# Patient Record
Sex: Male | Born: 1950 | Race: White | Hispanic: No | Smoking: Former smoker
Health system: Southern US, Community
[De-identification: ages and names within clinical notes are randomized; demographics above are authoritative.]

## PROBLEM LIST (undated history)

## (undated) DIAGNOSIS — J449 Chronic obstructive pulmonary disease, unspecified: Secondary | ICD-10-CM

## (undated) DIAGNOSIS — C801 Malignant (primary) neoplasm, unspecified: Secondary | ICD-10-CM

## (undated) DIAGNOSIS — R079 Chest pain, unspecified: Secondary | ICD-10-CM

## (undated) DIAGNOSIS — R7303 Prediabetes: Secondary | ICD-10-CM

## (undated) DIAGNOSIS — B1920 Unspecified viral hepatitis C without hepatic coma: Secondary | ICD-10-CM

## (undated) DIAGNOSIS — R0600 Dyspnea, unspecified: Secondary | ICD-10-CM

## (undated) DIAGNOSIS — R51 Headache: Secondary | ICD-10-CM

## (undated) DIAGNOSIS — I2699 Other pulmonary embolism without acute cor pulmonale: Secondary | ICD-10-CM

## (undated) DIAGNOSIS — F419 Anxiety disorder, unspecified: Secondary | ICD-10-CM

## (undated) DIAGNOSIS — R011 Cardiac murmur, unspecified: Secondary | ICD-10-CM

## (undated) DIAGNOSIS — I10 Essential (primary) hypertension: Secondary | ICD-10-CM

## (undated) DIAGNOSIS — G4730 Sleep apnea, unspecified: Secondary | ICD-10-CM

## (undated) DIAGNOSIS — T7840XA Allergy, unspecified, initial encounter: Secondary | ICD-10-CM

## (undated) DIAGNOSIS — F32A Depression, unspecified: Secondary | ICD-10-CM

## (undated) DIAGNOSIS — M1990 Unspecified osteoarthritis, unspecified site: Secondary | ICD-10-CM

## (undated) DIAGNOSIS — J189 Pneumonia, unspecified organism: Secondary | ICD-10-CM

## (undated) DIAGNOSIS — K219 Gastro-esophageal reflux disease without esophagitis: Secondary | ICD-10-CM

## (undated) HISTORY — DX: Other pulmonary embolism without acute cor pulmonale: I26.99

## (undated) HISTORY — DX: Chest pain, unspecified: R07.9

## (undated) HISTORY — DX: Sleep apnea, unspecified: G47.30

## (undated) HISTORY — DX: Unspecified viral hepatitis C without hepatic coma: B19.20

## (undated) HISTORY — DX: Headache: R51

## (undated) HISTORY — PX: JOINT REPLACEMENT: SHX530

## (undated) HISTORY — DX: Allergy, unspecified, initial encounter: T78.40XA

## (undated) HISTORY — DX: Essential (primary) hypertension: I10

## (undated) HISTORY — DX: Malignant (primary) neoplasm, unspecified: C80.1

## (undated) HISTORY — DX: Pneumonia, unspecified organism: J18.9

## (undated) HISTORY — PX: OTHER SURGICAL HISTORY: SHX169

---

## 2000-10-07 HISTORY — PX: LAMINECTOMY: SHX219

## 2001-01-07 HISTORY — PX: LUMBAR FUSION: SHX111

## 2001-12-28 HISTORY — PX: OTHER SURGICAL HISTORY: SHX169

## 2002-02-17 HISTORY — PX: OTHER SURGICAL HISTORY: SHX169

## 2002-09-02 DIAGNOSIS — J189 Pneumonia, unspecified organism: Secondary | ICD-10-CM

## 2002-09-02 HISTORY — DX: Pneumonia, unspecified organism: J18.9

## 2003-04-18 HISTORY — PX: OTHER SURGICAL HISTORY: SHX169

## 2003-07-24 DIAGNOSIS — B1920 Unspecified viral hepatitis C without hepatic coma: Secondary | ICD-10-CM

## 2003-07-24 HISTORY — DX: Unspecified viral hepatitis C without hepatic coma: B19.20

## 2003-12-12 HISTORY — PX: OTHER SURGICAL HISTORY: SHX169

## 2004-06-18 HISTORY — PX: OTHER SURGICAL HISTORY: SHX169

## 2006-08-27 HISTORY — PX: OTHER SURGICAL HISTORY: SHX169

## 2006-12-07 DIAGNOSIS — I2699 Other pulmonary embolism without acute cor pulmonale: Secondary | ICD-10-CM

## 2006-12-07 HISTORY — DX: Other pulmonary embolism without acute cor pulmonale: I26.99

## 2007-01-22 DIAGNOSIS — R079 Chest pain, unspecified: Secondary | ICD-10-CM

## 2007-01-22 HISTORY — DX: Chest pain, unspecified: R07.9

## 2007-07-02 DIAGNOSIS — R519 Headache, unspecified: Secondary | ICD-10-CM

## 2007-07-02 HISTORY — DX: Headache, unspecified: R51.9

## 2007-07-23 HISTORY — PX: UPPER GI ENDOSCOPY: SHX6162

## 2007-09-07 HISTORY — PX: OTHER SURGICAL HISTORY: SHX169

## 2007-11-20 HISTORY — PX: COLONOSCOPY: SHX174

## 2008-01-29 HISTORY — PX: OTHER SURGICAL HISTORY: SHX169

## 2008-09-19 HISTORY — PX: OTHER SURGICAL HISTORY: SHX169

## 2010-02-20 HISTORY — PX: EPIDURAL BLOCK INJECTION: SHX1516

## 2010-06-11 HISTORY — PX: OTHER SURGICAL HISTORY: SHX169

## 2013-12-02 HISTORY — PX: OTHER SURGICAL HISTORY: SHX169

## 2013-12-23 HISTORY — PX: LOBECTOMY: SHX5089

## 2015-11-12 ENCOUNTER — Other Ambulatory Visit
Admission: RE | Admit: 2015-11-12 | Discharge: 2015-11-12 | Disposition: A | Discharge status: Home / Self Care | Payer: Worker's Compensation | Source: Ambulatory Visit | Attending: Pain Medicine | Admitting: Pain Medicine

## 2015-11-12 ENCOUNTER — Ambulatory Visit
Admission: RE | Admit: 2015-11-12 | Discharge: 2015-11-12 | Disposition: A | Discharge status: Home / Self Care | Payer: Worker's Compensation | Source: Ambulatory Visit | Attending: Pain Medicine | Admitting: Pain Medicine

## 2015-11-12 ENCOUNTER — Other Ambulatory Visit: Payer: Self-pay

## 2015-11-12 ENCOUNTER — Encounter: Payer: Self-pay | Admitting: Pain Medicine

## 2015-11-12 ENCOUNTER — Ambulatory Visit: Payer: Worker's Compensation | Attending: Pain Medicine | Admitting: Pain Medicine

## 2015-11-12 VITALS — BP 144/57 | HR 73 | Temp 98.2°F | Resp 16 | Ht 65.0 in | Wt 218.0 lb

## 2015-11-12 DIAGNOSIS — M539 Dorsopathy, unspecified: Secondary | ICD-10-CM | POA: Diagnosis not present

## 2015-11-12 DIAGNOSIS — Z87898 Personal history of other specified conditions: Secondary | ICD-10-CM | POA: Insufficient documentation

## 2015-11-12 DIAGNOSIS — M545 Low back pain, unspecified: Secondary | ICD-10-CM

## 2015-11-12 DIAGNOSIS — Z9889 Other specified postprocedural states: Secondary | ICD-10-CM | POA: Diagnosis not present

## 2015-11-12 DIAGNOSIS — J449 Chronic obstructive pulmonary disease, unspecified: Secondary | ICD-10-CM | POA: Diagnosis not present

## 2015-11-12 DIAGNOSIS — Z79891 Long term (current) use of opiate analgesic: Secondary | ICD-10-CM | POA: Diagnosis not present

## 2015-11-12 DIAGNOSIS — Z811 Family history of alcohol abuse and dependence: Secondary | ICD-10-CM | POA: Insufficient documentation

## 2015-11-12 DIAGNOSIS — Z79899 Other long term (current) drug therapy: Secondary | ICD-10-CM | POA: Insufficient documentation

## 2015-11-12 DIAGNOSIS — M4806 Spinal stenosis, lumbar region: Secondary | ICD-10-CM | POA: Insufficient documentation

## 2015-11-12 DIAGNOSIS — M5441 Lumbago with sciatica, right side: Secondary | ICD-10-CM

## 2015-11-12 DIAGNOSIS — R0902 Hypoxemia: Secondary | ICD-10-CM | POA: Diagnosis not present

## 2015-11-12 DIAGNOSIS — M5416 Radiculopathy, lumbar region: Secondary | ICD-10-CM | POA: Insufficient documentation

## 2015-11-12 DIAGNOSIS — Z9981 Dependence on supplemental oxygen: Secondary | ICD-10-CM | POA: Insufficient documentation

## 2015-11-12 DIAGNOSIS — I10 Essential (primary) hypertension: Secondary | ICD-10-CM | POA: Diagnosis not present

## 2015-11-12 DIAGNOSIS — M961 Postlaminectomy syndrome, not elsewhere classified: Secondary | ICD-10-CM | POA: Insufficient documentation

## 2015-11-12 DIAGNOSIS — B1920 Unspecified viral hepatitis C without hepatic coma: Secondary | ICD-10-CM | POA: Diagnosis not present

## 2015-11-12 DIAGNOSIS — Z86711 Personal history of pulmonary embolism: Secondary | ICD-10-CM | POA: Diagnosis not present

## 2015-11-12 DIAGNOSIS — Z978 Presence of other specified devices: Secondary | ICD-10-CM | POA: Insufficient documentation

## 2015-11-12 DIAGNOSIS — I708 Atherosclerosis of other arteries: Secondary | ICD-10-CM | POA: Diagnosis not present

## 2015-11-12 DIAGNOSIS — M4326 Fusion of spine, lumbar region: Secondary | ICD-10-CM | POA: Insufficient documentation

## 2015-11-12 DIAGNOSIS — G894 Chronic pain syndrome: Secondary | ICD-10-CM | POA: Diagnosis not present

## 2015-11-12 DIAGNOSIS — M2578 Osteophyte, vertebrae: Secondary | ICD-10-CM | POA: Diagnosis not present

## 2015-11-12 DIAGNOSIS — I700 Atherosclerosis of aorta: Secondary | ICD-10-CM | POA: Insufficient documentation

## 2015-11-12 DIAGNOSIS — Z8659 Personal history of other mental and behavioral disorders: Secondary | ICD-10-CM

## 2015-11-12 DIAGNOSIS — M79604 Pain in right leg: Secondary | ICD-10-CM | POA: Insufficient documentation

## 2015-11-12 DIAGNOSIS — Z462 Encounter for fitting and adjustment of other devices related to nervous system and special senses: Secondary | ICD-10-CM

## 2015-11-12 DIAGNOSIS — N529 Male erectile dysfunction, unspecified: Secondary | ICD-10-CM | POA: Diagnosis not present

## 2015-11-12 DIAGNOSIS — F329 Major depressive disorder, single episode, unspecified: Secondary | ICD-10-CM | POA: Insufficient documentation

## 2015-11-12 DIAGNOSIS — F410 Panic disorder [episodic paroxysmal anxiety] without agoraphobia: Secondary | ICD-10-CM | POA: Diagnosis not present

## 2015-11-12 DIAGNOSIS — Z888 Allergy status to other drugs, medicaments and biological substances status: Secondary | ICD-10-CM | POA: Diagnosis not present

## 2015-11-12 DIAGNOSIS — F32A Depression, unspecified: Secondary | ICD-10-CM

## 2015-11-12 DIAGNOSIS — G8929 Other chronic pain: Secondary | ICD-10-CM | POA: Insufficient documentation

## 2015-11-12 DIAGNOSIS — Z87891 Personal history of nicotine dependence: Secondary | ICD-10-CM | POA: Diagnosis not present

## 2015-11-12 DIAGNOSIS — F1190 Opioid use, unspecified, uncomplicated: Secondary | ICD-10-CM | POA: Insufficient documentation

## 2015-11-12 DIAGNOSIS — Z9689 Presence of other specified functional implants: Secondary | ICD-10-CM | POA: Diagnosis not present

## 2015-11-12 DIAGNOSIS — G4700 Insomnia, unspecified: Secondary | ICD-10-CM | POA: Insufficient documentation

## 2015-11-12 DIAGNOSIS — Z451 Encounter for adjustment and management of infusion pump: Secondary | ICD-10-CM | POA: Insufficient documentation

## 2015-11-12 LAB — COMPREHENSIVE METABOLIC PANEL
ALBUMIN: 4 g/dL (ref 3.5–5.0)
ALT: 18 U/L (ref 17–63)
AST: 20 U/L (ref 15–41)
Alkaline Phosphatase: 51 U/L (ref 38–126)
Anion gap: 8 (ref 5–15)
BILIRUBIN TOTAL: 0.4 mg/dL (ref 0.3–1.2)
BUN: 15 mg/dL (ref 6–20)
CHLORIDE: 101 mmol/L (ref 101–111)
CO2: 29 mmol/L (ref 22–32)
CREATININE: 0.83 mg/dL (ref 0.61–1.24)
Calcium: 8.9 mg/dL (ref 8.9–10.3)
GFR calc Af Amer: >60 mL/min (ref >60)
GLUCOSE: 94 mg/dL (ref 65–99)
POTASSIUM: 3.9 mmol/L (ref 3.5–5.1)
Sodium: 138 mmol/L (ref 135–145)
Total Protein: 7.3 g/dL (ref 6.5–8.1)

## 2015-11-12 LAB — C-REACTIVE PROTEIN: CRP: 0.9 mg/dL (ref <1.0)

## 2015-11-12 LAB — MAGNESIUM: Magnesium: 2 mg/dL (ref 1.7–2.4)

## 2015-11-12 LAB — SEDIMENTATION RATE: SED RATE: 14 mm/h (ref 0–20)

## 2015-11-12 LAB — VITAMIN B12: Vitamin B-12: 1143 pg/mL — ABNORMAL HIGH (ref 180–914)

## 2015-11-12 MED ORDER — PAIN MANAGEMENT IT PUMP REFILL: 1.0000 | Freq: Once | INTRATHECAL | Status: DC

## 2015-11-12 MED ORDER — PAIN MANAGEMENT IT PUMP REFILL: 1.0000 | Freq: Once | INTRATHECAL | 0 refills | Status: DC

## 2015-11-12 NOTE — Patient Instructions (Signed)
Please get your labs and x-rays done as soon as possible. 

## 2015-11-12 NOTE — Progress Notes (Signed)
Patient's Name: Brian Spencer  MRN: [id number]  Referring Provider: No ref. provider found  DOB: 1950-04-30  PCP: No primary care provider on file.  DOS: 11/12/2015  Note by: Kathlen Brunswick. Dossie Arbour, MD  Service setting: Ambulatory outpatient  Specialty: Interventional Pain Management  Location: [facility] (AMB) Pain Management Facility    Patient type: New patient   Primary Reason(s) for Visit: Initial Patient Evaluation CC: Back Pain (lower) and Leg Pain (right  side of leg to the front of the leg)  HPI  Mr. Brian Spencer is a 65 y.o. year old, male patient, who comes today for an initial evaluation. 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 His primarily concern today is the Back Pain (lower) and Leg Pain (right  side of leg to the front of the leg)  Pain Assessment: Self-Reported Pain Score: 5 /10 Clinically the patient looks like a 2/10 Reported level is inconsistent with clinical observations. Information on the proper use of the pain score provided to the patient today. Pain Type: Chronic pain Pain Location: Back Pain Orientation: Lower Pain Descriptors / Indicators: Aching, Constant, Radiating, Sharp (patient has intrathecal pump) Pain Frequency: Constant  Onset and Duration: Sudden,  Started with accident, Date of onset: 05/19/2000, Date of injury: 15 years ago and Present longer than 3 months Cause of pain: Work related accident or event. 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 Severity: No change since onset, NAS-11 at its worse: 10/10, NAS-11 at its best: 5/10, NAS-11 now: 5/10 and NAS-11 on the average: 5/10 Timing: Morning, Afternoon, Night, During activity or exercise, After activity or exercise and After a period of immobility Aggravating Factors: Bending, Lifiting, Motion, Prolonged sitting, Prolonged standing and Surgery made it worse Alleviating Factors: Hot packs, Lying down, Medications, Resting, TENS and Warm showers or baths Associated Problems: Depression, Erectile dysfunction, Fatigue, Inability to concentrate, Sadness, Pain that wakes patient up and Pain that does not allow patient to sleep Quality of Pain: Aching, Constant, Deep, Sharp, Shooting and Stabbing Previous Examinations or Tests: CT scan Previous Treatments: Facet blocks and Morphine pump  The patient comes into the clinics today for the first time for a chronic pain management evaluation. He indicates that worst pain to be that of the lower back with the right side being worst on the left. The second worst pain is that of his right lower extremity where the pain goes down to the ankle and from the ankle down it is non-over the top of the foot into the area of the big toe following an L5 dermatomal distribution. The patient indicates that prior to the surgery he had a "foot drop" which did improve after the surgery. He indicates having had 2 back surgeries the first one he none July 2002 where he had a laminectomy and then on November 2002 he underwent an anterior and posterior fusion. His pain has been successfully managed with a intrathecal  pump. The first pump was implanted in 2008 and has been replaced twice with the last one having been on October 2015. Currently the pump is running preservative-free  hydromorphone 25  Mg/mL at a simple continuous rate of 8.01 mg/day along with clonidine, which the pump has at a concentration of 400 g per mL and runs at 128.17 g per day. This concentration of hydromorphone is 2.5 times higher than the maximum recommended of 10  Mg/mL. In addition, his total daily dose is twice the recommended. His pump is set up with a patient activated dose of 0.500 mg per dose of preservative-free hydromorphone with a total maximum daily dose of 9.477 mg per day. This also delivers preservative-free clonidine 8.00 g per dose with a total of 151.64 mcg/day. The duration of the bolus is 2 minutes with a lockout of 2 hours and a dose restriction interval of 1 per every 2 hours with a maximum of 3 activations per day. The patient indicates that he seldom uses this modality and he would not mind if we took it away. I have explained to him that the downside of use in this modality is that it makes the refill date on predictable.  Today I took the time to provide the patient with information regarding my pain practice. The patient was informed that my practice is divided into two sections: an interventional pain management section, as well as a completely separate and distinct medication management section. The interventional portion of my practice takes place on Tuesdays and Thursdays, while the medication management is conducted on Mondays and Wednesdays. Because of the amount of documentation required on both them, they are kept separated. This means that there is the possibility that the patient may be scheduled for a procedure on Tuesday, while also having a medication management appointment on Wednesday. I have also informed the patient that because of current staffing and facility limitations, I no longer take patients for medication management only. To illustrate the reasons for this, I gave the patient the example of a surgeon and how inappropriate it would be to refer a patient to  his/her practice so that they write for the post-procedure antibiotics on a surgery done by someone else.   The patient was informed that joining my practice means that they are open to any and all interventional therapies. I clarified for the patient that this does not mean that they will be forced to have any procedures done. What it means is that patients looking for a practitioner to simply write for their pain medications and not take advantage of other interventional techniques will be better served by a different practitioner, other than myself. I made it clear that I prefer to spend my time providing those services that I specialize in.  The patient was also made aware of my Comprehensive Pain Management Safety Guidelines where by joining my practice, they limit all of their nerve blocks and joint injections to those done by our practice, for as long as we are retained to manage their care.   Historic Controlled Substance Pharmacotherapy Review  Analgesic: 8.010 mg of preservative-free hydromorphone per day, intrathecally. (This is equivalent to 200.25 mg/day of oral hydromorphone) Medications: Not applicable MME/day: [id number] mg/day Pharmacodynamics: Analgesic Effect: More than 50% Activity Facilitation: Medication(s) allow patient to sit, stand, walk, and do the basic ADLs Perceived Effectiveness: Described as relatively effective, allowing for increase in activities of daily living (ADL) Side-effects or Adverse reactions: None reported Historical Background Evaluation: [place] PDMP:  Five (5) year initial data search conducted. No abnormal patterns identified [place] [employer] Offender Public Information: Non-contributory UDS Results: No UDS results available at this time UDS Interpretation: N/A Medication Assessment Form: Not applicable. Initial evaluation. The patient has not received any medications from our practice Treatment compliance: Not applicable. Initial evaluation Risk  Assessment: Aberrant Behavior: None observed or detected today Opioid Fatal Overdose Risk Factors: None identified today Non-fatal overdose hazard ratio (HR): Calculation deferred Fatal overdose hazard ratio (HR): Calculation deferred Substance Use Disorder (SUD) Risk Level: Pending results of Medical Psychology Evaluation for SUD Opioid Risk Tool (ORT) Score: Total Score: 4 Moderate Risk for SUD (Score between 4-7) Depression Scale Score: PHQ-2: PHQ-2 Total Score: 0 No depression (0) PHQ-9: PHQ-9 Total Score: 0 No depression (0-4)  Pharmacologic Plan: Pending ordered tests and/or consults  Historical Illicit Drug Screen Labs(s): No results found for: MDMA, COCAINSCRNUR, PCPSCRNUR, THCU, ETH  Meds  The patient has a current medication list which includes the following prescription(s): albuterol, budesonide-formoterol, chlorthalidone, clonazepam, coenzyme q10, multivitamin, PAIN MANAGEMENT IT PUMP REFILL, tamsulosin, tiotropium, trazodone, venlafaxine xr, verapamil, and ergocalciferol.  No current outpatient prescriptions on file prior to visit.   No current facility-administered medications on file prior to visit.     Imaging Review  Note: No results found under the [facility] electronic medical record  ROS  Cardiovascular History: Negative for hypertension, coronary artery diseas, myocardial infraction, anticoagulant therapy or heart failure Pulmonary or Respiratory History: Lung problems and Shortness of breath Neurological History: Negative for epilepsy, stroke, urinary or fecal inontinence, spina bifida or tethered cord syndrome Review of Past Neurological Studies: No results found for this or any previous visit. Psychological-Psychiatric History: Depression, Panic Attacks and Insomnia Gastrointestinal History: Negative for peptic ulcer disease, hiatal hernia, GERD, IBS, hepatitis, cirrhosis or pancreatitis Genitourinary History: Negative for nephrolithiasis, hematuria,  renal failure or chronic kidney disease Hematological History: Negative for anticoagulant therapy, anemia, bruising or bleeding easily, hemophilia, sickle cell disease or trait, thrombocytopenia or coagulupathies Endocrine History: Negative for diabetes or thyroid disease Rheumatologic History: Rheumatoid arthritis Musculoskeletal History: Negative for myasthenia gravis, muscular dystrophy, multiple sclerosis or malignant hyperthermia Work History: Out of work due to pain since 05/19/2000.  Allergies  Mr. Guba is allergic to baclofen.  Laboratory Chemistry  Inflammation Markers No results found for: ESRSEDRATE, CRP Renal Function No results found for: BUN, CREATININE, GFRAA, GFRNONAA Hepatic Function No results found for: AST, ALT, ALBUMIN Electrolytes No results found for: NA, K, CL, CALCIUM, MG Pain Modulating Vitamins No results found for: [place], [id number], [id number], [id number], 25OHVITD1, 25OHVITD2, 25OHVITD3, VITAMINB12 Coagulation Parameters No results found for: INR, LABPROT, APTT, PLT Cardiovascular No results found for: BNP, HGB, HCT  Note: No results found under the [employer] electronic medical record  [facility] [address]  Medical:  Mr. Bernhard  has a past medical history of Allergy; Apnea, sleep; Cancer ([place]); Chest pain (01/22/2007); Hepatitis C virus (07/24/2003); Hypertension; Pneumonia (09/02/2002); Pulmonary embolism ([place]) (12/07/2006); and Severe frontal headaches (07/02/2007). Family: family history includes COPD in his mother. Surgical:  has a past surgical history that includes Laminectomy (10/07/2000); Lumbar fusion (01/07/2001); Insertion of Medtronic  Spinal Cord Stimulator (12/28/2001); REmoval of Spinal cord stimulator; bone spurs removal (Bilateral, 2004); Rotary cuff surgery (Right, 04/18/2003); arthroscopic knee surgery (Left, 12/12/2003); Joint replacement; left total knee replacement (Left, 06/18/2004); medtronic pain infusion pump implanted  (08/27/2006); Upper  gi endoscopy (07/23/2007); bone spurs (Left, 09/07/2007); Colonoscopy (11/20/2007); chest pain  (01/29/2008); medtronic pump  stopped working (09/19/2008); Epidural  block injection (02/20/2010); Implant 2, medtronic   bladder stimulators (06/11/2010); medtronic pain pump stimulator  (12/02/2013); and Lobectomy (Right, 12/23/2013). Tobacco:  reports that he quit smoking about 2 years ago. His smoking use included Cigarettes. He has a 30.00 pack-year smoking history. He has never used smokeless tobacco. Alcohol:  reports that he drinks about 1.2 oz of alcohol per week . Drug:  reports that he does not use drugs. Active Ambulatory Problems    Diagnosis Date Noted  . Chronic pain 11/12/2015  . Long term current use of opiate analgesic 11/12/2015  . Long term prescription opiate use 11/12/2015  . Opiate use (801 MME/Day) 11/12/2015  . Failed back surgical syndrome 11/12/2015  . Chronic low back pain (Location of Primary Source of Pain) (Bilateral) (R>L) 11/12/2015  . Chronic lower extremity pain (Location of Secondary source of pain) (Right) 11/12/2015  . Chronic lumbar radicular pain (L5 dermatome) (Location of Secondary source of pain) (Right) 11/12/2015  . Encounter for interrogation of infusion pump 11/12/2015  . Presence of intrathecal pump 11/12/2015  . Chronic pain syndrome 11/12/2015  . COPD with hypoxia ([place]) (on supplemental oxygen) 11/12/2015  . History of shortness of breath 11/12/2015  . Dependence on supplemental oxygen 11/12/2015  . Depression 11/12/2015  . History of panic attacks 11/12/2015  . Insomnia 11/12/2015  . Fusion of lumbar spine 11/12/2015  . History of lumbar laminectomy 11/12/2015  . Family history of alcoholism 11/12/2015   Resolved Ambulatory Problems    Diagnosis Date Noted  . No Resolved Ambulatory Problems   Past Medical History:  Diagnosis Date  . Allergy   . Apnea, sleep   . Cancer ([place])   . Chest pain 01/22/2007  . Hepatitis C virus 07/24/2003  .  Hypertension   . Pneumonia 09/02/2002  . Pulmonary embolism ([place]) 12/07/2006  . Severe frontal headaches 07/02/2007    Constitutional Exam  General appearance: Well nourished, well developed, and well hydrated. In no acute distress Vitals:   11/12/15 1338  BP: (!) 144/57  Pulse: 73  Resp: 16  Temp: 98.2 F (36.8 C)  TempSrc: Oral  SpO2: 100%  Weight: 218 lb (98.9 kg)  Height: 5\' 5"  (1.651 m)  BMI Assessment: Estimated body mass index is 36.28 kg/m as calculated from the following:   Height as of this encounter: 5\' 5"  (1.651 m).   Weight as of this encounter: 218 lb (98.9 kg).   BMI interpretation: (35-39.9 kg/m2) = Severe obesity (Class II): This range is associated with a 136% higher incidence of chronic pain. BMI Readings from Last 4 Encounters:  11/12/15 36.28 kg/m   Wt Readings from Last 4 Encounters:  11/12/15 218 lb (98.9 kg)  Psych/Mental status: Alert and oriented x 3 (person, place, & time) Eyes: PERLA Respiratory: No evidence of acute respiratory distress  Cervical Spine Exam  Inspection: No masses, redness, or swelling Alignment: Symmetrical Functional ROM: Unrestricted ROM Stability: No instability detected Muscle strength & Tone: Functionally intact Sensory: Unimpaired Palpation: Non-contributory  Upper Extremity (UE) Exam    Side: Right upper extremity  Side: Left upper extremity  Inspection: No masses, redness, swelling, or asymmetry  Inspection: No masses, redness, swelling, or asymmetry  Functional ROM: Unrestricted ROM         Functional ROM: Unrestricted ROM          Muscle strength & Tone: Functionally intact  Muscle strength & Tone: Functionally intact  Sensory: Unimpaired  Sensory: Unimpaired  Palpation: Non-contributory  Palpation: Non-contributory   Thoracic Spine  Exam  Inspection: No masses, redness, or swelling Alignment: Symmetrical Functional ROM: Unrestricted ROM Stability: No instability detected Sensory: Unimpaired Muscle  strength & Tone: Functionally intact Palpation: Non-contributory  Lumbar Spine Exam  Inspection: Well healed scar from previous spine surgery detected Alignment: Symmetrical Functional ROM: Fused Stability: No instability detected Muscle strength & Tone: Functionally intact Sensory: Movement-associated pain Palpation: Complains of area being tender to palpation Provocative Tests: Lumbar Hyperextension and rotation test: evaluation deferred today       Patrick's Maneuver: evaluation deferred today              Gait & Posture Assessment  Ambulation: Patient ambulates using a cane Gait: Antalgic Posture: Antalgic   Lower Extremity Exam    Side: Right lower extremity  Side: Left lower extremity  Inspection: No masses, redness, swelling, or asymmetry  Inspection: No masses, redness, swelling, or asymmetry  Functional ROM: Unrestricted ROM          Functional ROM: Unrestricted ROM          Muscle strength & Tone: Functionally intact  Muscle strength & Tone: Functionally intact  Sensory: Unimpaired  Sensory: Unimpaired  Palpation: Non-contributory  Palpation: Non-contributory    Assessment  Primary Diagnosis & Pertinent Problem List: The primary encounter diagnosis was Chronic pain. 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  Visit Diagnosis: 1. Chronic pain   2. Long term current use of opiate analgesic   3. Long term prescription opiate use   4. Failed back surgical syndrome   5. Chronic low back pain (Location of Primary Source of Pain) (Bilateral) (R>L)   6. Chronic lower extremity pain (Location of Secondary source of pain) (Right)   7. Chronic lumbar radicular pain (L5 dermatome) (Location of Secondary source of pain) (Right)   8. Encounter for interrogation of infusion pump   9. Presence of intrathecal pump   10. Chronic pain syndrome   11. Opiate use (801 MME/Day)   12. COPD with hypoxia ([place]) (on supplemental oxygen)   13. History of shortness of breath   14. Dependence on supplemental oxygen   15. Depression   16. History of panic attacks   17. Insomnia   18. Fusion of lumbar spine   19. History of lumbar laminectomy   20. Family history of alcoholism    Plan of Care  Initial Treatment Plan:  Please be advised that as per protocol, today's visit has been an evaluation only. We have not taken over the patient's controlled substance management.  Problem-Specific Plan: No problem-specific Assessment & Plan notes found for this encounter.  Ordered Lab-work, Procedure(s), & Referral(s) : Orders Placed This Encounter  Procedures  . DG Lumbar Spine Complete W/Bend  . DG Thoracic Spine 2 View  . Compliance Drug Analysis, Ur  . Comprehensive metabolic panel  . C-reactive protein  . Magnesium  . Sedimentation rate  . Vitamin  B12  . 25-Hydroxyvitamin D Lcms D2+D3  . Ambulatory referral to Psychology  Referral(s) or Consult(s): Medical psychology consult for substance use disorder evaluation Pharmacotherapy: Medications ordered:  Meds ordered this encounter  Medications  . PAIN MANAGEMENT IT PUMP REFILL    Sig: 1 each by Intrathecal route once. Medication: Opioid: PF-Hydromorphone 25 mg/mL Adjuvant: PF-Clonidine 400  mcg/mL Volume: 40 ml Appointment Date: 11/20/15 @ 12:00noon    Dispense:  1 each    Refill:  0    To be used as a refill for the intrathecal pumps by the pain management staff.   Prescriptions ordered during this visit: New Prescriptions   PAIN MANAGEMENT IT PUMP REFILL    1 each by Intrathecal route once. Medication: Opioid: PF-Hydromorphone 25 mg/mL Adjuvant: PF-Clonidine 400 mcg/mL Volume: 40 ml Appointment Date: 11/20/15 @ 12:00noon   Medications administered during this visit: Mr. Gagnier had no medications administered during this visit.   Pharmacotherapy plan under consideration:  Continue with the intrathecal pump and perhaps add local anesthetics to it.    Interventional Therapies: Interventional procedures under consideration:  Diagnostic bilateral lumbar facet blocks under fluoroscopic guidance and IV sedation.    Requested PM Follow-up: Return for Pump Refill (as per program).  Future Appointments Date Time Provider Weston Penaloza  11/20/2015 1:00 PM Milinda Pointer, MD [facility] None    Primary Care Physician: No primary care provider on file. Location: [facility] Note by: Bernerd Terhune A. Dossie Arbour, M.D, DABA, DABAPM, DABPM, DABIPP, FIPP  Pain Score Disclaimer: We use the NRS-11 scale. This is a self-reported, subjective measurement of pain severity with only modest accuracy. It is used primarily to identify changes within a particular patient. It must be understood that outpatient pain scales are significantly less accurate that those used for research, where they can be applied under ideal controlled circumstances with minimal exposure to variables. In reality, the score is likely to be a combination of pain intensity and pain affect, where pain affect describes the degree of emotional arousal or changes in action readiness caused by the sensory experience of pain. Factors such as social and work situation, setting, emotional state,  anxiety levels, expectation, and prior pain experience may influence pain perception and show large inter-individual differences that may also be affected by time variables.  Patient instructions provided during this appointment: Patient Instructions  Please get your labs and x-rays done as soon as possible

## 2015-11-12 NOTE — Progress Notes (Signed)
Safety precautions to be maintained throughout the outpatient stay will include: orient to surroundings, keep bed in low position, maintain call bell within reach at all times, provide assistance with transfer out of bed and ambulation. New patient with his third intrathecal pump.

## 2015-11-15 LAB — 25-HYDROXY VITAMIN D LCMS D2+D3
25-Hydroxy, Vitamin D-3: 38 ng/mL
25-Hydroxy, Vitamin D: 39 ng/mL

## 2015-11-15 LAB — 25-HYDROXYVITAMIN D LCMS D2+D3

## 2015-11-18 LAB — COMPLIANCE DRUG ANALYSIS, UR

## 2015-11-20 ENCOUNTER — Encounter: Payer: Self-pay | Admitting: Pain Medicine

## 2015-11-20 ENCOUNTER — Ambulatory Visit: Payer: Worker's Compensation | Attending: Pain Medicine | Admitting: Pain Medicine

## 2015-11-20 VITALS — BP 128/57 | HR 58 | Temp 98.0°F | Resp 20 | Ht 65.0 in | Wt 220.0 lb

## 2015-11-20 DIAGNOSIS — Z9689 Presence of other specified functional implants: Secondary | ICD-10-CM

## 2015-11-20 DIAGNOSIS — M961 Postlaminectomy syndrome, not elsewhere classified: Secondary | ICD-10-CM | POA: Diagnosis not present

## 2015-11-20 DIAGNOSIS — Z79891 Long term (current) use of opiate analgesic: Secondary | ICD-10-CM | POA: Insufficient documentation

## 2015-11-20 DIAGNOSIS — Z9981 Dependence on supplemental oxygen: Secondary | ICD-10-CM | POA: Insufficient documentation

## 2015-11-20 DIAGNOSIS — M5416 Radiculopathy, lumbar region: Secondary | ICD-10-CM | POA: Diagnosis not present

## 2015-11-20 DIAGNOSIS — G8929 Other chronic pain: Secondary | ICD-10-CM | POA: Diagnosis not present

## 2015-11-20 DIAGNOSIS — F1190 Opioid use, unspecified, uncomplicated: Secondary | ICD-10-CM | POA: Diagnosis not present

## 2015-11-20 DIAGNOSIS — Z978 Presence of other specified devices: Secondary | ICD-10-CM

## 2015-11-20 DIAGNOSIS — M79604 Pain in right leg: Secondary | ICD-10-CM | POA: Insufficient documentation

## 2015-11-20 DIAGNOSIS — M545 Low back pain: Secondary | ICD-10-CM | POA: Diagnosis present

## 2015-11-20 DIAGNOSIS — F410 Panic disorder [episodic paroxysmal anxiety] without agoraphobia: Secondary | ICD-10-CM | POA: Diagnosis not present

## 2015-11-20 DIAGNOSIS — F329 Major depressive disorder, single episode, unspecified: Secondary | ICD-10-CM | POA: Insufficient documentation

## 2015-11-20 DIAGNOSIS — Z462 Encounter for fitting and adjustment of other devices related to nervous system and special senses: Secondary | ICD-10-CM

## 2015-11-20 DIAGNOSIS — J449 Chronic obstructive pulmonary disease, unspecified: Secondary | ICD-10-CM | POA: Insufficient documentation

## 2015-11-20 DIAGNOSIS — G894 Chronic pain syndrome: Secondary | ICD-10-CM | POA: Insufficient documentation

## 2015-11-20 DIAGNOSIS — Z451 Encounter for adjustment and management of infusion pump: Secondary | ICD-10-CM

## 2015-11-20 NOTE — Progress Notes (Signed)
Patient's Name: Brian Spencer  MRN: [id number]  Referring Provider: No ref. provider found  DOB: [birth year]  PCP: No primary care provider on file.  DOS: 11/20/2015  Note by: Kathlen Brunswick. Dossie Arbour, MD  Service setting: Ambulatory outpatient  Location: [facility] (AMB) [facility]  Visit type: Procedure  Specialty: Interventional Pain Management  Patient type: Established   Primary Reason for Visit: Interventional Pain Management Treatment. CC: Back Pain (lower) and Leg Pain (right)  Procedure:  Intrathecal Drug Delivery System (IDDS):  Type: Reservoir Refill [phone]) No rate change Region: Abdominal Laterality: Right  Type of Pump: Medtronic Synchromed II (MRI-compatible) Delivery Route: Intrathecal Type of Pain Treated: Neuropathic/Nociceptive Primary Medication Class: Opioid/opiate  Medication, Concentration, Infusion Program, & Delivery Rate: Please see scanned programming printout.   Indications: 1. Chronic pain syndrome   2. Failed back surgical syndrome   3. Chronic lumbar radicular pain (L5 dermatome) (Location of Secondary source of pain) (Right)   4. Long term current use of opiate analgesic   5. Opiate use (801 MME/Day)   6. Presence of intrathecal pump   7. Encounter for interrogation of infusion pump     Pain Score: Pre-procedure: 5 /10 Post-procedure: 5 /10  Pre-Procedure Assessment:  Brian Spencer is a 65 y.o. year old, male patient, seen today for interventional treatment. 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 His primarily concern today is the Back Pain (lower) and Leg Pain (right)   Pain Type: Chronic pain Pain Location: Back Pain Orientation: Lower Pain Descriptors / Indicators: Aching, Constant, Radiating, Sharp Pain Frequency: Constant  Date of Last Visit: 11/12/15 Service Provided on Last Visit: Evaluation (new patient)  Coagulation Parameters No results found for: INR, LABPROT, APTT, PLT  Verification of the correct person, correct site (including marking of site), and correct procedure were performed and confirmed by the patient.  Consent: Secured. Under the influence of no sedatives a written informed consent was obtained, after having provided information on the risks and possible complications. To fulfill our ethical and legal obligations, as recommended by the American Medical Association's Code of Ethics, we have provided information to the patient about our clinical impression; the nature and purpose of the treatment or procedure; the risks, benefits, and possible complications of the intervention; alternatives; the risk(s) and benefit(s) of the alternative treatment(s) or procedure(s); and the risk(s) and benefit(s) of doing nothing. The patient was provided information about the risks and possible complications associated with the procedure. These include, but are not limited to, failure to achieve desired goals, infection, bleeding, organ or nerve damage, allergic reactions, paralysis, and death. In addition, the patient was informed that Medicine is not an exact science; therefore, there is also the possibility of unforeseen risks and possible complications that may result in a catastrophic outcome. The patient indicated  having understood very clearly. We have given the patient no guarantees and we have made  no promises. Enough time was given to the patient to ask questions, all of which were answered to the patient's satisfaction.  Consent Attestation: I, the ordering provider, attest that I have discussed with the patient the benefits, risks, side-effects, alternatives, likelihood of achieving goals, and potential problems during recovery for the procedure that I have provided informed consent.  Pre-Procedure Preparation: Safety Precautions: Allergies reviewed. Appropriate site, procedure, and patient were confirmed by following the Joint Commission's Universal Protocol (UP.01.01.01), in the form of a "Time Out". The patient was asked to confirm marked site and procedure, before commencing. The patient was asked about blood thinners, or active infections, both of which were denied. Patient was assessed for positional comfort and all pressure points were checked before starting procedure. Infection Control Precautions: Sterile technique used. Standard Universal Precautions were taken as recommended by the Department of [facility] for Disease Control and Prevention (CDC). Standard pre-surgical skin prep was conducted. Respiratory hygiene and cough etiquette was practiced. Hand hygiene observed. Safe injection practices and needle disposal techniques followed. SDV (single dose vial) medications used. Medications properly checked for expiration dates and contaminants. Personal protective equipment (PPE) used: Surgical mask. Sterile Radiation-resistant gloves. Monitoring:  As per clinic protocol. Vitals:   11/20/15 1306 11/20/15 1308  BP:  (!) 128/57  Pulse: (!) 58   Resp: 20   Temp: 98 F (36.7 C)   SpO2: 96%   Weight: 220 lb (99.8 kg)   Height: 5' 5"  (1.651 m)   Calculated BMI: Body mass index is 36.61 kg/m. Allergies: He is allergic to baclofen.. Allergy Precautions: None required  Description of  Procedure Process:   Time-out: "Time-out" completed before starting procedure, as per protocol. Position: Supine Target Area: Central-port of intrathecal pump. Approach: Anterior, 90 degree angle approach. Area Prepped: Entire Area around the pump implant. Prepping solution: ChloraPrep (2% chlorhexidine gluconate and 70% isopropyl alcohol) Safety Precautions: Aspiration looking for blood return was conducted prior to all injections. At no point did we inject any substances, as a needle was being advanced. No attempts were made at seeking any paresthesias. Safe injection practices and needle disposal techniques used. Medications properly checked for expiration dates. SDV (single dose vial) medications used. Description of the Procedure: Protocol guidelines were followed. Two nurses trained to do implant refills were present during the entire procedure. The refill medication was checked by both healthcare providers as well as the patient. The patient was included in the "Time-out" to verify the medication. The patient was placed in position. The pump was identified. The area was prepped in the usual manner. The sterile template was positioned over the pump, making sure the side-port location matched that of the pump. Both, the pump and the template were held for stability. The needle provided in the Medtronic Kit was then introduced thru the center of the template and into the central port. The pump content was aspirated and discarded volume documented. The new medication was slowly infused into the pump, thru the filter, making sure to avoid overpressure of the device. The needle was then removed and the area cleansed, making sure to leave some of the prepping solution back to take advantage of its long term bactericidal properties. The pump was interrogated and programmed to reflect the correct medication, volume, and dosage. The program was printed and taken to the physician for approval. Once checked and  signed by the physician, a copy was provided to the patient and another scanned into the EMR. EBL: None Materials Used: Medtronic  Refill Kit  Imaging Guidance:  Type of Imaging Technique: None required.  Antibiotic Prophylaxis:  Indication(s): No indications identified. Type:  Antibiotics Given (last 72 hours)    None       Post-operative Assessment:  Complications: No immediate post-treatment complications were observed. Disposition: The patient tolerated the entire procedure well. No post-procedural neurological changes observed. The patient was discharged home, once institutional criteria were met. The patient was provided with post-procedure discharge instructions, including a section on how to identify potential problems. Should any problems arise concerning this procedure, the patient was given instructions to immediately contact us, at any time, without hesitation. Comments:  No additional relevant information.  Orders Placed This Encounter  Procedures  . PUMP REFILL    Whenever possible schedule on a procedure today.    Standing Status:   Future    Standing Expiration Date:   04/18/2016    Scheduling Instructions:     Please schedule intrathecal pump refill based on pump programming. Avoid schedule intervals of more than 120 days (4 months).    Order Specific Question:   Where will this procedure be performed?    Answer:   [facility]  . PUMP REPROGRAM    Follow programming protocol by having two(2) healthcare providers present during programming.    Scheduling Instructions:     Please perform the following adjustment: Change the programming on the pump to a simple continuous infusion at exactly the same rate that he had before. Eliminate the option of patient control dosing.    Order Specific Question:   Where will this procedure be performed?    Answer:   [facility]  . PUMP REFILL    Maintain Protocol by having two(2) healthcare providers during  procedure and programming.    Scheduling Instructions:     Please refill intrathecal pump today.    Order Specific Question:   Where will this procedure be performed?    Answer:   [facility]    Medications administered during this visit: Brian Spencer had no medications administered during this visit.  Prescriptions ordered during this visit: No orders of the defined types were placed in this encounter.   Requested PM Follow-up: Return for Pump Refill (as per program).  Future Appointments Date Time Provider [place]  03/05/2016 11:00 AM Milinda Pointer, MD [facility] None    Primary Care Physician: No primary care provider on file. Location: [facility] Note by: Maycol Hoying A. Dossie Arbour, M.D, DABA, DABAPM, DABPM, DABIPP, FIPP  Disclaimer:  Medicine is not an exact science. The only guarantee in medicine is that nothing is guaranteed. It is important to note that the decision to proceed with this intervention was based on the information collected from the patient. The Data and conclusions were drawn from the patient's questionnaire, the interview, and the physical examination. Because the information was provided in large part by the patient, it cannot be guaranteed that it has not been purposely or unconsciously manipulated. Every effort has been made to obtain as much relevant data as possible for this evaluation. It is important to note that the conclusions that lead to this procedure are derived in large part from the available data. Always take into account that the treatment will also be dependent on availability of resources and existing treatment guidelines, considered by other Pain Management Practitioners as being common knowledge and practice, at the time of the intervention. For Medico-Legal purposes, it is also important to point out that  variation in procedural techniques and pharmacological choices are the acceptable norm. The  indications, contraindications, technique, and results of the above procedure should only be interpreted and judged by a Board-Certified Interventional Pain Specialist with extensive familiarity and expertise in the same exact procedure and technique. Attempts at providing opinions without similar or greater experience and expertise than that of the treating physician will be considered as inappropriate and unethical, and shall result in a formal complaint to the state medical board and applicable specialty societies. Pain Score: We use the NRS-11 scale. This is a self-reported, subjective measurement of pain severity with only modest accuracy. It is used primarily to identify changes within a particular patient. It must be understood that outpatient pain scales are significantly less accurate that those used for research, where they can be applied under ideal controlled circumstances with minimal exposure to variables. In reality, the score is likely to be a combination of pain intensity and pain affect, where pain affect describes the degree of emotional arousal or changes in action readiness caused by the sensory experience of pain. Factors such as social and work situation, setting, emotional state, anxiety levels, expectation, and prior pain experience may influence pain perception and show large inter-individual differences that may also be affected by time variables.

## 2015-11-20 NOTE — Progress Notes (Signed)
Safety precautions to be maintained throughout the outpatient stay will include: orient to surroundings, keep bed in low position, maintain call bell within reach at all times, provide assistance with transfer out of bed and ambulation.  

## 2015-11-20 NOTE — Patient Instructions (Signed)
Narcotic Overdose °A narcotic overdose is the misuse or overuse of a narcotic drug. A narcotic overdose can make you pass out and stop breathing. If you are not treated right away, this can cause permanent brain damage or stop your heart. Medicine may be given to reverse the effects of an overdose. If so, this medicine may bring on withdrawal symptoms. The symptoms may be abdominal cramps, throwing up (vomiting), sweating, chills, and nervousness. °Injecting narcotics can cause more problems than just an overdose. AIDS, hepatitis, and other very serious infections are transmitted by sharing needles and syringes. If you decide to quit using, there are medicines which can help you through the withdrawal period. Trying to quit all at once on your own can be uncomfortable, but not life-threatening. Call your caregiver, Narcotics Anonymous, or any drug and alcohol treatment program for further help.  °  °This information is not intended to replace advice given to you by your health care provider. Make sure you discuss any questions you have with your health care provider. °  °Document Released: 03/13/2004 Document Revised: 02/24/2014 Document Reviewed: 07/26/2014 °Elsevier Interactive Patient Education ©2016 [employer] ° °

## 2015-11-21 ENCOUNTER — Telehealth: Payer: Self-pay | Admitting: *Deleted

## 2015-11-21 NOTE — Telephone Encounter (Signed)
Denies complications post procedure. 

## 2015-11-22 MED FILL — Medication: INTRATHECAL | Qty: 1 | Status: AC

## 2015-12-02 NOTE — Progress Notes (Signed)
-   Normal Vitamin B-12 levels are between 211 and 946 pg/mL, for our Lab. Medical conditions that can increase levels of vitamin B12 include liver disease, kidney failure and myeloproliferative disorders, which includes myelocytic leukemia and polycythemia vera.

## 2015-12-16 NOTE — Progress Notes (Signed)
NOTE: This forensic urine drug screen (UDS) test was conducted using a state-of-the-art ultra high performance liquid chromatography and mass spectrometry system (UPLC/MS-MS), the most sophisticated and accurate method available. UPLC/MS-MS is 1,000 times more precise and accurate than standard gas chromatography and mass spectrometry (GC/MS). This system can analyze 26 drug categories and 180 drug compounds.  Unreported substance: Hydromorphone  The findings of this UDT were reported as abnormal due to inconsistencies with expected results. An unreported substance was identified in the sample. Expectations were based on the medication history provided by the patient at the time of sample collection.  These results may suggest one of the following possibilities:  1). The use of multiple providers, suggesting the illegal practice of "Doctor Shopping", in violation of [place] Statutes, as well as our medication agreement.  2). The use of unsanctioned and possibly illegal substances, in violation of [place] Statutes, as well as our medication agreement. 3). Inaccurate list of reported substances.

## 2016-01-22 DIAGNOSIS — M2021 Hallux rigidus, right foot: Secondary | ICD-10-CM | POA: Insufficient documentation

## 2016-02-18 HISTORY — PX: JOINT REPLACEMENT: SHX530

## 2016-02-21 ENCOUNTER — Other Ambulatory Visit: Payer: Self-pay

## 2016-02-21 DIAGNOSIS — G894 Chronic pain syndrome: Secondary | ICD-10-CM

## 2016-02-21 DIAGNOSIS — Z978 Presence of other specified devices: Secondary | ICD-10-CM

## 2016-02-21 MED ORDER — PAIN MANAGEMENT IT PUMP REFILL: 1.0000 | Freq: Once | INTRATHECAL | 0 refills | Status: DC

## 2016-03-04 ENCOUNTER — Ambulatory Visit: Payer: Worker's Compensation | Attending: Pain Medicine | Admitting: Pain Medicine

## 2016-03-04 ENCOUNTER — Encounter: Payer: Self-pay | Admitting: Pain Medicine

## 2016-03-04 ENCOUNTER — Telehealth: Payer: Self-pay

## 2016-03-04 VITALS — BP 129/70 | HR 59 | Temp 98.1°F | Resp 5 | Ht 60.0 in | Wt 220.0 lb

## 2016-03-04 DIAGNOSIS — Z79891 Long term (current) use of opiate analgesic: Secondary | ICD-10-CM | POA: Insufficient documentation

## 2016-03-04 DIAGNOSIS — Z9689 Presence of other specified functional implants: Secondary | ICD-10-CM | POA: Diagnosis not present

## 2016-03-04 DIAGNOSIS — M5441 Lumbago with sciatica, right side: Secondary | ICD-10-CM

## 2016-03-04 DIAGNOSIS — M961 Postlaminectomy syndrome, not elsewhere classified: Secondary | ICD-10-CM | POA: Diagnosis not present

## 2016-03-04 DIAGNOSIS — M79604 Pain in right leg: Secondary | ICD-10-CM | POA: Insufficient documentation

## 2016-03-04 DIAGNOSIS — Z978 Presence of other specified devices: Secondary | ICD-10-CM

## 2016-03-04 DIAGNOSIS — G894 Chronic pain syndrome: Secondary | ICD-10-CM | POA: Insufficient documentation

## 2016-03-04 DIAGNOSIS — F1190 Opioid use, unspecified, uncomplicated: Secondary | ICD-10-CM

## 2016-03-04 DIAGNOSIS — M5416 Radiculopathy, lumbar region: Secondary | ICD-10-CM | POA: Diagnosis not present

## 2016-03-04 DIAGNOSIS — G8929 Other chronic pain: Secondary | ICD-10-CM

## 2016-03-04 NOTE — Progress Notes (Signed)
Safety precautions to be maintained throughout the outpatient stay will include: orient to surroundings, keep bed in low position, maintain call bell within reach at all times, provide assistance with transfer out of bed and ambulation.  

## 2016-03-04 NOTE — Progress Notes (Signed)
Patient's Name: Brian Spencer  MRN: [id number]  Referring Provider: Milinda Pointer, MD  DOB: 1950/07/07  PCP: No primary care provider on file.  DOS: 03/04/2016  Note by: Brian Spencer. Brian Arbour, MD  Service setting: Ambulatory outpatient  Location: [facility] (AMB) Pain Management Facility  Visit type: Procedure  Specialty: Interventional Pain Management  Patient type: Established   Primary Reason for Visit: Interventional Pain Management Treatment. CC: Back Pain (lower)  Procedure:  Intrathecal Drug Delivery System (IDDS):  Type: Reservoir Refill [phone]) No rate change Region: Abdominal Laterality: Right  Type of Pump: Medtronic Synchromed II (MRI-compatible) Delivery Route: Intrathecal Type of Pain Treated: Neuropathic/Nociceptive Primary Medication Class: Opioid/opiate  Medication, Concentration, Infusion Program, & Delivery Rate: Please see scanned programming printout.  Indications: 1. Chronic pain syndrome   2. Failed back surgical syndrome   3. Chronic low back pain (Location of Primary Source of Pain) (Bilateral) (R>L)   4. Chronic lower extremity pain (Location of Secondary source of pain) (Right)   5. Chronic lumbar radicular pain (L5 dermatome) (Location of Secondary source of pain) (Right)   6. Presence of intrathecal pump   7. Long term current use of opiate analgesic   8. Opiate use (801 MME/Day)    Pain Assessment: Self-Reported Pain Score: 5 /10 Clinically the patient looks like a 2/10 Reported level is inconsistent with clinical observations. Information on the proper use of the pain score provided to the patient today.  Intrathecal Pump Therapy Assessment  Manufacturer: Medtronic Synchromed II Type: Programmable Volume: 40 mL reservoir MRI compatibility: Yes   Drug content:  Primary Medication Class: Opioid Primary Medication: PF-Hydromorphone (Dilaudid) Secondary Medication: Preservative-free clonidine Other Medication: see pump readout   Programming:  Type:  Simple continuous. See pump readout for details.   Changes:  Medication Change: None at this point Rate Change: No change in rate  Reported side-effects or adverse reactions: None reported  Effectiveness: Described as relatively effective, allowing for increase in activities of daily living (ADL) Clinically meaningful improvement in function (CMIF): Sustained CMIF goals met  Plan: Pump refill today  Pre-op Assessment:  Previous date of service:   Service provided:   Mr. Brian Spencer is a 66 y.o. (year old), male patient, seen today for interventional treatment. 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 His primarily concern today is the Back Pain (lower)  Initial Vital Signs: Blood pressure 129/70, pulse (!) 59, temperature 98.1 F (36.7 C), temperature source Oral, resp. rate (!) 5, height 5' (1.524 m), weight 220 lb (99.8 kg), SpO2 96 %. Calculated BMI: Body mass index is 42.97 kg/m. Risk Assessment: Allergies: Reviewed. He is allergic to baclofen. Allergy Precautions: None required Coagulopathies: "Reviewed. None identified. Blood-thinner therapy: None at this time Active Infection(s): Reviewed. None identified. Mr. Brian Spencer is afebrile Site Confirmation: Mr. Brian Spencer was  asked to confirm the procedure and laterality before marking the site Procedure checklist: Completed Consent: Before the procedure and under the influence of no sedative(s), amnesic(s), or anxiolytics, the  patient was informed of the treatment options, risks and possible complications. To fulfill our ethical and legal obligations, as recommended by the American Medical Association's Code of Ethics, I have informed the patient of my clinical impression; the nature and purpose of the treatment or procedure; the risks, benefits, and possible complications of the intervention; the alternatives, including doing nothing; the risk(s) and benefit(s) of the alternative treatment(s) or procedure(s); and the risk(s) and benefit(s) of doing nothing.  Mr. Brian Spencer was provided with information about the general risks and possible complications associated with most interventional procedures. These include, but are not limited to: failure to achieve desired goals, infection, bleeding, organ or nerve damage, allergic reactions, paralysis, and/or death.  In addition, he was informed of those risks and possible complications associated to this particular procedure, which include, but are not limited to: damage to the implant; failure to decrease pain; local, systemic, or serious CNS infections, intraspinal abscess with possible cord compression and paralysis, or life-threatening such as meningitis; bleeding; organ damage; nerve injury or damage with subsequent sensory, motor, and/or autonomic system dysfunction, resulting in transient or permanent pain, numbness, and/or weakness of one or several areas of the body; allergic reactions, either minor or major life-threatening, such as anaphylactic or anaphylactoid reactions.  Furthermore, Brian Spencer was informed of those risks and complications associated with the medications. These include, but are not limited to: allergic reactions (i.e.: anaphylactic or anaphylactoid reactions);  endorphine suppression; bradycardia and/or hypotension; water retention and/or peripheral vascular relaxation leading to lower extremity edema and possible stasis ulcers; respiratory depression and/or shortness of breath; decreased metabolic rate leading to weight gain; swelling or edema; medication-induced neural toxicity; particulate matter embolism and blood vessel occlusion with resultant organ, and/or nervous system infarction; and/or intrathecal granuloma formation with possible spinal cord compression and permanent paralysis.  Before refilling the pump Mr. Zerbe was informed that some of the medications used in the devise may not be FDA approved for such use and therefore it constitutes an off-label use of the medications.  Finally, he was informed that Medicine is not an exact science; therefore, there is also the possibility of unforeseen or unpredictable risks and/or possible complications that may result in a catastrophic outcome. The patient indicated having understood very clearly. We have given the patient no guarantees and we have made no promises. Enough time was given to the patient to ask questions, all of which were answered to the patient's satisfaction. Mr. Carmickle has indicated that he wanted to continue with the procedure. Attestation: I, the ordering provider, attest that I have discussed with the patient the benefits, risks, side-effects, alternatives, likelihood of achieving goals, and potential problems during recovery for the procedure that I have provided informed consent. Date: 03/04/2016; Time: 3:06 PM  Pre-Procedure Preparation:  Monitoring: As per clinic protocol. Respiration, ETCO2, SpO2, BP, heart rate and rhythm monitor placed and checked for adequate function Safety Precautions: Patient was assessed for positional comfort and pressure points before starting the procedure. Time-out: I initiated and conducted the "Time-out" before starting the procedure, as per protocol.  The patient was asked to participate by confirming the accuracy of the "Time Out" information. Verification of the correct person, site, and procedure were performed and confirmed by me, the nursing staff, and the patient. "Time-out" conducted as per Joint Commission's Universal Protocol (UP.01.01.01).  Description of Procedure Process:   Position: Supine Target Area: Central-port of intrathecal pump. Approach: Anterior, 90 degree angle approach. Area Prepped: Entire Area around the pump implant. Prepping  solution: ChloraPrep (2% chlorhexidine gluconate and 70% isopropyl alcohol) Safety Precautions: Aspiration looking for blood return was conducted prior to all injections. At no point did we inject any substances, as a needle was being advanced. No attempts were made at seeking any paresthesias. Safe injection practices and needle disposal techniques used. Medications properly checked for expiration dates. SDV (single dose vial) medications used. Description of the Procedure: Protocol guidelines were followed. Two nurses trained to do implant refills were present during the entire procedure. The refill medication was checked by both healthcare providers as well as the patient. The patient was included in the "Time-out" to verify the medication. The patient was placed in position. The pump was identified. The area was prepped in the usual manner. The sterile template was positioned over the pump, making sure the side-port location matched that of the pump. Both, the pump and the template were held for stability. The needle provided in the Medtronic Kit was then introduced thru the center of the template and into the central port. The pump content was aspirated and discarded volume documented. The new medication was slowly infused into the pump, thru the filter, making sure to avoid overpressure of the device. The needle was then removed and the area cleansed, making sure to leave some of the prepping  solution back to take advantage of its long term bactericidal properties. The pump was interrogated and programmed to reflect the correct medication, volume, and dosage. The program was printed and taken to the physician for approval. Once checked and signed by the physician, a copy was provided to the patient and another scanned into the EMR. Materials & Medications: Medtronic Refill Kit Medication(s): Please see chart orders for details.  Imaging Guidance:  Type of Imaging Technique: None used Indication(s): N/A Exposure Time: No patient exposure Contrast: None used. Fluoroscopic Guidance: N/A Ultrasound Guidance: N/A Interpretation: N/A  Antibiotic Prophylaxis:  Indication(s): None identified Antibiotic given: None  Post-operative Assessment:  EBL: None Complications: No immediate post-treatment complications observed by team, or reported by patient. Note: The patient tolerated the entire procedure well. A repeat set of vitals were taken after the procedure and the patient was kept under observation following institutional policy, for this type of procedure. Post-procedural neurological assessment was performed, showing return to baseline, prior to discharge. The patient was provided with post-procedure discharge instructions, including a section on how to identify potential problems. Should any problems arise concerning this procedure, the patient was given instructions to immediately contact us, at any time, without hesitation. In any case, we plan to contact the patient by telephone for a follow-up status report regarding this interventional procedure. Comments:  No additional relevant information.  Plan of Care  Disposition: Discharge home  Discharge Date & Time: 03/04/2016; 1515 hrs.  Physician-requested Follow-up:  Return for Pump Refill (as per pump program).  Future Appointments Date Time Provider [place]  06/17/2016 11:00 AM Brian Pointer, MD [facility] None    Medications ordered for procedure: No orders of the defined types were placed in this encounter.  Medications administered: Mr. Mccambridge had no medications administered during this visit.  See the medical record for exact dosing, route, and time of administration.  Lab-work, Procedure(s), & Referral(s) Ordered: Orders Placed This Encounter  Procedures  . PUMP REFILL  . PUMP REFILL   Imaging Ordered: No results found for this or any previous visit. New Prescriptions   No medications on file   Primary Care Physician: No primary care provider on file. Location: [facility] Note by: Brian Spencer. Brian Spencer, M.D, DABA, DABAPM, DABPM, DABIPP, FIPP Date: 03/04/2016; Time: 4:44 PM  Disclaimer:  Medicine is not an Chief Strategy Officer. The only guarantee in medicine is that nothing is guaranteed. It is important to note that the decision to proceed with this intervention was based on the information collected from the patient. The Data and conclusions were drawn from the patient's questionnaire, the interview, and the physical examination. Because the information was provided in large part by the patient, it cannot be guaranteed that it has not been purposely or unconsciously manipulated. Every effort has been made to obtain as much relevant data as possible for this evaluation. It is important to note that the conclusions that lead to this procedure are derived in large part from the available data. Always take into account that the treatment will also be dependent on availability of resources and existing treatment guidelines, considered by other Pain Management Practitioners as being common knowledge and practice, at the time of the intervention. For Medico-Legal purposes, it is also important to point out that variation in procedural techniques and pharmacological choices are the acceptable norm. The indications, contraindications, technique, and results of the above procedure  should only be interpreted and judged by a Board-Certified Interventional Pain Specialist with extensive familiarity and expertise in the same exact procedure and technique. Attempts at providing opinions without similar or greater experience and expertise than that of the treating physician will be considered as inappropriate and unethical, and shall result in a formal complaint to the state medical board and applicable specialty societies.  Instructions provided at this appointment: Patient Instructions  Keep your printout with you at all times.  Post-op Pain Management on a Chronic Pain Patient  Why should the surgeon manage his patient's post-op pain? The Surgeon is uniquely qualified to determine the amount of post-operative pain to be expected on a procedure or surgery that he/she has performed. Even with similar surgeries, the surgeon's perspective on expected pain is unique, since he/she performed the procedure and knows the degree of difficulty and/or tissue damage involved in each particular case. The surgeon is also up to date on events such as blood loss, intraoperative complications, and PO (per orum) status that may influence not only the patient's dose and schedule, but route of administration as well.  How about telling chronic pain patients to just double up or increase their usual pain medication intake to compensate for the increased pain? This is a bad idea since it will lead to the patient running out of his/her usual medications early and this may create a problem at the level of the insurance, which supplies medications based on the amount and schedule stated on the prescription. Running out early may trigger an event where the refill is denied by the insurance company and/or pharmacy. In addition, this practice provides a very poor paper trail as to why this patient ran out of medication early. In addition, from the perspective of the pain physician, it creates a nightmare in the  accounting of the patient's medication.  So, what should I do as a Psychologist, sport and exercise when confronted with a patient that needs surgery and already takes a significant amount of pain medicine for their chronic pain, which may or may not be related to the surgery I have to perform?  This is what the surgeon should do: 1. Do not change the dose or schedule of the pain medications prescribed by the pain specialist. This medication regimen allows  for the patient's chronic pain to be under control, so as to bring that patient down to the level of an average individual. 2. Have the patient continue their usual pain management regimen, without any alterations. In addition, manage the post-operative pain as you would on any other "narcotic naive" patient. Do not attempt to compensate for tolerance. This is what the patient's usual regimen will do for you. Simply treat the patient as if they had no chronic pain and as if they were taking no other pain medications. 3. Talk to the patient about the medication, just like you would for anyone else. Do not assume that they are experts in opioids. Make sure you let the patient know that the medication is to be used only if absolutely necessary. (PRN) 4. Prescribe the medication for as long as you would on any other patient undergoing the same type of surgery. Prescribe for the same average amount of time that you would on any other patient. Avoid prescribing for longer periods. 5. Send [place] a copy of the operative report with information about your choice of the post-op pain medication provided. 6. Keep [place] informed of any complications that may prolong the average duration patient's post-op pain.  If you have any questions, please feel to contact us at [phone].

## 2016-03-04 NOTE — Telephone Encounter (Signed)
Pt is due to have his pump refilled tomorrow he says if it snows what will happen? Please call pt and inform him what he needs to do

## 2016-03-04 NOTE — Patient Instructions (Addendum)
Keep your printout with you at all times.  Post-op Pain Management on a Chronic Pain Patient  Why should the surgeon manage his patient's post-op pain? The Surgeon is uniquely qualified to determine the amount of post-operative pain to be expected on a procedure or surgery that he/she has performed. Even with similar surgeries, the surgeon's perspective on expected pain is unique, since he/she performed the procedure and knows the degree of difficulty and/or tissue damage involved in each particular case. The surgeon is also up to date on events such as blood loss, intraoperative complications, and PO (per orum) status that may influence not only the patient's dose and schedule, but route of administration as well.  How about telling chronic pain patients to just double up or increase their usual pain medication intake to compensate for the increased pain? This is a bad idea since it will lead to the patient running out of his/her usual medications early and this may create a problem at the level of the insurance, which supplies medications based on the amount and schedule stated on the prescription. Running out early may trigger an event where the refill is denied by the insurance company and/or pharmacy. In addition, this practice provides a very poor paper trail as to why this patient ran out of medication early. In addition, from the perspective of the pain physician, it creates a nightmare in the accounting of the patient's medication.  So, what should I do as a Psychologist, sport and exercise when confronted with a patient that needs surgery and already takes a significant amount of pain medicine for their chronic pain, which may or may not be related to the surgery I have to perform?  This is what the surgeon should do: 1. Do not change the dose or schedule of the pain medications prescribed by the pain specialist. This medication regimen allows for the patient's chronic pain to be under control, so as to bring that  patient down to the level of an average individual. 2. Have the patient continue their usual pain management regimen, without any alterations. In addition, manage the post-operative pain as you would on any other "narcotic naive" patient. Do not attempt to compensate for tolerance. This is what the patient's usual regimen will do for you. Simply treat the patient as if they had no chronic pain and as if they were taking no other pain medications. 3. Talk to the patient about the medication, just like you would for anyone else. Do not assume that they are experts in opioids. Make sure you let the patient know that the medication is to be used only if absolutely necessary. (PRN) 4. Prescribe the medication for as long as you would on any other patient undergoing the same type of surgery. Prescribe for the same average amount of time that you would on any other patient. Avoid prescribing for longer periods. 5. Send [place] a copy of the operative report with information about your choice of the post-op pain medication provided. 6. Keep [place] informed of any complications that may prolong the average duration patient's post-op pain.  If you have any questions, please feel to contact us at [phone]. Post- Procedure Evaluation Introduction Every nerve block has two possibly beneficial components: a diagnostic and a treatment component. The diagnostic component is short lived, while the treatment (also known as "therapeutic") component is longer lasting. With very few exceptions, local anesthetics are responsible for the diagnostic component, while steroids are responsible for the therapeutic component.  Importance New trends  in healthcare regulations demand that a plan of care be formulated and proof of benefit documented. Performing a procedure without proper follow-up is not good medical practice. Studies would suggest that accurate memory recollection diminishes with time. Therefore, it is important that all  patients keep their post-procedure follow-up appointments and their "Post-Procedure Pain Diary". Both are essential to the proper evaluation of the treatment and possible adjustments in therapy.  Diagnostic Component This is the most important component of any nerve block. The success of your therapy depends on the proper interpretation of each treatment. In turn, this interpretation is completely dependent on the patient's accurate account of the results experienced during the four key periods after the block. These four key periods include: The "ultra-short term relief of the pain"; the "short term relief"; the "long term relief"; and the "ongoing relief" periods.  These periods are defined as follows: 1. "Ultra-short term" period refers to the period of time starting 10 to 15 minutes after the completion of the procedure, and ending 1 hour after the procedure.  2. "Short term" refers to the period of time starting 10 to 15 minutes after the procedure and ending 4 to 6 hours after the completion of the procedure. This period of time does overlap "Ultra-short term" period. 3. "Long term" refers to a period of time which may start with the procedure, or may start 4 to 10 days after the procedure, and may last past 14 days after the treatment.  4. "Ongoing relief" refers to that relief that continues to be present at the time of the post-procedure evaluation. In the past, this was the only type of relief reported by patients at the time of their post-procedure evaluation.  Therapeutic (treatment) Component This is that portion of the nerve block which has the potential to provide long lasting benefit. With very few exceptions, steroids are responsible for this part of the benefit.  Nerve Block Medication(s) The two main medications found in most nerve blocks include: a local anesthetic and a steroid.  The local anesthetic (or numbing medicine) is the component that renders the injected area numb for a  period of time. Local anesthetics begin to work (numb) within 15 minutes of being injected. The duration of this numbness is quite predictable and dependent on the type of local anesthetic used. The expected result are so predictable and accurate that lack of pain relief at the injected site can be taken as proof that the pain is not coming from it. A nerve block without local anesthetics is not considered to be a "nerve block" since the local anesthetics are the only component in the mixture that can "block" nerve conduction. A nerve block without local anesthetics is just an injection, not a nerve block. Local anesthetics carry the bulk of the diagnostic portion of the procedure. Local anesthetics are responsible for the "ultra-short term relief", as well as the "short term relief" of the nerve block. The second medication found in a typical nerve block are the steroids. Contrary to local anesthetics, the steroids have no numbing effect. The way steroids provide relief of the pain is by decreasing the swelling associated with the pain. On the average, this process takes 4 to 10 days. The addition of steroids is not necessary for a nerve block, however, not adding the steroids would limit the nerve block to simply being diagnostic, but not therapeutic. Steroids should be avoided in patients with known contraindications to their use.  Failure of Therapy Unrealistic expectations are  the most common causes of "perceived failure".  In a perfect world, a single nerve block should be able to completely and permanently eliminate the pain. Sadly, the world is not perfect. All nerve blocks, including those that do not provide relief of the pain, provide [place] with information and therefore benefit. The only nerve blocks that are considered to be a waste of time are those where the patient does not return for an evaluation. These includes those where the patient attains 100% relief of the pain for a long time. In these  case, the information from this successful treatment is lost. If the pain returns several years later, there will be no documentation that the therapy helped.   Interpretation of Results 1. Ultra-short term relief - (initial 1 hour after procedure) In those cases where no sedation is used, the results are interpreted based on the effects of the local anesthetics. In those cases where sedation is requested, consideration must be given to the sedation, as well as the local anesthetic effects. There are three (3) possible outcomes: a. 100% relief - Two (2) possible reasons i. If no sedation was given: Then the benefit can only come from the local anesthetics. This would indicate that 100% of the pain comes from the injected area. ii. If intravenous (IV) sedation was given: Then the relief may be due to the sedation or to a combination of the sedation and the injected local anesthetics. Further evaluation of the "Short term relief period" would be needed to narrow down which one is responsible. b. 0% relief - Two (2) possible reasons i. If no sedation was given: Then this result would indicate that the reported pain is not coming from injected area. ii. If IV sedation was given: This result would suggest that not only is the pain not coming from the injected area, but in addition the class of sedatives administered would not be beneficial for the type of pain being treated. c. Some relief, but not complete (anything between 0 and 100% relief) - Two (2) possible reasons i. If no sedation was given: This would suggest that whatever percentage of the pain improved, then that portion comes from the injected area, while any pain that did not improve is possibly coming from somewhere else. ii. If IV sedation was provided: Here, the partial improvement of the pain would suggest poor effectiveness of the sedation in addition to either no relief from the local anesthetics, or partial relief, once more suggesting that  the injected site is not responsible for 100% of the reported pain. 2. Short Term Relief - (initial 4 to 6 hours after procedure) Because by now the effects of the IV sedation should be gone, the results are completely and solely dependent on the effects of the local anesthetic over the injected site. Again, here there are three (3) possible outcomes: a. 100% relief -.All of the pain is coming from the injected site. b. 0% relief - None of the pain is coming from the injected site. c. Some relief, but not complete (anything between 0 and 100% relief) - This would suggest that the injected site is partially responsible for the pain. It would also suggest that there is some pain that remains unaccounted for. 3. Long Term Relief - (starting 4 to 10 days after procedure) Because the local anesthetic effect is long gone by this time, any benefit experienced during this period is due to the other medications present in the nerve block injection. Most of the time  this is due to the steroids. Again, here there are three (3) possible outcomes: a. 100% relief -.All of the pain is coming from the injected site. b. 0% relief - None of the pain is coming from the injected site. c. Some relief, but not complete (anything between 0 and 100% relief) - This would suggest that the injected site is partially responsible for the pain. It would also suggest that there is some pain that remains unaccounted for.  Most pain management nerve blocks are performed using local anesthetics and steroids. Steroids are responsible for any long-term benefit that you may experience. Their purpose is to decrease any chronic swelling that may exist in the area. Steroids begin to work immediately after being injected. However, most patients will not experience any benefits until 5 to 10 days after the injection, when the swelling has come down to the point where they can tell a difference. Steroids will only help if there is swelling to be  treated. As such, they can assist with the diagnosis. If effective, they suggest an inflammatory component to the pain, and if ineffective, they rule out inflammation as the main cause or component of the problem. If the problem is one of mechanical compression, you will get no benefit from those steroids.   In the case of local anesthetics, they have a crucial role in the diagnosis of your condition. Most will begin to work within15 to 20 minutes after injection. The duration will depend on the type used (short- vs. Long-acting). It is of outmost importance that patients keep tract of their pain, after the procedure. To assist with this matter, a "Post-procedure Pain Diary" is provided. Make sure to complete it and to bring it back to your follow-up appointment.  As long as the patient keeps accurate, detailed records of their symptoms after every procedure, and returns to have those interpreted, every procedure will provide [place] with invaluable information. Even a block that does not provide the patient with any relief, will always provide [place] with information about the mechanism and the origin of the pain. The only time a nerve block can be considered a waste of time is when patients do not keep track of the results, or do not keep their post-procedure appointment.  Reporting the results back to your physician The Pain Score Pain is a subjective complaint. It cannot be seen, touched, or measured. We depend entirely on the patient's report of the pain in order to assess your condition and treatment. To evaluate the pain, we use a pain scale, where "0" means "No Pain", and a "10" is "the worst possible pain that you can even imagine" (i.e. something like been eaten alive by a shark or being torn apart by a lion).  You will frequently be asked to rate your pain. Please be as accurate, remember that medical decisions will be based on your responses. Please do not rate your pain above a 10. Doing so is actually  interpreted as "symptom magnification" (exaggeration), as well as lack of understanding with regards to the scale. To put this into perspective, when you tell us that your pain is at a 10 (ten), what you are saying is that there is nothing we can do to make this pain any worse. (Carefully think about that.)

## 2016-03-04 NOTE — Telephone Encounter (Signed)
Informed patient that if the appointment must be cancelled because of the snow, it will be rescheduled as soon as possible.

## 2016-03-05 ENCOUNTER — Ambulatory Visit: Payer: Worker's Compensation | Admitting: Pain Medicine

## 2016-03-05 MED FILL — Medication: INTRATHECAL | Qty: 1 | Status: AC

## 2016-03-11 ENCOUNTER — Encounter: Payer: Self-pay | Admitting: Pain Medicine

## 2016-06-16 ENCOUNTER — Other Ambulatory Visit: Payer: Self-pay

## 2016-06-16 DIAGNOSIS — Z978 Presence of other specified devices: Secondary | ICD-10-CM

## 2016-06-16 DIAGNOSIS — G894 Chronic pain syndrome: Secondary | ICD-10-CM

## 2016-06-16 MED ORDER — PAIN MANAGEMENT IT PUMP REFILL: 1.0000 | Freq: Once | INTRATHECAL | 0 refills | Status: DC

## 2016-06-17 ENCOUNTER — Ambulatory Visit: Payer: Worker's Compensation | Admitting: Pain Medicine

## 2016-06-23 NOTE — Progress Notes (Signed)
Patient's Name: Brian Spencer  MRN: [id number]  Referring Provider: No ref. provider found  DOB: 01/20/1951  PCP: System, Pcp Not In  DOS: 06/24/2016  Note by: Linzey Ramser A. Dossie Arbour, MD  Service setting: Ambulatory outpatient  Location: [facility] (AMB) Pain Management Facility  Visit type: Procedure  Specialty: Interventional Pain Management  Patient type: Established   Primary Reason for Visit: Interventional Pain Management Treatment. CC: Back Pain (low)  Procedure:  Intrathecal Drug Delivery System (IDDS):  Type: Reservoir Refill [phone]) 10% rate decrease Region: Abdominal Laterality: Right  Type of Pump: Medtronic Synchromed II (MRI-compatible) Delivery Route: Intrathecal Type of Pain Treated: Neuropathic/Nociceptive Primary Medication Class: Opioid/opiate  Medication, Concentration, Infusion Program, & Delivery Rate: Please see scanned programming printout.  Indications: 1. Chronic pain syndrome   2. Chronic lumbar radicular pain (L5 dermatome) (Location of Secondary source of pain) (Right)   3. History of lumbar laminectomy   4. Failed back surgical syndrome   5. Long term prescription opiate use   6. Opiate use (801 MME/Day)   7. Presence of intrathecal pump   8. Encounter for interrogation of infusion pump    Pain Assessment: Self-Reported Pain Score: 5 /10 Clinically the patient looks like a 2/10 Reported level is inconsistent with clinical observations. Information on the proper use of the pain score provided to the patient today.  Intrathecal Pump Therapy Assessment  Manufacturer: Medtronic Synchromed II Type: Programmable Volume: 40 mL reservoir MRI compatibility: Yes   Drug content:  Primary Medication Class: Opioid Primary Medication: PF-Hydromorphone (Dilaudid) Secondary Medication: PF-Clonidine Other Medication: None   Programming:  Type: Simple continuous. See pump readout for details.   Changes:  Medication Change: None at this point Rate Change: No change in  rate  Reported side-effects or adverse reactions: None reported  Effectiveness: Described as relatively effective, allowing for increase in activities of daily living (ADL) Clinically meaningful improvement in function (CMIF): Sustained CMIF goals met  Plan: Pump refill today  Pre-op Assessment:  Previous date of service: 03/04/16 Service provided: Procedure (pump refill) Brian Spencer is a 66 y.o. (year old), male patient, seen today for interventional treatment. 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 His primarily concern today is the Back Pain (low)  The patient indicates being interested in tapering his pump down until he can completely stop it. He refers that he is doing this out of fear since his pump, prior to him being a part of this practice, had failed twice and when it did, he experienced bad withdrawals including dry heaves the made it very difficult for him to breathe. He describes already having problems with COPD and in fact he comes in today with an upper respiratory infection that is currently being treated with antibiotics. Today he is febrile and therefore we have decided to proceed with the refill. Today we have also programmed the pump with a 10% decrease. We  will continue to slowly decrease over time until he is taking minimal doses.  If we can, will even try to switch him from opioids to local anesthetics only and see if that provides him with good relief without any of the respiratory problems. I shared this plan with him and he was very antispastic about it.  Initial Vital Signs: There were no vitals taken for this visit. BMI: 39.48 kg/m  Risk Assessment: Allergies: Reviewed. He is allergic to baclofen.  Allergy Precautions: None required Coagulopathies: Reviewed. None identified.  Blood-thinner therapy: None at this time Active Infection(s): Reviewed. None identified. Brian Spencer is afebrile  Site Confirmation: Brian Spencer was asked to confirm the procedure and laterality before marking the site Procedure checklist: Completed Consent: Before the procedure and under the influence of no sedative(s), amnesic(s), or anxiolytics, the patient was informed of the treatment options, risks and possible complications. To fulfill our ethical and legal obligations, as recommended by the American Medical Association's Code of Ethics, I have informed the patient of my clinical impression; the nature and purpose of the treatment or procedure; the risks, benefits, and possible complications of the intervention; the alternatives, including doing nothing; the risk(s) and benefit(s) of the alternative treatment(s) or procedure(s); and the risk(s) and benefit(s) of doing nothing.  Brian Spencer was provided with information about the general risks and possible complications associated with most interventional procedures. These include, but are not limited to: failure to achieve desired goals, infection, bleeding, organ or nerve damage, allergic reactions, paralysis, and/or death.  In addition, he was informed of those risks and possible complications associated to this particular procedure, which include, but are not limited to: damage to the implant; failure to decrease  pain; local, systemic, or serious CNS infections, intraspinal abscess with possible cord compression and paralysis, or life-threatening such as meningitis; bleeding; organ damage; nerve injury or damage with subsequent sensory, motor, and/or autonomic system dysfunction, resulting in transient or permanent pain, numbness, and/or weakness of one or several areas of the body; allergic reactions, either minor or major life-threatening, such as anaphylactic or anaphylactoid reactions.  Furthermore, Mr. Kron was informed of those risks and complications associated with the medications. These include, but are not limited to: allergic reactions (i.e.: anaphylactic or anaphylactoid reactions); endorphine suppression; bradycardia and/or hypotension; water retention and/or peripheral vascular relaxation leading to lower extremity edema and possible stasis ulcers; respiratory depression and/or shortness of breath; decreased metabolic rate leading to weight gain; swelling or edema; medication-induced neural toxicity; particulate matter embolism and blood vessel occlusion with resultant organ, and/or nervous system infarction; and/or intrathecal granuloma formation with possible spinal cord compression and permanent paralysis.  Before refilling the pump Mr. Beddow was informed that some of the medications used in the devise may not be FDA approved for such use and therefore it constitutes an off-label use of the medications.  Finally, he was informed that Medicine is not an exact science; therefore, there is also the possibility of unforeseen or unpredictable risks and/or possible complications that may result in a catastrophic outcome. The patient indicated having understood very clearly. We have given the patient no guarantees and we have made no promises. Enough time was given to the patient to ask questions, all of which were answered to the patient's satisfaction. Mr. Suit has indicated that he wanted to continue  with the procedure. Attestation: I, the ordering provider, attest that I have discussed with the patient the benefits, risks, side-effects, alternatives, likelihood of achieving goals, and potential problems during recovery for the procedure that I have provided informed consent. Date: 06/24/2016; Time: 5:03  PM  Pre-Procedure Preparation:  Monitoring: As per clinic protocol. Respiration, ETCO2, SpO2, BP, heart rate and rhythm monitor placed and checked for adequate function Safety Precautions: Patient was assessed for positional comfort and pressure points before starting the procedure. Time-out: I initiated and conducted the "Time-out" before starting the procedure, as per protocol. The patient was asked to participate by confirming the accuracy of the "Time Out" information. Verification of the correct person, site, and procedure were performed and confirmed by me, the nursing staff, and the patient. "Time-out" conducted as per Joint Commission's Universal Protocol (UP.01.01.01). "Time-out" Date & Time: 06/24/2016; 0930 hrs.  Description of Procedure Process:   Position: Supine Target Area: Central-port of intrathecal pump. Approach: Anterior, 90 degree angle approach. Area Prepped: Entire Area around the pump implant. Prepping solution: ChloraPrep (2% chlorhexidine gluconate and 70% isopropyl alcohol) Safety Precautions: Aspiration looking for blood return was conducted prior to all injections. At no point did we inject any substances, as a needle was being advanced. No attempts were made at seeking any paresthesias. Safe injection practices and needle disposal techniques used. Medications properly checked for expiration dates. SDV (single dose vial) medications used. Description of the Procedure: Protocol guidelines were followed. Two nurses trained to do implant refills were present during the entire procedure. The refill medication was checked by both healthcare providers as well as the patient.  The patient was included in the "Time-out" to verify the medication. The patient was placed in position. The pump was identified. The area was prepped in the usual manner. The sterile template was positioned over the pump, making sure the side-port location matched that of the pump. Both, the pump and the template were held for stability. The needle provided in the Medtronic Kit was then introduced thru the center of the template and into the central port. The pump content was aspirated and discarded volume documented. The new medication was slowly infused into the pump, thru the filter, making sure to avoid overpressure of the device. The needle was then removed and the area cleansed, making sure to leave some of the prepping solution back to take advantage of its long term bactericidal properties. The pump was interrogated and programmed to reflect the correct medication, volume, and dosage. The program was printed and taken to the physician for approval. Once checked and signed by the physician, a copy was provided to the patient and another scanned into the EMR. Vitals:   06/24/16 0949  BP: (!) 126/59  Pulse: 73  Resp: 18  Temp: 98.5 F (36.9 C)  SpO2: 95%  Weight: 230 lb (104.3 kg)  Height: 5' 4"  (1.626 m)    Start Time: 0940 hrs. End Time: 0950 hrs. Materials & Medications: Medtronic Refill Kit Medication(s): Please see chart orders for details.  Imaging Guidance:  Type of Imaging Technique: None used Indication(s): N/A Exposure Time: No patient exposure Contrast: None used. Fluoroscopic Guidance: N/A Ultrasound Guidance: N/A Interpretation: N/A  Antibiotic Prophylaxis:  Indication(s): None identified Antibiotic given: None  Post-operative Assessment:  EBL: None Complications: No immediate post-treatment complications observed by team, or reported by patient. Note: The patient tolerated the entire procedure well. A repeat set of vitals were taken after the procedure and the  patient was kept under observation following institutional policy, for this type of procedure. Post-procedural neurological assessment was performed, showing return to baseline, prior to discharge. The patient was provided with post-procedure discharge instructions, including a section on how to identify potential problems. Should any problems arise concerning this  procedure, the patient was given instructions to immediately contact us, at any time, without hesitation. In any case, we plan to contact the patient by telephone for a follow-up status report regarding this interventional procedure. Comments:  No additional relevant information.  Plan of Care  Disposition: Discharge home  Discharge Date & Time: 06/24/2016; 1102 hrs.  Physician-requested Follow-up:  Return for Pump Refill (as per pump program).  Future Appointments Date Time Provider [place]  10/21/2016 10:00 AM Milinda Pointer, MD [facility] None   Medications ordered for procedure: No orders of the defined types were placed in this encounter.  Medications administered: Mr. Skirvin had no medications administered during this visit.  See the medical record for exact dosing, route, and time of administration.  Lab-work, Procedure(s), & Referral(s) Ordered: Orders Placed This Encounter  Procedures  . PUMP REPROGRAM  . PUMP REFILL  . PUMP REFILL   Imaging Ordered: No results found for this or any previous visit. New Prescriptions   No medications on file   Primary Care Physician: System, Pcp Not In Location: [facility] Note by: Kathlen Brunswick. Dossie Arbour, M.D, DABA, DABAPM, DABPM, DABIPP, FIPP Date: 06/24/2016; Time: 11:23 AM  Disclaimer:  Medicine is not an exact science. The only guarantee in medicine is that nothing is guaranteed. It is important to note that the decision to proceed with this intervention was based on the information collected from the patient. The Data and conclusions were  drawn from the patient's questionnaire, the interview, and the physical examination. Because the information was provided in large part by the patient, it cannot be guaranteed that it has not been purposely or unconsciously manipulated. Every effort has been made to obtain as much relevant data as possible for this evaluation. It is important to note that the conclusions that lead to this procedure are derived in large part from the available data. Always take into account that the treatment will also be dependent on availability of resources and existing treatment guidelines, considered by other Pain Management Practitioners as being common knowledge and practice, at the time of the intervention. For Medico-Legal purposes, it is also important to point out that variation in procedural techniques and pharmacological choices are the acceptable norm. The indications, contraindications, technique, and results of the above procedure should only be interpreted and judged by a Board-Certified Interventional Pain Specialist with extensive familiarity and expertise in the same exact procedure and technique.  Instructions provided at this appointment: Patient Instructions   Pain Score  Introduction: The pain score used by this practice is the Verbal Numerical Rating Scale (VNRS-11). This is an 11-point scale. It is for adults and children 10 years or older. There are significant differences in how the pain score is reported, used, and applied. Forget everything you learned in the past and learn this scoring system.  General Information: The scale should reflect your current level of pain. Unless you are specifically asked for the level of your worst pain, or your average pain. If you are asked for one of these two, then it should be understood that it is over the past 24 hours.  Basic Activities of Daily Living (ADL): Personal hygiene, dressing, eating, transferring, and using restroom.  Instructions: Most  patients tend to report their level of pain as a combination of two factors, their physical pain and their psychosocial pain. This last one is also known as "suffering" and it is reflection of how physical pain affects you socially and psychologically. From now on,  report them separately. From this point on, when asked to report your pain level, report only your physical pain. Use the following table for reference.  Pain Clinic Pain Levels (0-5/10)  Pain Level Score Description  No Pain 0   Mild pain 1 Nagging, annoying, but does not interfere with basic activities of daily living (ADL). Patients are able to eat, bathe, get dressed, toileting (being able to get on and off the toilet and perform personal hygiene functions), transfer (move in and out of bed or a chair without assistance), and maintain continence (able to control bladder and bowel functions). Blood pressure and heart rate are unaffected. A normal heart rate for a healthy adult ranges from 60 to 100 bpm (beats per minute).   Mild to moderate pain 2 Noticeable and distracting. Impossible to hide from other people. More frequent flare-ups. Still possible to adapt and function close to normal. It can be very annoying and may have occasional stronger flare-ups. With discipline, patients may get used to it and adapt.   Moderate pain 3 Interferes significantly with activities of daily living (ADL). It becomes difficult to feed, bathe, get dressed, get on and off the toilet or to perform personal hygiene functions. Difficult to get in and out of bed or a chair without assistance. Very distracting. With effort, it can be ignored when deeply involved in activities.   Moderately severe pain 4 Impossible to ignore for more than a few minutes. With effort, patients may still be able to manage work or participate in some social activities. Very difficult to concentrate. Signs of autonomic nervous system discharge are evident: dilated pupils (mydriasis);  mild sweating (diaphoresis); sleep interference. Heart rate becomes elevated (>115 bpm). Diastolic blood pressure (lower number) rises above 100 mmHg. Patients find relief in laying down and not moving.   Severe pain 5 Intense and extremely unpleasant. Associated with frowning face and frequent crying. Pain overwhelms the senses.  Ability to do any activity or maintain social relationships becomes significantly limited. Conversation becomes difficult. Pacing back and forth is common, as getting into a comfortable position is nearly impossible. Pain wakes you up from deep sleep. Physical signs will be obvious: pupillary dilation; increased sweating; goosebumps; brisk reflexes; cold, clammy hands and feet; nausea, vomiting or dry heaves; loss of appetite; significant sleep disturbance with inability to fall asleep or to remain asleep. When persistent, significant weight loss is observed due to the complete loss of appetite and sleep deprivation.  Blood pressure and heart rate becomes significantly elevated. Caution: If elevated blood pressure triggers a pounding headache associated with blurred vision, then the patient should immediately seek attention at an urgent or emergency care unit, as these may be signs of an impending stroke.    Emergency Department Pain Levels (6-10/10)  Emergency Room Pain 6 Severely limiting. Requires emergency care and should not be seen or managed at an outpatient pain management facility. Communication becomes difficult and requires great effort. Assistance to reach the emergency department may be required. Facial flushing and profuse sweating along with potentially dangerous increases in heart rate and blood pressure will be evident.   Distressing pain 7 Self-care is very difficult. Assistance is required to transport, or use restroom. Assistance to reach the emergency department will be required. Tasks requiring coordination, such as bathing and getting dressed become very  difficult.   Disabling pain 8 Self-care is no longer possible. At this level, pain is disabling. The individual is unable to do even the most "basic" activities  such as walking, eating, bathing, dressing, transferring to a bed, or toileting. Fine motor skills are lost. It is difficult to think clearly.   Incapacitating pain 9 Pain becomes incapacitating. Thought processing is no longer possible. Difficult to remember your own name. Control of movement and coordination are lost.   The worst pain imaginable 10 At this level, most patients pass out from pain. When this level is reached, collapse of the autonomic nervous system occurs, leading to a sudden drop in blood pressure and heart rate. This in turn results in a temporary and dramatic drop in blood flow to the brain, leading to a loss of consciousness. Fainting is one of the body's self defense mechanisms. Passing out puts the brain in a calmed state and causes it to shut down for a while, in order to begin the healing process.    Summary: 1. Refer to this scale when providing [place] with your pain level. 2. Be accurate and careful when reporting your pain level. This will help with your care. 3. Over-reporting your pain level will lead to loss of credibility. 4. Even a level of 1/10 means that there is pain and will be treated at our facility. 5. High, inaccurate reporting will be documented as "Symptom Exaggeration", leading to loss of credibility and suspicions of possible secondary gains such as obtaining more narcotics, or wanting to appear disabled, for fraudulent reasons. 6. Only pain levels of 5 or below will be seen at our facility. 7. Pain levels of 6 and above will be sent to the Emergency Department and the appointment cancelled. _____________________________________________________________________________________________  ____________________________________________________________________________________________  Medication  Rules  Applies to: All patients receiving prescriptions (written or electronic).  Pharmacy of record: Pharmacy where electronic prescriptions will be sent. If written prescriptions are taken to a different pharmacy, please inform the nursing staff. The pharmacy listed in the electronic medical record should be the one where you would like electronic prescriptions to be sent.  Prescription refills: Only during scheduled appointments. Applies to both, written and electronic prescriptions.  NOTE: The following applies primarily to controlled substances (Opioid Pain Medications)  Patient's responsibilities: 1. Pain Pills: Bring all pain pills to every appointment (except for procedure appointments). 2. Pill Bottles: Bring pills in original pharmacy bottle. Always bring newest bottle. Bring bottle, even if empty. 3. Medication refills: You are responsible for knowing and keeping track of what medications you need refilled. The day before your appointment, write a list of all prescriptions that need to be refilled. Bring that list to your appointment and give it to the admitting nurse. Prescriptions will be written only during appointments. If you forget a medication, it will not be "Called in", "Faxed", or "electronically sent". You will need to get another appointment to get these prescribed. 4. Prescription Accuracy: You are responsible for carefully inspecting your prescriptions before leaving our office. Have the discharge nurse carefully go over each prescription with you, before taking them home. Make sure that your name is accurately spelled, that your address is correct. Check the name and dose of your medication to make sure it is accurate. Check the number of pills, and the written instructions to make sure they are clear and accurate. Make sure that you are given enough medication to last until your next medication refill appointment. 5. Taking Medication: Take medication as prescribed. Never  take more pills than instructed. Never take medication more frequently than prescribed. Taking less pills or less frequently is permitted and encouraged, when it comes to  controlled substances (written prescriptions).  6. Inform other Doctors: Always inform, all of your healthcare providers, of all the medications you take. 7. Pain Medication from other Providers: You are not allowed to accept any additional pain medication from any other Doctor or Healthcare provider. There are two exceptions to this rule. (see below) In the event that you require additional pain medication, you are responsible for notifying us, as stated below. 8. Medication Agreement: You are responsible for carefully reading and following our Medication Agreement. This must be signed before receiving any prescriptions from our practice. Safely store a copy of your signed Agreement. Violations to the Agreement will result in no further prescriptions. (Additional copies of our Medication Agreement are available upon request.) 9. Laws, Rules, & Regulations: All patients are expected to follow all Federal and [employer], Rules, [employer]. Ignorance of the Laws does not constitute a valid excuse.  Exceptions: There are only two exceptions to the rule of not receiving pain medications from other Healthcare Providers. 1. Exception #1 (Emergencies): In the event of an emergency (i.e.: accident requiring emergency care), you are allowed to receive additional pain medication. However, you are responsible for: As soon as you are able, call our office [phone], at any time of the day or night, and leave a message stating your name, the date and nature of the emergency, and the name and dose of the medication prescribed. In the event that your call is answered by a member of our staff, make sure to document and save the date, time, and the name of the person that took your information.  2. Exception #2 (Planned Surgery): In the  event that you are scheduled by another doctor or dentist to have any type of surgery or procedure, you are allowed (for a period no longer than 30 days), to receive additional pain medication, for the acute post-op pain. However, in this case, you are responsible for picking up a copy of our "Post-op Pain Management for Surgeons" handout, and giving it to your surgeon or dentist. This document is available at our office, and does not require an appointment to obtain it. Simply go to our office during business hours (Monday-Thursday from 8:00 AM to 4:00 PM) (Friday 8:00 AM to 12:00 Noon) or if you have a scheduled appointment with [place], prior to your surgery, and ask for it by name. In addition, you will need to provide [place] with your name, name of your surgeon, type of surgery, and date of procedure or surgery.  _____________________________________________________________________________________________Post-op Pain Management on a Chronic Pain Patient  Why should the surgeon manage his patient's post-op pain? The Surgeon is uniquely qualified to determine the amount of post-operative pain to be expected on a procedure or surgery that he/she has performed. Even with similar surgeries, the surgeon's perspective on expected pain is unique, since he/she performed the procedure and knows the degree of difficulty and/or tissue damage involved in each particular case. The surgeon is also up to date on events such as blood loss, intraoperative complications, and PO (per orum) status that may influence not only the patient's dose and schedule, but route of administration as well.  How about telling chronic pain patients to just double up or increase their usual pain medication intake to compensate for the increased pain? This is a bad idea since it will lead to the patient running out of his/her usual medications early and this may create a problem at the level of the insurance, which supplies  medications based on the  amount and schedule stated on the prescription. Running out early may trigger an event where the refill is denied by the insurance company and/or pharmacy. In addition, this practice provides a very poor paper trail as to why this patient ran out of medication early. In addition, from the perspective of the pain physician, it creates a nightmare in the accounting of the patient's medication.  So, what should I do as a Psychologist, sport and exercise when confronted with a patient that needs surgery and already takes a significant amount of pain medicine for their chronic pain, which may or may not be related to the surgery I have to perform?  This is what the surgeon should do: 1. Do not change the dose or schedule of the pain medications prescribed by the pain specialist. This medication regimen allows for the patient's chronic pain to be under control, so as to bring that patient down to the level of an average individual. 2. Have the patient continue their usual pain management regimen, without any alterations. In addition, manage the post-operative pain as you would on any other "narcotic naive" patient. Do not attempt to compensate for tolerance. This is what the patient's usual regimen will do for you. Simply treat the patient as if they had no chronic pain and as if they were taking no other pain medications. 3. Talk to the patient about the medication, just like you would for anyone else. Do not assume that they are experts in opioids. Make sure you let the patient know that the medication is to be used only if absolutely necessary. (PRN) 4. Prescribe the medication for as long as you would on any other patient undergoing the same type of surgery. Prescribe for the same average amount of time that you would on any other patient. Avoid prescribing for longer periods. 5. Send [place] a copy of the operative report with information about your choice of the post-op pain medication provided. 6. Keep [place] informed of any complications  that may prolong the average duration patient's post-op pain.  If you have any questions, please feel to contact us at [phone]. _____________________________________________________________________________________________   Opioid Overdose Opioids are substances that relieve pain by binding to pain receptors in your brain and spinal cord. Opioids include illegal drugs, such as heroin, as well as prescription pain medicines.An opioid overdose happens when you take too much of an opioid substance. This can happen with any type of opioid, including:  Heroin.  Morphine.  Codeine.  Methadone.  Oxycodone.  Hydrocodone.  Fentanyl.  Hydromorphone.  Buprenorphine. The effects of an overdose can be mild, dangerous, or even deadly. Opioid overdose is a medical emergency. What are the causes? This condition may be caused by:  Taking too much of an opioid by accident.  Taking too much of an opioid on purpose.  An error made by a health care provider who prescribes a medicine.  An error made by the pharmacist who fills the prescription order.  Using more than one substance that contains opioids at the same time.  Mixing an opioid with a substance that affects your heart, breathing, or blood pressure. These include alcohol, tranquilizers, sleeping pills, illegal drugs, and some over-the-counter medicines. What increases the risk? This condition is more likely in:  Children. They may be attracted to colorful pills. Because of a child's small size, even a small amount of a drug can be dangerous.  Elderly people. They may be taking many different drugs. Elderly people may  have difficulty reading labels or remembering when they last took their medicine.  People who take an opioid on a long-term basis.  People who use:  Illegal drugs.  Other substances, including alcohol, while using an opioid.  People who have:  A history of drug or alcohol abuse.  Certain mental  health conditions.  People who take opioids that are not prescribed for them. What are the signs or symptoms? Symptoms of this condition depend on the type of opioid and the amount that was taken. Common symptoms include:  Sleepiness or difficulty waking from sleep.  Confusion.  Slurred speech.  Slowed breathing and a slow pulse.  Nausea and vomiting.  Abnormally small pupils. Signs and symptoms that require emergency treatment include:  Cold, clammy, and pale skin.  Blue lips and fingernails.  Vomiting.  Gurgling sounds in the throat.  A pulse that is very slow or difficult to detect.  Breathing that is very slow, noisy, or difficult to detect.  Limp body.  Inability to respond to speech or be awakened from sleep (stupor). How is this diagnosed? This condition is diagnosed based on your symptoms. It is important to tell your health care provider:  All of the opioidsthat you took.  When you took the opioids.  Whether you were drinking alcohol or using other substances. Your health care provider will do a physical exam. This exam may include:  Checking and monitoring your heart rate and rhythm, your breathing rate and depth, your temperature, and your blood pressure (vital signs).  Checking for abnormally small pupils.  Measuring oxygen levels in your blood. You may also have blood tests or urine tests. How is this treated? Supporting your vital signs and your breathing is the first step in treating an opioid overdose. Treatment may also include:  Giving fluids and minerals (electrolytes) through an IV tube.  Inserting a breathing tube (endotracheal tube) in your airway to help you breathe.  Giving oxygen.  Passing a tube through your nose and into your stomach (NG tube, or nasogastric tube) to wash out your stomach.  Giving medicines that:  Increase your blood pressure.  Absorb any opioid that is in your digestive system.  Reverse the effects of the  opioid (naloxone).  Ongoing counseling and mental health support if you intentionally overdosed or used an illegal drug. Follow these instructions at home:  Take over-the-counter and prescription medicines only as told by your health care provider. Always ask your health care provider about possible side effects and interactions of any new medicine that you start taking.  Keep a list of all of the medicines that you take, including over-the-counter medicines. Bring this list with you to all of your medical visits.  Drink enough fluid to keep your urine clear or pale yellow.  Keep all follow-up visits as told by your health care provider. This is important. How is this prevented?  Get help if you are struggling with:  Alcohol or drug use.  Depression or another mental health problem.  Keep the phone number of your local poison control center near your phone or on your cell phone.  Store all medicines in safety containers that are out of the reach of children.  Read the drug inserts that come with your medicines.  Do not drink alcohol when taking opioids.  Do not use illegal drugs.  Do not take opioid medicines that are not prescribed for you. Contact a health care provider if:  Your symptoms return.  You develop new  symptoms or side effects when you are taking medicines. Get help right away if:  You think that you or someone else may have taken too much of an opioid. The hotline of the [facility] is [phone].  You or someone else is having symptoms of an opioid overdose.  You have serious thoughts about hurting yourself or others.  You have:  Chest pain.  Difficulty breathing.  A loss of consciousness. Opioid overdose is an emergency. Do not wait to see if the symptoms will go away. Get medical help right away. Call your local emergency services (911 in the [place] Do not drive yourself to the hospital. This information is not intended to  replace advice given to you by your health care provider. Make sure you discuss any questions you have with your health care provider. Document Released: 03/13/2004 Document Revised: 07/12/2015 Document Reviewed: 07/20/2014 Elsevier Interactive Patient Education  2017 [employer].

## 2016-06-24 ENCOUNTER — Ambulatory Visit: Payer: Worker's Compensation | Attending: Pain Medicine | Admitting: Pain Medicine

## 2016-06-24 ENCOUNTER — Encounter: Payer: Self-pay | Admitting: Pain Medicine

## 2016-06-24 VITALS — BP 126/59 | HR 73 | Temp 98.5°F | Resp 18 | Ht 64.0 in | Wt 230.0 lb

## 2016-06-24 DIAGNOSIS — F1190 Opioid use, unspecified, uncomplicated: Secondary | ICD-10-CM

## 2016-06-24 DIAGNOSIS — G8929 Other chronic pain: Secondary | ICD-10-CM

## 2016-06-24 DIAGNOSIS — J449 Chronic obstructive pulmonary disease, unspecified: Secondary | ICD-10-CM | POA: Diagnosis not present

## 2016-06-24 DIAGNOSIS — Z978 Presence of other specified devices: Secondary | ICD-10-CM

## 2016-06-24 DIAGNOSIS — J069 Acute upper respiratory infection, unspecified: Secondary | ICD-10-CM | POA: Diagnosis not present

## 2016-06-24 DIAGNOSIS — Z981 Arthrodesis status: Secondary | ICD-10-CM | POA: Insufficient documentation

## 2016-06-24 DIAGNOSIS — G894 Chronic pain syndrome: Secondary | ICD-10-CM | POA: Diagnosis not present

## 2016-06-24 DIAGNOSIS — Z79891 Long term (current) use of opiate analgesic: Secondary | ICD-10-CM

## 2016-06-24 DIAGNOSIS — Z9889 Other specified postprocedural states: Secondary | ICD-10-CM

## 2016-06-24 DIAGNOSIS — M5416 Radiculopathy, lumbar region: Secondary | ICD-10-CM | POA: Insufficient documentation

## 2016-06-24 DIAGNOSIS — M961 Postlaminectomy syndrome, not elsewhere classified: Secondary | ICD-10-CM

## 2016-06-24 DIAGNOSIS — Z96652 Presence of left artificial knee joint: Secondary | ICD-10-CM | POA: Diagnosis not present

## 2016-06-24 DIAGNOSIS — Z9689 Presence of other specified functional implants: Secondary | ICD-10-CM

## 2016-06-24 DIAGNOSIS — Z462 Encounter for fitting and adjustment of other devices related to nervous system and special senses: Secondary | ICD-10-CM

## 2016-06-24 DIAGNOSIS — Z451 Encounter for adjustment and management of infusion pump: Secondary | ICD-10-CM

## 2016-06-24 NOTE — Progress Notes (Addendum)
Brian Spencer indicates having an upcomming surgery. Estimated date of surgery: unknown Surgeon:Dr [place]: [facility] orthopedic Type of surgery: Left finger surgery Brian Spencer has been provided today with a copy of the "Post-op Pain Management for Surgeons" handout with instructions to deliver it to the surgeon.  Intrathecal pump refilled using sterile technique with C. Tad Moore, D. Warden/ranger and K. Beuna Bolding RN.  Patient tolerated well.  Intrathecal pump rate decreased by 10 % per MD order.  Instuctions given on signs and symptoms of drug overdose.  Teach back 3 done.

## 2016-06-24 NOTE — Patient Instructions (Addendum)
Pain Score  Introduction: The pain score used by this practice is the Verbal Numerical Rating Scale (VNRS-11). This is an 11-point scale. It is for adults and children 10 years or older. There are significant differences in how the pain score is reported, used, and applied. Forget everything you learned in the past and learn this scoring system.  General Information: The scale should reflect your current level of pain. Unless you are specifically asked for the level of your worst pain, or your average pain. If you are asked for one of these two, then it should be understood that it is over the past 24 hours.  Basic Activities of Daily Living (ADL): Personal hygiene, dressing, eating, transferring, and using restroom.  Instructions: Most patients tend to report their level of pain as a combination of two factors, their physical pain and their psychosocial pain. This last one is also known as "suffering" and it is reflection of how physical pain affects you socially and psychologically. From now on, report them separately. From this point on, when asked to report your pain level, report only your physical pain. Use the following table for reference.  Pain Clinic Pain Levels (0-5/10)  Pain Level Score Description  No Pain 0   Mild pain 1 Nagging, annoying, but does not interfere with basic activities of daily living (ADL). Patients are able to eat, bathe, get dressed, toileting (being able to get on and off the toilet and perform personal hygiene functions), transfer (move in and out of bed or a chair without assistance), and maintain continence (able to control bladder and bowel functions). Blood pressure and heart rate are unaffected. A normal heart rate for a healthy adult ranges from 60 to 100 bpm (beats per minute).   Mild to moderate pain 2 Noticeable and distracting. Impossible to hide from other people. More frequent flare-ups. Still possible to adapt and function close to normal. It can be very  annoying and may have occasional stronger flare-ups. With discipline, patients may get used to it and adapt.   Moderate pain 3 Interferes significantly with activities of daily living (ADL). It becomes difficult to feed, bathe, get dressed, get on and off the toilet or to perform personal hygiene functions. Difficult to get in and out of bed or a chair without assistance. Very distracting. With effort, it can be ignored when deeply involved in activities.   Moderately severe pain 4 Impossible to ignore for more than a few minutes. With effort, patients may still be able to manage work or participate in some social activities. Very difficult to concentrate. Signs of autonomic nervous system discharge are evident: dilated pupils (mydriasis); mild sweating (diaphoresis); sleep interference. Heart rate becomes elevated (>115 bpm). Diastolic blood pressure (lower number) rises above 100 mmHg. Patients find relief in laying down and not moving.   Severe pain 5 Intense and extremely unpleasant. Associated with frowning face and frequent crying. Pain overwhelms the senses.  Ability to do any activity or maintain social relationships becomes significantly limited. Conversation becomes difficult. Pacing back and forth is common, as getting into a comfortable position is nearly impossible. Pain wakes you up from deep sleep. Physical signs will be obvious: pupillary dilation; increased sweating; goosebumps; brisk reflexes; cold, clammy hands and feet; nausea, vomiting or dry heaves; loss of appetite; significant sleep disturbance with inability to fall asleep or to remain asleep. When persistent, significant weight loss is observed due to the complete loss of appetite and sleep deprivation.  Blood pressure and heart   rate becomes significantly elevated. Caution: If elevated blood pressure triggers a pounding headache associated with blurred vision, then the patient should immediately seek attention at an urgent or  emergency care unit, as these may be signs of an impending stroke.    Emergency Department Pain Levels (6-10/10)  Emergency Room Pain 6 Severely limiting. Requires emergency care and should not be seen or managed at an outpatient pain management facility. Communication becomes difficult and requires great effort. Assistance to reach the emergency department may be required. Facial flushing and profuse sweating along with potentially dangerous increases in heart rate and blood pressure will be evident.   Distressing pain 7 Self-care is very difficult. Assistance is required to transport, or use restroom. Assistance to reach the emergency department will be required. Tasks requiring coordination, such as bathing and getting dressed become very difficult.   Disabling pain 8 Self-care is no longer possible. At this level, pain is disabling. The individual is unable to do even the most "basic" activities such as walking, eating, bathing, dressing, transferring to a bed, or toileting. Fine motor skills are lost. It is difficult to think clearly.   Incapacitating pain 9 Pain becomes incapacitating. Thought processing is no longer possible. Difficult to remember your own name. Control of movement and coordination are lost.   The worst pain imaginable 10 At this level, most patients pass out from pain. When this level is reached, collapse of the autonomic nervous system occurs, leading to a sudden drop in blood pressure and heart rate. This in turn results in a temporary and dramatic drop in blood flow to the brain, leading to a loss of consciousness. Fainting is one of the body's self defense mechanisms. Passing out puts the brain in a calmed state and causes it to shut down for a while, in order to begin the healing process.    Summary: 1. Refer to this scale when providing [place] with your pain level. 2. Be accurate and careful when reporting your pain level. This will help with your care. 3. Over-reporting  your pain level will lead to loss of credibility. 4. Even a level of 1/10 means that there is pain and will be treated at our facility. 5. High, inaccurate reporting will be documented as "Symptom Exaggeration", leading to loss of credibility and suspicions of possible secondary gains such as obtaining more narcotics, or wanting to appear disabled, for fraudulent reasons. 6. Only pain levels of 5 or below will be seen at our facility. 7. Pain levels of 6 and above will be sent to the Emergency Department and the appointment cancelled. _____________________________________________________________________________________________  ____________________________________________________________________________________________  Medication Rules  Applies to: All patients receiving prescriptions (written or electronic).  Pharmacy of record: Pharmacy where electronic prescriptions will be sent. If written prescriptions are taken to a different pharmacy, please inform the nursing staff. The pharmacy listed in the electronic medical record should be the one where you would like electronic prescriptions to be sent.  Prescription refills: Only during scheduled appointments. Applies to both, written and electronic prescriptions.  NOTE: The following applies primarily to controlled substances (Opioid Pain Medications)  Patient's responsibilities: 1. Pain Pills: Bring all pain pills to every appointment (except for procedure appointments). 2. Pill Bottles: Bring pills in original pharmacy bottle. Always bring newest bottle. Bring bottle, even if empty. 3. Medication refills: You are responsible for knowing and keeping track of what medications you need refilled. The day before your appointment, write a list of all prescriptions that need to be  refilled. Bring that list to your appointment and give it to the admitting nurse. Prescriptions will be written only during appointments. If you forget a medication, it  will not be "Called in", "Faxed", or "electronically sent". You will need to get another appointment to get these prescribed. 4. Prescription Accuracy: You are responsible for carefully inspecting your prescriptions before leaving our office. Have the discharge nurse carefully go over each prescription with you, before taking them home. Make sure that your name is accurately spelled, that your address is correct. Check the name and dose of your medication to make sure it is accurate. Check the number of pills, and the written instructions to make sure they are clear and accurate. Make sure that you are given enough medication to last until your next medication refill appointment. 5. Taking Medication: Take medication as prescribed. Never take more pills than instructed. Never take medication more frequently than prescribed. Taking less pills or less frequently is permitted and encouraged, when it comes to controlled substances (written prescriptions).  6. Inform other Doctors: Always inform, all of your healthcare providers, of all the medications you take. 7. Pain Medication from other Providers: You are not allowed to accept any additional pain medication from any other Doctor or Healthcare provider. There are two exceptions to this rule. (see below) In the event that you require additional pain medication, you are responsible for notifying us, as stated below. 8. Medication Agreement: You are responsible for carefully reading and following our Medication Agreement. This must be signed before receiving any prescriptions from our practice. Safely store a copy of your signed Agreement. Violations to the Agreement will result in no further prescriptions. (Additional copies of our Medication Agreement are available upon request.) 9. Laws, Rules, & Regulations: All patients are expected to follow all Federal and [employer], Rules, [employer]. Ignorance of the Laws does not constitute a valid  excuse.  Exceptions: There are only two exceptions to the rule of not receiving pain medications from other Healthcare Providers. 1. Exception #1 (Emergencies): In the event of an emergency (i.e.: accident requiring emergency care), you are allowed to receive additional pain medication. However, you are responsible for: As soon as you are able, call our office [phone], at any time of the day or night, and leave a message stating your name, the date and nature of the emergency, and the name and dose of the medication prescribed. In the event that your call is answered by a member of our staff, make sure to document and save the date, time, and the name of the person that took your information.  2. Exception #2 (Planned Surgery): In the event that you are scheduled by another doctor or dentist to have any type of surgery or procedure, you are allowed (for a period no longer than 30 days), to receive additional pain medication, for the acute post-op pain. However, in this case, you are responsible for picking up a copy of our "Post-op Pain Management for Surgeons" handout, and giving it to your surgeon or dentist. This document is available at our office, and does not require an appointment to obtain it. Simply go to our office during business hours (Monday-Thursday from 8:00 AM to 4:00 PM) (Friday 8:00 AM to 12:00 Noon) or if you have a scheduled appointment with [place], prior to your surgery, and ask for it by name. In addition, you will need to provide [place] with your name, name of your surgeon, type of surgery, and  date of procedure or surgery.  _____________________________________________________________________________________________Post-op Pain Management on a Chronic Pain Patient  Why should the surgeon manage his patient's post-op pain? The Surgeon is uniquely qualified to determine the amount of post-operative pain to be expected on a procedure or surgery that he/she has performed. Even with  similar surgeries, the surgeon's perspective on expected pain is unique, since he/she performed the procedure and knows the degree of difficulty and/or tissue damage involved in each particular case. The surgeon is also up to date on events such as blood loss, intraoperative complications, and PO (per orum) status that may influence not only the patient's dose and schedule, but route of administration as well.  How about telling chronic pain patients to just double up or increase their usual pain medication intake to compensate for the increased pain? This is a bad idea since it will lead to the patient running out of his/her usual medications early and this may create a problem at the level of the insurance, which supplies medications based on the amount and schedule stated on the prescription. Running out early may trigger an event where the refill is denied by the insurance company and/or pharmacy. In addition, this practice provides a very poor paper trail as to why this patient ran out of medication early. In addition, from the perspective of the pain physician, it creates a nightmare in the accounting of the patient's medication.  So, what should I do as a Psychologist, sport and exercise when confronted with a patient that needs surgery and already takes a significant amount of pain medicine for their chronic pain, which may or may not be related to the surgery I have to perform?  This is what the surgeon should do: 1. Do not change the dose or schedule of the pain medications prescribed by the pain specialist. This medication regimen allows for the patient's chronic pain to be under control, so as to bring that patient down to the level of an average individual. 2. Have the patient continue their usual pain management regimen, without any alterations. In addition, manage the post-operative pain as you would on any other "narcotic naive" patient. Do not attempt to compensate for tolerance. This is what the patient's usual  regimen will do for you. Simply treat the patient as if they had no chronic pain and as if they were taking no other pain medications. 3. Talk to the patient about the medication, just like you would for anyone else. Do not assume that they are experts in opioids. Make sure you let the patient know that the medication is to be used only if absolutely necessary. (PRN) 4. Prescribe the medication for as long as you would on any other patient undergoing the same type of surgery. Prescribe for the same average amount of time that you would on any other patient. Avoid prescribing for longer periods. 5. Send [place] a copy of the operative report with information about your choice of the post-op pain medication provided. 6. Keep [place] informed of any complications that may prolong the average duration patient's post-op pain.  If you have any questions, please feel to contact us at [phone]. _____________________________________________________________________________________________   Opioid Overdose Opioids are substances that relieve pain by binding to pain receptors in your brain and spinal cord. Opioids include illegal drugs, such as heroin, as well as prescription pain medicines.An opioid overdose happens when you take too much of an opioid substance. This can happen with any type of opioid, including:  Heroin.  Morphine.  Codeine.  Methadone.  Oxycodone.  Hydrocodone.  Fentanyl.  Hydromorphone.  Buprenorphine. The effects of an overdose can be mild, dangerous, or even deadly. Opioid overdose is a medical emergency. What are the causes? This condition may be caused by:  Taking too much of an opioid by accident.  Taking too much of an opioid on purpose.  An error made by a health care provider who prescribes a medicine.  An error made by the pharmacist who fills the prescription order.  Using more than one substance that contains opioids at the same time.  Mixing an opioid  with a substance that affects your heart, breathing, or blood pressure. These include alcohol, tranquilizers, sleeping pills, illegal drugs, and some over-the-counter medicines. What increases the risk? This condition is more likely in:  Children. They may be attracted to colorful pills. Because of a child's small size, even a small amount of a drug can be dangerous.  Elderly people. They may be taking many different drugs. Elderly people may have difficulty reading labels or remembering when they last took their medicine.  People who take an opioid on a long-term basis.  People who use:  Illegal drugs.  Other substances, including alcohol, while using an opioid.  People who have:  A history of drug or alcohol abuse.  Certain mental health conditions.  People who take opioids that are not prescribed for them. What are the signs or symptoms? Symptoms of this condition depend on the type of opioid and the amount that was taken. Common symptoms include:  Sleepiness or difficulty waking from sleep.  Confusion.  Slurred speech.  Slowed breathing and a slow pulse.  Nausea and vomiting.  Abnormally small pupils. Signs and symptoms that require emergency treatment include:  Cold, clammy, and pale skin.  Blue lips and fingernails.  Vomiting.  Gurgling sounds in the throat.  A pulse that is very slow or difficult to detect.  Breathing that is very slow, noisy, or difficult to detect.  Limp body.  Inability to respond to speech or be awakened from sleep (stupor). How is this diagnosed? This condition is diagnosed based on your symptoms. It is important to tell your health care provider:  All of the opioidsthat you took.  When you took the opioids.  Whether you were drinking alcohol or using other substances. Your health care provider will do a physical exam. This exam may include:  Checking and monitoring your heart rate and rhythm, your breathing rate and depth,  your temperature, and your blood pressure (vital signs).  Checking for abnormally small pupils.  Measuring oxygen levels in your blood. You may also have blood tests or urine tests. How is this treated? Supporting your vital signs and your breathing is the first step in treating an opioid overdose. Treatment may also include:  Giving fluids and minerals (electrolytes) through an IV tube.  Inserting a breathing tube (endotracheal tube) in your airway to help you breathe.  Giving oxygen.  Passing a tube through your nose and into your stomach (NG tube, or nasogastric tube) to wash out your stomach.  Giving medicines that:  Increase your blood pressure.  Absorb any opioid that is in your digestive system.  Reverse the effects of the opioid (naloxone).  Ongoing counseling and mental health support if you intentionally overdosed or used an illegal drug. Follow these instructions at home:  Take over-the-counter and prescription medicines only as told by your health care provider. Always ask your health care provider about possible side  effects and interactions of any new medicine that you start taking.  Keep a list of all of the medicines that you take, including over-the-counter medicines. Bring this list with you to all of your medical visits.  Drink enough fluid to keep your urine clear or pale yellow.  Keep all follow-up visits as told by your health care provider. This is important. How is this prevented?  Get help if you are struggling with:  Alcohol or drug use.  Depression or another mental health problem.  Keep the phone number of your local poison control center near your phone or on your cell phone.  Store all medicines in safety containers that are out of the reach of children.  Read the drug inserts that come with your medicines.  Do not drink alcohol when taking opioids.  Do not use illegal drugs.  Do not take opioid medicines that are not prescribed for  you. Contact a health care provider if:  Your symptoms return.  You develop new symptoms or side effects when you are taking medicines. Get help right away if:  You think that you or someone else may have taken too much of an opioid. The hotline of the [facility] is [phone].  You or someone else is having symptoms of an opioid overdose.  You have serious thoughts about hurting yourself or others.  You have:  Chest pain.  Difficulty breathing.  A loss of consciousness. Opioid overdose is an emergency. Do not wait to see if the symptoms will go away. Get medical help right away. Call your local emergency services (911 in the [place] Do not drive yourself to the hospital. This information is not intended to replace advice given to you by your health care provider. Make sure you discuss any questions you have with your health care provider. Document Released: 03/13/2004 Document Revised: 07/12/2015 Document Reviewed: 07/20/2014 Elsevier Interactive Patient Education  2017 [employer].

## 2016-06-25 ENCOUNTER — Telehealth: Payer: Self-pay | Admitting: *Deleted

## 2016-06-25 NOTE — Telephone Encounter (Signed)
Denies problems post pump refill.

## 2016-07-01 MED FILL — Medication: INTRATHECAL | Qty: 1 | Status: AC

## 2016-07-09 DIAGNOSIS — M19042 Primary osteoarthritis, left hand: Secondary | ICD-10-CM | POA: Insufficient documentation

## 2016-10-06 ENCOUNTER — Other Ambulatory Visit: Payer: Self-pay

## 2016-10-06 DIAGNOSIS — Z978 Presence of other specified devices: Secondary | ICD-10-CM

## 2016-10-06 DIAGNOSIS — G894 Chronic pain syndrome: Secondary | ICD-10-CM

## 2016-10-06 MED ORDER — PAIN MANAGEMENT IT PUMP REFILL: 1.0000 | Freq: Once | INTRATHECAL | 0 refills | Status: DC

## 2016-10-14 ENCOUNTER — Encounter: Payer: Self-pay | Admitting: Pain Medicine

## 2016-10-14 ENCOUNTER — Ambulatory Visit: Payer: Worker's Compensation | Attending: Pain Medicine | Admitting: Pain Medicine

## 2016-10-14 VITALS — BP 129/65 | HR 51 | Temp 97.8°F | Resp 18 | Ht 65.0 in | Wt 235.0 lb

## 2016-10-14 DIAGNOSIS — M5416 Radiculopathy, lumbar region: Secondary | ICD-10-CM | POA: Insufficient documentation

## 2016-10-14 DIAGNOSIS — M5441 Lumbago with sciatica, right side: Secondary | ICD-10-CM | POA: Insufficient documentation

## 2016-10-14 DIAGNOSIS — M79604 Pain in right leg: Secondary | ICD-10-CM | POA: Diagnosis not present

## 2016-10-14 DIAGNOSIS — R079 Chest pain, unspecified: Secondary | ICD-10-CM | POA: Diagnosis not present

## 2016-10-14 DIAGNOSIS — Z9689 Presence of other specified functional implants: Secondary | ICD-10-CM | POA: Insufficient documentation

## 2016-10-14 DIAGNOSIS — G894 Chronic pain syndrome: Secondary | ICD-10-CM

## 2016-10-14 DIAGNOSIS — Z978 Presence of other specified devices: Secondary | ICD-10-CM

## 2016-10-14 DIAGNOSIS — M961 Postlaminectomy syndrome, not elsewhere classified: Secondary | ICD-10-CM

## 2016-10-14 DIAGNOSIS — Z96652 Presence of left artificial knee joint: Secondary | ICD-10-CM | POA: Diagnosis not present

## 2016-10-14 DIAGNOSIS — Z462 Encounter for fitting and adjustment of other devices related to nervous system and special senses: Secondary | ICD-10-CM | POA: Insufficient documentation

## 2016-10-14 DIAGNOSIS — G8929 Other chronic pain: Secondary | ICD-10-CM

## 2016-10-14 DIAGNOSIS — Z451 Encounter for adjustment and management of infusion pump: Secondary | ICD-10-CM

## 2016-10-14 NOTE — Progress Notes (Signed)
Patient's Name: Brian Spencer  MRN: [id number]  Referring Provider: No ref. provider found  DOB: 04/10/1950  PCP: System, Pcp Not In  DOS: 10/14/2016  Note by:  A , MD  Service setting: Ambulatory outpatient  Specialty: Interventional Pain Management  Patient type: Established  Location: [facility] (AMB) Pain Management Facility  Visit type: Interventional Procedure   Primary Reason for Visit: Interventional Pain Management Treatment. CC: Back Pain (lower)  Procedure:  Intrathecal Drug Delivery System (IDDS):  Type: Reservoir Refill (95990) 10% rate decrease Region: Abdominal Laterality: Right  Type of Pump: Medtronic Synchromed II (MRI-compatible) Delivery Route: Intrathecal Type of Pain Treated: Neuropathic/Nociceptive Primary Medication Class: Opioid/opiate  Medication, Concentration, Infusion Program, & Delivery Rate: Please see scanned programming printout.  Indications: 1. Chronic pain syndrome   2. Chronic lumbar radicular pain (L5 dermatome) (Location of Secondary source of pain) (Right)   3. Chronic lower extremity pain (Location of Secondary source of pain) (Right)   4. Chronic low back pain (Location of Primary Source of Pain) (Bilateral) (R>L)   5. Failed back surgical syndrome   6. Presence of intrathecal pump   7. Encounter for interrogation of infusion pump    Pain Assessment: Self-Reported Pain Score: 4 /10             Reported level is compatible with observation.        Intrathecal Pump Therapy Assessment  Manufacturer: Medtronic Synchromed II Type: Programmable Volume: 40 mL reservoir MRI compatibility: Yes   Drug content:  Primary Medication Class: Opioid Primary Medication: PF-Hydromorphone (Dilaudid) Secondary Medication: PF-Clonidine Other Medication: None   Programming:  Type: Simple continuous. See pump readout for details.   Changes:  Medication Change: None at this point Rate Change: No change in rate  Reported side-effects or adverse  reactions: None reported  Effectiveness: Described as relatively effective, allowing for increase in activities of daily living (ADL) Clinically meaningful improvement in function (CMIF): Sustained CMIF goals met  Plan: Pump refill today  Pre-op Assessment:  Mr. Linenberger is a 66 y.o. (year old), male patient, seen today for interventional treatment. 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 Mr. Boullion has a current medication list which includes the following prescription(s): aspirin ec, chlorthalidone, clonazepam, coenzyme q10, hydromorphone hcl-nacl, multivitamin, tamsulosin, testosterone, tiotropium bromide-olodaterol, trazodone, venlafaxine xr, verapamil, ergocalciferol, and PAIN MANAGEMENT IT PUMP REFILL. His primarily concern today is the Back Pain (lower)  Initial Vital Signs: Blood pressure 129/65, pulse (!) 51, temperature 97.8 F (36.6 C), temperature source Oral, resp. rate 18, height 5' 5" (1.651 m), weight 235 lb (106.6 kg), SpO2 97 %. BMI: Estimated body mass index is 39.11 kg/m as calculated from the following:   Height as of this encounter: 5' 5" (1.651 m).   Weight as of this encounter: 235 lb (106.6 kg).  Risk Assessment: Allergies: Reviewed. He is allergic to  baclofen.  Allergy Precautions: None required Coagulopathies: Reviewed. None identified.  Blood-thinner therapy: None at this time Active Infection(s):   Reviewed. None identified. Mr. Capek is afebrile  Site Confirmation: Mr. Mckesson was asked to confirm the procedure and laterality before marking the site Procedure checklist: Completed Consent: Before the procedure and under the influence of no sedative(s), amnesic(s), or anxiolytics, the patient was informed of the treatment options, risks and possible complications. To fulfill our ethical and legal obligations, as recommended by the American Medical Association's Code of Ethics, I have informed the patient of my clinical impression; the nature and purpose of the treatment or procedure; the risks, benefits, and possible complications of the intervention; the alternatives, including doing nothing; the risk(s) and benefit(s) of the alternative treatment(s) or procedure(s); and the risk(s) and benefit(s) of doing nothing.  Mr. Basey was provided with information about the general risks and possible complications associated with most interventional procedures. These include, but are not limited to: failure to achieve desired goals, infection, bleeding, organ or nerve damage, allergic reactions, paralysis, and/or death.  In addition, he was informed of those risks and possible complications associated to this particular procedure, which include, but are not limited to: damage to the implant; failure to decrease pain; local, systemic, or serious CNS infections, intraspinal abscess with possible cord compression and paralysis, or life-threatening such as meningitis; bleeding; organ damage; nerve injury or damage with subsequent sensory, motor, and/or autonomic system dysfunction, resulting in transient or permanent pain, numbness, and/or weakness of one or several areas of the body; allergic reactions, either minor or major life-threatening, such as anaphylactic  or anaphylactoid reactions.  Furthermore, Mr. Holstad was informed of those risks and complications associated with the medications. These include, but are not limited to: allergic reactions (i.e.: anaphylactic or anaphylactoid reactions); endorphine suppression; bradycardia and/or hypotension; water retention and/or peripheral vascular relaxation leading to lower extremity edema and possible stasis ulcers; respiratory depression and/or shortness of breath; decreased metabolic rate leading to weight gain; swelling or edema; medication-induced neural toxicity; particulate matter embolism and blood vessel occlusion with resultant organ, and/or nervous system infarction; and/or intrathecal granuloma formation with possible spinal cord compression and permanent paralysis.  Before refilling the pump Mr. Carrier was informed that some of the medications used in the devise may not be FDA approved for such use and therefore it constitutes an off-label use of the medications.  Finally, he was informed that Medicine is not an exact science; therefore, there is also the possibility of unforeseen or unpredictable risks and/or possible complications that may result in a catastrophic outcome. The patient indicated having understood very clearly. We have given the patient no guarantees and we have made no promises. Enough time was given to the patient to ask questions, all of which were answered to the patient's satisfaction. Mr. Rathje has indicated that he wanted to continue with the procedure. Attestation: I, the ordering provider, attest that I have discussed with the patient the benefits, risks, side-effects, alternatives, likelihood of achieving goals, and potential problems during recovery for the procedure that I have provided informed consent. Date: 10/14/2016; Time: 12:21 PM  Pre-Procedure Preparation:  Monitoring: As per clinic protocol. Respiration, ETCO2, SpO2, BP, heart rate and rhythm monitor placed and checked  for adequate function Safety Precautions: Patient was assessed for positional comfort and pressure points before starting the procedure. Time-out: I initiated and conducted the "Time-out" before starting the procedure, as per protocol. The patient was asked to participate by confirming the accuracy of the "Time Out" information. Verification of the correct person, site, and procedure were performed and confirmed by me, the nursing staff,   and the patient. "Time-out" conducted as per Joint Commission's Universal Protocol (UP.01.01.01). "Time-out" Date & Time: 10/14/2016; 1230 (Time out redone) hrs.  Description of Procedure Process:   Position: Supine Target Area: Central-port of intrathecal pump. Approach: Anterior, 90 degree angle approach. Area Prepped: Entire Area around the pump implant. Prepping solution: ChloraPrep (2% chlorhexidine gluconate and 70% isopropyl alcohol) Safety Precautions: Aspiration looking for blood return was conducted prior to all injections. At no point did we inject any substances, as a needle was being advanced. No attempts were made at seeking any paresthesias. Safe injection practices and needle disposal techniques used. Medications properly checked for expiration dates. SDV (single dose vial) medications used. Description of the Procedure: Protocol guidelines were followed. Two nurses trained to do implant refills were present during the entire procedure. The refill medication was checked by both healthcare providers as well as the patient. The patient was included in the "Time-out" to verify the medication. The patient was placed in position. The pump was identified. The area was prepped in the usual manner. The sterile template was positioned over the pump, making sure the side-port location matched that of the pump. Both, the pump and the template were held for stability. The needle provided in the Medtronic Kit was then introduced thru the center of the template and into  the central port. The pump content was aspirated and discarded volume documented. The new medication was slowly infused into the pump, thru the filter, making sure to avoid overpressure of the device. The needle was then removed and the area cleansed, making sure to leave some of the prepping solution back to take advantage of its long term bactericidal properties. The pump was interrogated and programmed to reflect the correct medication, volume, and dosage. The program was printed and taken to the physician for approval. Once checked and signed by the physician, a copy was provided to the patient and another scanned into the EMR. Vitals:   10/14/16 1149  BP: 129/65  Pulse: (!) 51  Resp: 18  Temp: 97.8 F (36.6 C)  TempSrc: Oral  SpO2: 97%  Weight: 235 lb (106.6 kg)  Height: 5' 5" (1.651 m)    Start Time: 1232 hrs. End Time: 1247 hrs. Materials & Medications: Medtronic Refill Kit Medication(s): Please see chart orders for details.  Imaging Guidance:  Type of Imaging Technique: None used Indication(s): N/A Exposure Time: No patient exposure Contrast: None used. Fluoroscopic Guidance: N/A Ultrasound Guidance: N/A Interpretation: N/A  Antibiotic Prophylaxis:  Indication(s): None identified Antibiotic given: None  Post-operative Assessment:  EBL: None Complications: No immediate post-treatment complications observed by team, or reported by patient. Note: The patient tolerated the entire procedure well. A repeat set of vitals were taken after the procedure and the patient was kept under observation following institutional policy, for this type of procedure. Post-procedural neurological assessment was performed, showing return to baseline, prior to discharge. The patient was provided with post-procedure discharge instructions, including a section on how to identify potential problems. Should any problems arise concerning this procedure, the patient was given instructions to immediately  contact us, at any time, without hesitation. In any case, we plan to contact the patient by telephone for a follow-up status report regarding this interventional procedure. Comments:  No additional relevant information.  Plan of Care  Disposition: Discharge home  Discharge Date & Time: 10/14/2016; 1315 hrs.   Physician-requested Follow-up:  Return for Pump Refill (as per pump program).  Future Appointments Date Time Provider Department Center  02/04/2017   11:45 AM Milinda Pointer, MD [facility] None   Imaging Orders  No imaging studies ordered today    Procedure Orders     PUMP REFILL     PUMP REFILL     PUMP REPROGRAM  Medications ordered for procedure: No orders of the defined types were placed in this encounter.  Medications administered: Mr. Eggebrecht had no medications administered during this visit.  See the medical record for exact dosing, route, and time of administration.  New Prescriptions   No medications on file   Primary Care Physician: System, Pcp Not In Location: [facility] Note by: Gaspar Cola, MD Date: 10/14/2016; Time: 2:43 PM  Disclaimer:  Medicine is not an Chief Strategy Officer. The only guarantee in medicine is that nothing is guaranteed. It is important to note that the decision to proceed with this intervention was based on the information collected from the patient. The Data and conclusions were drawn from the patient's questionnaire, the interview, and the physical examination. Because the information was provided in large part by the patient, it cannot be guaranteed that it has not been purposely or unconsciously manipulated. Every effort has been made to obtain as much relevant data as possible for this evaluation. It is important to note that the conclusions that lead to this procedure are derived in large part from the available data. Always take into account that the treatment will also be dependent on availability of  resources and existing treatment guidelines, considered by other Pain Management Practitioners as being common knowledge and practice, at the time of the intervention. For Medico-Legal purposes, it is also important to point out that variation in procedural techniques and pharmacological choices are the acceptable norm. The indications, contraindications, technique, and results of the above procedure should only be interpreted and judged by a Board-Certified Interventional Pain Specialist with extensive familiarity and expertise in the same exact procedure and technique.

## 2016-10-14 NOTE — Progress Notes (Signed)
Safety precautions to be maintained throughout the outpatient stay will include: orient to surroundings, keep bed in low position, maintain call bell within reach at all times, provide assistance with transfer out of bed and ambulation.  

## 2016-10-15 ENCOUNTER — Telehealth: Payer: Self-pay | Admitting: *Deleted

## 2016-10-15 NOTE — Telephone Encounter (Signed)
No problems following pump refill. 

## 2016-10-21 ENCOUNTER — Telehealth: Payer: Self-pay | Admitting: Pain Medicine

## 2016-10-21 ENCOUNTER — Ambulatory Visit: Payer: Worker's Compensation | Admitting: Pain Medicine

## 2016-10-21 MED FILL — Medication: INTRATHECAL | Qty: 1 | Status: AC

## 2016-10-21 NOTE — Telephone Encounter (Signed)
Patient levmail on Mon at 6:22 am stating he is having problems with pain since his pump meds were adjusted. Please call patient to discuss

## 2016-10-21 NOTE — Telephone Encounter (Signed)
Patient states that he feels as though his meds need to be adjusted on his pump. He twisted his back and doesn't feel like his pump meds have been working as well since. Concerned about patient's increased pain and need to adjust medications, I scheduled the patient to come in on Dr. Adalberto Cole next day back in the office. Patient ok with this plan and understands that he can go to ED or PCP if he gets into an urgent situation with his pain or other concerns.

## 2016-10-28 ENCOUNTER — Encounter: Payer: Self-pay | Admitting: Pain Medicine

## 2016-10-28 ENCOUNTER — Ambulatory Visit: Payer: Worker's Compensation | Attending: Pain Medicine | Admitting: Pain Medicine

## 2016-10-28 VITALS — BP 121/55 | HR 69 | Temp 97.8°F | Resp 18 | Ht 64.0 in | Wt 235.0 lb

## 2016-10-28 DIAGNOSIS — M5441 Lumbago with sciatica, right side: Secondary | ICD-10-CM | POA: Diagnosis not present

## 2016-10-28 DIAGNOSIS — M8580 Other specified disorders of bone density and structure, unspecified site: Secondary | ICD-10-CM | POA: Diagnosis not present

## 2016-10-28 DIAGNOSIS — I708 Atherosclerosis of other arteries: Secondary | ICD-10-CM | POA: Insufficient documentation

## 2016-10-28 DIAGNOSIS — Z86711 Personal history of pulmonary embolism: Secondary | ICD-10-CM | POA: Diagnosis not present

## 2016-10-28 DIAGNOSIS — Z7982 Long term (current) use of aspirin: Secondary | ICD-10-CM | POA: Diagnosis not present

## 2016-10-28 DIAGNOSIS — Z981 Arthrodesis status: Secondary | ICD-10-CM | POA: Diagnosis not present

## 2016-10-28 DIAGNOSIS — G4700 Insomnia, unspecified: Secondary | ICD-10-CM | POA: Diagnosis not present

## 2016-10-28 DIAGNOSIS — M79604 Pain in right leg: Secondary | ICD-10-CM | POA: Diagnosis not present

## 2016-10-28 DIAGNOSIS — Z9981 Dependence on supplemental oxygen: Secondary | ICD-10-CM | POA: Diagnosis not present

## 2016-10-28 DIAGNOSIS — M79605 Pain in left leg: Secondary | ICD-10-CM | POA: Diagnosis not present

## 2016-10-28 DIAGNOSIS — G894 Chronic pain syndrome: Secondary | ICD-10-CM | POA: Diagnosis not present

## 2016-10-28 DIAGNOSIS — Z9889 Other specified postprocedural states: Secondary | ICD-10-CM

## 2016-10-28 DIAGNOSIS — F329 Major depressive disorder, single episode, unspecified: Secondary | ICD-10-CM | POA: Insufficient documentation

## 2016-10-28 DIAGNOSIS — Z87891 Personal history of nicotine dependence: Secondary | ICD-10-CM | POA: Diagnosis not present

## 2016-10-28 DIAGNOSIS — Z451 Encounter for adjustment and management of infusion pump: Secondary | ICD-10-CM

## 2016-10-28 DIAGNOSIS — Z9689 Presence of other specified functional implants: Secondary | ICD-10-CM | POA: Diagnosis not present

## 2016-10-28 DIAGNOSIS — Z79891 Long term (current) use of opiate analgesic: Secondary | ICD-10-CM | POA: Diagnosis not present

## 2016-10-28 DIAGNOSIS — B1920 Unspecified viral hepatitis C without hepatic coma: Secondary | ICD-10-CM | POA: Diagnosis not present

## 2016-10-28 DIAGNOSIS — M5442 Lumbago with sciatica, left side: Secondary | ICD-10-CM | POA: Insufficient documentation

## 2016-10-28 DIAGNOSIS — I10 Essential (primary) hypertension: Secondary | ICD-10-CM | POA: Insufficient documentation

## 2016-10-28 DIAGNOSIS — M545 Low back pain: Secondary | ICD-10-CM | POA: Diagnosis present

## 2016-10-28 DIAGNOSIS — Z462 Encounter for fitting and adjustment of other devices related to nervous system and special senses: Secondary | ICD-10-CM

## 2016-10-28 DIAGNOSIS — M4696 Unspecified inflammatory spondylopathy, lumbar region: Secondary | ICD-10-CM | POA: Diagnosis not present

## 2016-10-28 DIAGNOSIS — J449 Chronic obstructive pulmonary disease, unspecified: Secondary | ICD-10-CM | POA: Diagnosis not present

## 2016-10-28 DIAGNOSIS — Z902 Acquired absence of lung [part of]: Secondary | ICD-10-CM | POA: Diagnosis not present

## 2016-10-28 DIAGNOSIS — G8929 Other chronic pain: Secondary | ICD-10-CM

## 2016-10-28 DIAGNOSIS — R0902 Hypoxemia: Secondary | ICD-10-CM | POA: Diagnosis not present

## 2016-10-28 DIAGNOSIS — M47816 Spondylosis without myelopathy or radiculopathy, lumbar region: Secondary | ICD-10-CM | POA: Insufficient documentation

## 2016-10-28 DIAGNOSIS — Z978 Presence of other specified devices: Secondary | ICD-10-CM

## 2016-10-28 MED ORDER — PREDNISONE 20 MG PO TABS: ORAL_TABLET | ORAL | 0 refills | Status: AC

## 2016-10-28 NOTE — Progress Notes (Signed)
Safety precautions to be maintained throughout the outpatient stay will include: orient to surroundings, keep bed in low position, maintain call bell within reach at all times, provide assistance with transfer out of bed and ambulation. Intrathecal pump increased 10% witnessed by Burnett Harry rn.  Hydromorphone  Dose 6.492mg /day increased to 7.205 mg/day.

## 2016-10-28 NOTE — Progress Notes (Signed)
Patient's Name: Brian Spencer  MRN: [id number]  Referring Provider: No ref. provider found  DOB: 06-Aug-1950  PCP: System, Pcp Not In  DOS: 10/28/2016  Note by: Gaspar Cola, MD  Service setting: Ambulatory outpatient  Specialty: Interventional Pain Management  Location: [facility] (AMB) Pain Management Facility    Patient type: Established   Primary Reason(s) for Visit: Evaluation of chronic illnesses with exacerbation, or progression (Level of risk: moderate) CC: Back Pain (low)  HPI  Brian Spencer is a 66 y.o. year old, male patient, who comes today for a follow-up evaluation. 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 Brian Spencer was last seen on 10/21/2016. His primarily concern today is the Back Pain (low)  Pain Assessment: Location: Lower Back Radiating: radiates across low back into left buttock Onset: More than a month ago Duration: Chronic pain Quality: Aching Severity: 7 /10 (self-reported pain score)  Note: Reported level is compatible with observation.                   Effect on ADL:  worse at night in bed Timing: Constant  Further details on both, my assessment(s), as well as the proposed treatment plan, please see below.  Laboratory Chemistry  Inflammation Markers (CRP: Acute Phase) (ESR: Chronic Phase) Lab Results  Component Value Date   CRP 0.9 11/12/2015   ESRSEDRATE 14 11/12/2015                 Renal Function Markers Lab Results  Component Value Date   BUN 15 11/12/2015   CREATININE 0.83 11/12/2015   GFRAA >60 11/12/2015   GFRNONAA >60 11/12/2015                 Hepatic Function Markers Lab Results  Component Value Date   AST 20 11/12/2015   ALT 18 11/12/2015   ALBUMIN 4.0 11/12/2015   ALKPHOS 51 11/12/2015                 Electrolytes Lab Results  Component Value Date   NA 138 11/12/2015   K 3.9 11/12/2015   CL 101 11/12/2015   CALCIUM 8.9 11/12/2015   MG 2.0 11/12/2015                 Neuropathy Markers Lab Results  Component Value Date   VITAMINB12 1,143 (H) 11/12/2015                 Bone Pathology Markers Lab Results  Component Value Date   ALKPHOS 51 11/12/2015   25OHVITD1 39 11/12/2015   25OHVITD2 <1.0 11/12/2015   25OHVITD3 38 11/12/2015   CALCIUM 8.9 11/12/2015                 Coagulation Parameters  No results found for: INR, LABPROT, APTT, PLT               Cardiovascular Markers No results found for: BNP, HGB, HCT               Note: Lab results reviewed.  Recent Diagnostic Imaging Review  Dg Thoracic Spine 2 View Result Date: 11/13/2015 CLINICAL DATA:  Chronic upper lower back pain treated with a pain pump. Lumbar fusion in 2002, new patient visit with new position. No recent injury or change in pain level. EXAM: THORACIC SPINE 2 VIEWS COMPARISON:  None in PACs FINDINGS: The bones are subjectively osteopenic. The thoracic vertebral bodies are preserved in height. There is mild multilevel degenerative disc space narrowing with endplate osteophyte formation. The There is gentle levocurvature centered at T3. The  pedicles appear intact. There are no abnormal paravertebral soft tissue densities. pain pump catheter tip projects over the posterior aspect of the spinal canal at T10. IMPRESSION: Mild multilevel degenerative disc disease of the thoracic spine. No compression fracture. The pain pump catheter tip projects posterior to the T10 spinal canal. Electronically Signed   By: David  Martinique M.D.   On: 11/13/2015 07:35   Dg Lumbar Spine Complete W/bend Result Date: 11/13/2015 CLINICAL DATA:  Chronic upper lower back pain the treated with a pain pump. History of lumbar fusion in 2002, new patient visit. No recent injury or change in pain level. EXAM: LUMBAR SPINE - COMPLETE WITH BENDING VIEWS COMPARISON:  None in PACs FINDINGS: The patient has undergone posterior fusion at L4-5 and L5-S1 with intradiscal device placement at these levels. The metallic hardware appears intact. There is moderate disc space narrowing at L3-4 and moderate to severe narrowing at L2-3 with mild narrowing at L1-2. There are anterior bridging and near bridging osteophytes at L3-4 and L2-3. There is no spondylolisthesis. The pedicles of L1 through L3 appear intact as do the transverse processes at these levels. The observed portions of the sacrum are normal. A pain pump catheter tip terminates at approximately the T10 level. There is calcification of the wall of the abdominal aorta and iliac arteries. IMPRESSION: 1. Postfusion changes at L4-5 and L5-S1 with no evidence of hardware failure. 2. Significant disc space narrowing at L2-3 and L3-4 with prominent anterior bridging and near bridging osteophytes at these levels. Mild disc space narrowing at L1-2. No compression fracture. 3. Aortoiliac atherosclerosis. 4. Pain pump catheter tip terminating along the posterior aspect of the bony spinal canal at T10. Electronically Signed   By: David  Martinique M.D.   On: 11/13/2015 07:33   Thoracic Imaging: Thoracic DG 2-3 views:  Results for orders placed  during the hospital encounter of 11/12/15  DG Thoracic Spine 2 View   Narrative CLINICAL DATA:  Chronic upper lower back pain treated with a pain pump. Lumbar fusion in 2002, new patient visit with new position. No recent injury or change in pain level.  EXAM: THORACIC SPINE 2 VIEWS  COMPARISON:  None in PACs  FINDINGS: The bones are subjectively osteopenic. The thoracic vertebral bodies are preserved in height. There is mild multilevel degenerative disc space narrowing with endplate osteophyte formation. The There is gentle levocurvature centered at T3. The pedicles appear intact. There are no abnormal paravertebral soft tissue densities. pain pump catheter tip projects over the posterior aspect of the spinal canal at T10.  IMPRESSION: Mild multilevel degenerative disc disease of the thoracic spine. No compression fracture. The pain pump catheter tip projects posterior to the  T10 spinal canal.   Electronically Signed   By: David  Martinique M.D.   On: 11/13/2015 07:35    Lumbosacral Imaging: Lumbar DG Bending views:  Results for orders placed during the hospital encounter of 11/12/15  DG Lumbar Spine Complete W/Bend   Narrative CLINICAL DATA:  Chronic upper lower back pain the treated with a pain pump. History of lumbar fusion in 2002, new patient visit. No recent injury or change in pain level.  EXAM: LUMBAR SPINE - COMPLETE WITH BENDING VIEWS  COMPARISON:  None in PACs  FINDINGS: The patient has undergone posterior fusion at L4-5 and L5-S1 with intradiscal device placement at these levels. The metallic hardware appears intact. There is moderate disc space narrowing at L3-4 and moderate to severe narrowing at L2-3 with mild narrowing at L1-2. There are anterior bridging and near bridging osteophytes at L3-4 and L2-3. There is no spondylolisthesis. The pedicles of L1 through L3 appear intact as do the transverse processes at these levels. The observed portions of the  sacrum are normal. A pain pump catheter tip terminates at approximately the T10 level. There is calcification of the wall of the abdominal aorta and iliac arteries.  IMPRESSION: 1. Postfusion changes at L4-5 and L5-S1 with no evidence of hardware failure. 2. Significant disc space narrowing at L2-3 and L3-4 with prominent anterior bridging and near bridging osteophytes at these levels. Mild disc space narrowing at L1-2. No compression fracture. 3. Aortoiliac atherosclerosis. 4. Pain pump catheter tip terminating along the posterior aspect of the bony spinal canal at T10.   Electronically Signed   By: David  Martinique M.D.   On: 11/13/2015 07:33    Note: Results of ordered imaging test(s) reviewed and explained to patient in Layman's terms. Copy of results provided to patient  Meds   Current Outpatient Prescriptions:  .  aspirin EC 81 MG tablet, Take 81 mg by mouth daily. , Disp: , Rfl:  .  clonazePAM (KLONOPIN) 0.5 MG tablet, Take 0.5 mg by mouth daily., Disp: , Rfl:  .  Coenzyme Q10 (COQ10 PO), Take by mouth daily., Disp: , Rfl:  .  folic acid (FOLVITE) 1 MG tablet, Take 1 mg by mouth., Disp: , Rfl:  .  furosemide (LASIX) 40 MG tablet, Take 40 mg by mouth daily., Disp: , Rfl:  .  methotrexate (RHEUMATREX) 2.5 MG tablet, Take 7.5 mg by mouth. Caution:Chemotherapy. Protect from light., Disp: , Rfl:  .  Multiple Vitamin (MULTIVITAMIN) tablet, Take 1 tablet by mouth daily., Disp: , Rfl:  .  tamsulosin (FLOMAX) 0.4 MG CAPS capsule, Take 0.4 mg by mouth at bedtime., Disp: , Rfl:  .  Testosterone 200 MG PLLT, Inject 200 mg into the muscle every 14 (fourteen) days., Disp: , Rfl:  .  Tiotropium Bromide-Olodaterol 2.5-2.5 MCG/ACT AERS, Inhale 2 puffs into the lungs daily. , Disp: , Rfl:  .  traZODone (DESYREL) 100 MG tablet, Take 100 mg by mouth at bedtime., Disp: , Rfl:  .  venlafaxine XR (EFFEXOR-XR) 75 MG 24 hr capsule, Take 225 mg by mouth daily., Disp: , Rfl:  .  verapamil (CALAN) 120  MG tablet, Take 120 mg by mouth daily., Disp: , Rfl:  .  PAIN MANAGEMENT IT PUMP REFILL, 1 each by Intrathecal route once. Medication: Opioid: PF-Hydromorphone 25 mg/mL Adjuvant: PF-Clonidine 400 mcg/mL Volume: 40 ml Appointment Date: 10-21-16@1000 , Disp: 1 each, Rfl: 0 .  predniSONE (DELTASONE) 20 MG tablet, Take 3 tab(s) in the morning x 3 days, then 2 tab(s) x  3 days, followed by 1 tab x 3 days., Disp: 21 tablet, Rfl: 0  ROS  Constitutional: Denies any fever or chills Gastrointestinal: No reported hemesis, hematochezia, vomiting, or acute GI distress Musculoskeletal: Denies any acute onset joint swelling, redness, loss of ROM, or weakness Neurological: No reported episodes of acute onset apraxia, aphasia, dysarthria, agnosia, amnesia, paralysis, loss of coordination, or loss of consciousness  Allergies  Mr. Scioneaux is allergic to baclofen.  [place]  Drug: Mr. Dauphine  reports that he does not use drugs. Alcohol:  reports that he drinks about 1.2 oz of alcohol per week . Tobacco:  reports that he quit smoking about 3 years ago. His smoking use included Cigarettes. He has a 30.00 pack-year smoking history. He has never used smokeless tobacco. Medical:  has a past medical history of Allergy; Apnea, sleep; Cancer ([place]); Chest pain (01/22/2007); Hepatitis C virus (07/24/2003); Hypertension; Pneumonia (09/02/2002); Pulmonary embolism ([place]) (12/07/2006); and Severe frontal headaches (07/02/2007). Surgical: Mr. Gettis  has a past surgical history that includes Laminectomy (10/07/2000); Lumbar fusion (01/07/2001); Insertion of Medtronic  Spinal Cord Stimulator (12/28/2001); REmoval of Spinal cord stimulator; bone spurs removal (Bilateral, 2004); Rotary cuff surgery (Right, 04/18/2003); arthroscopic knee surgery (Left, 12/12/2003); Joint replacement; left total knee replacement (Left, 06/18/2004); medtronic pain infusion pump implanted  (08/27/2006); Upper gi endoscopy (07/23/2007); bone spurs (Left, 09/07/2007);  Colonoscopy (11/20/2007); chest pain  (01/29/2008); medtronic pump  stopped working (09/19/2008); Epidural block injection (02/20/2010); Implant 2, medtronic   bladder stimulators (06/11/2010); medtronic pain pump stimulator  (12/02/2013); Lobectomy (Right, 12/23/2013); and Joint replacement (Right, 2018). Family: family history includes COPD in his mother.  Constitutional Exam  General appearance: Well nourished, well developed, and well hydrated. In no apparent acute distress Vitals:   10/28/16 0749  BP: (!) 121/55  Pulse: 69  Resp: 18  Temp: 97.8 F (36.6 C)  SpO2: 97%  Weight: 235 lb (106.6 kg)  Height: 5' 4"  (1.626 m)   BMI Assessment: Estimated body mass index is 40.34 kg/m as calculated from the following:   Height as of this encounter: 5' 4"  (1.626 m).   Weight as of this encounter: 235 lb (106.6 kg).  BMI interpretation table: BMI level Category Range association with higher incidence of chronic pain  <18 kg/m2 Underweight   18.5-24.9 kg/m2 Ideal body weight   25-29.9 kg/m2 Overweight Increased incidence by 20%  30-34.9 kg/m2 Obese (Class I) Increased incidence by 68%  35-39.9 kg/m2 Severe obesity (Class II) Increased incidence by 136%  >40 kg/m2 Extreme obesity (Class III) Increased incidence by 254%   BMI Readings from Last 4 Encounters:  10/28/16 40.34 kg/m  10/14/16 39.11 kg/m  06/24/16 39.48 kg/m  03/04/16 42.97 kg/m   Wt Readings from Last 4 Encounters:  10/28/16 235 lb (106.6 kg)  10/14/16 235 lb (106.6 kg)  06/24/16 230 lb (104.3 kg)  03/04/16 220 lb (99.8 kg)  Psych/Mental status: Alert, oriented x 3 (person, place, & time)       Eyes: PERLA Respiratory: No evidence of acute respiratory distress  Cervical Spine Area Exam  Skin & Axial Inspection: No masses, redness, edema, swelling, or associated skin lesions Alignment: Symmetrical Functional ROM: Unrestricted ROM      Stability: No instability detected Muscle Tone/Strength: Functionally intact.  No obvious neuro-muscular anomalies detected. Sensory (Neurological): Unimpaired Palpation: No palpable anomalies              Upper Extremity (UE) Exam    Side: Right upper extremity  Side: Left upper extremity  Skin & Extremity Inspection: Skin color, temperature, and hair growth are WNL. No peripheral edema or cyanosis. No masses, redness, swelling, asymmetry, or associated skin lesions. No contractures.  Skin & Extremity Inspection: Skin color, temperature, and hair growth are WNL. No peripheral edema or cyanosis. No masses, redness, swelling, asymmetry, or associated skin lesions. No contractures.  Functional ROM: Unrestricted ROM          Functional ROM: Unrestricted ROM          Muscle Tone/Strength: Functionally intact. No obvious neuro-muscular anomalies detected.  Muscle Tone/Strength: Functionally intact. No obvious neuro-muscular anomalies detected.  Sensory (Neurological): Unimpaired          Sensory (Neurological): Unimpaired          Palpation: No palpable anomalies              Palpation: No palpable anomalies              Specialized Test(s): Deferred         Specialized Test(s): Deferred          Thoracic Spine Area Exam  Skin & Axial Inspection: No masses, redness, or swelling Alignment: Symmetrical Functional ROM: Unrestricted ROM Stability: No instability detected Muscle Tone/Strength: Functionally intact. No obvious neuro-muscular anomalies detected. Sensory (Neurological): Unimpaired Muscle strength & Tone: No palpable anomalies  Lumbar Spine Area Exam  Skin & Axial Inspection: Well healed scar from previous spine surgery detected Alignment: Symmetrical Functional ROM: Minimal ROM      Stability: No instability detected Muscle Tone/Strength: Functionally intact. No obvious neuro-muscular anomalies detected. Sensory (Neurological): Movement-associated pain Palpation: Increased muscle tone       Provocative Tests: Lumbar Hyperextension and rotation test: Positive  bilaterally for facet joint pain. Lumbar Lateral bending test: evaluation deferred today       Patrick's Maneuver: evaluation deferred today                    Gait & Posture Assessment  Ambulation: Patient ambulates using a walker Gait: Very limited, using assistive device to ambulate Posture: Antalgic   Lower Extremity Exam    Side: Right lower extremity  Side: Left lower extremity  Skin & Extremity Inspection: Skin color, temperature, and hair growth are WNL. No peripheral edema or cyanosis. No masses, redness, swelling, asymmetry, or associated skin lesions. No contractures.  Skin & Extremity Inspection: Skin color, temperature, and hair growth are WNL. No peripheral edema or cyanosis. No masses, redness, swelling, asymmetry, or associated skin lesions. No contractures.  Functional ROM: Unrestricted ROM          Functional ROM: Unrestricted ROM          Muscle Tone/Strength: Functionally intact. No obvious neuro-muscular anomalies detected.  Muscle Tone/Strength: Functionally intact. No obvious neuro-muscular anomalies detected.  Sensory (Neurological): Unimpaired  Sensory (Neurological): Unimpaired  Palpation: No palpable anomalies  Palpation: No palpable anomalies   Assessment  Primary Diagnosis & Pertinent Problem List: The primary encounter diagnosis was Acute bilateral low back pain with left-sided sciatica. Diagnoses of Chronic low back pain (Location of Primary Source of Pain) (Bilateral) (R>L), Chronic lower extremity pain (Location of Secondary source of pain) (Right), Lumbar facet joint syndrome ([place]), History of pneumonectomy (Right-sided), Encounter for interrogation of infusion pump, and Presence of intrathecal pump were also pertinent to this visit.  Status Diagnosis  Worsening Worsening Controlled 1. Acute bilateral low back pain with left-sided sciatica   2. Chronic low back pain (Location of Primary Source of Pain) (Bilateral) (R>L)  3. Chronic lower extremity pain  (Location of Secondary source of pain) (Right)   4. Lumbar facet joint syndrome ([employer])   5. History of pneumonectomy (Right-sided)   6. Encounter for interrogation of infusion pump   7. Presence of intrathecal pump     Problems updated and reviewed during this visit: Problem  Lumbar Facet Joint Syndrome (Hcc)  Acute Bilateral Low Back Pain With Left-Sided Sciatica  History of pneumonectomy (Right-sided)   Plan of Care  Pharmacotherapy (Medications Ordered): Meds ordered this encounter  Medications  . predniSONE (DELTASONE) 20 MG tablet    Sig: Take 3 tab(s) in the morning x 3 days, then 2 tab(s) x 3 days, followed by 1 tab x 3 days.    Dispense:  21 tablet    Refill:  0    Do not add to the "Automatic Refill" notification system.   New Prescriptions   PREDNISONE (DELTASONE) 20 MG TABLET    Take 3 tab(s) in the morning x 3 days, then 2 tab(s) x 3 days, followed by 1 tab x 3 days.   Medications administered today: Mr. Archey had no medications administered during this visit.   Procedure Orders     PUMP REPROGRAM     LUMBAR FACET(MEDIAL BRANCH NERVE BLOCK) [facility] Lab Orders  No laboratory test(s) ordered today   Imaging Orders  No imaging studies ordered today   Referral Orders  No referral(s) requested today    Interventional management options: Planned, scheduled, and/or pending:   Not at this time.   Considering:   Diagnostic bilateral lumbar facet blocks under fluoroscopic guidance and IV sedation.    Palliative PRN treatment(s):   Diagnostic bilateral lumbar facet blocks under fluoroscopic guidance and IV sedation.    Provider-requested follow-up: Return for Pump Refill (as per pump program).  Future Appointments Date Time Provider [place]  02/04/2017 11:45 AM Milinda Pointer, MD [facility] None   Primary Care Physician: System, Pcp Not In Location: [facility] Note by: Gaspar Cola, MD Date: 10/28/2016; Time:  1:10 PM

## 2016-10-28 NOTE — Patient Instructions (Signed)
Opioid Overdose Opioids are substances that relieve pain by binding to pain receptors in your brain and spinal cord. Opioids include illegal drugs, such as heroin, as well as prescription pain medicines.An opioid overdose happens when you take too much of an opioid substance. This can happen with any type of opioid, including:  Heroin.  Morphine.  Codeine.  Methadone.  Oxycodone.  Hydrocodone.  Fentanyl.  Hydromorphone.  Buprenorphine.  The effects of an overdose can be mild, dangerous, or even deadly. Opioid overdose is a medical emergency. What are the causes? This condition may be caused by:  Taking too much of an opioid by accident.  Taking too much of an opioid on purpose.  An error made by a health care provider who prescribes a medicine.  An error made by the pharmacist who fills the prescription order.  Using more than one substance that contains opioids at the same time.  Mixing an opioid with a substance that affects your heart, breathing, or blood pressure. These include alcohol, tranquilizers, sleeping pills, illegal drugs, and some over-the-counter medicines.  What increases the risk? This condition is more likely in:  Children. They may be attracted to colorful pills. Because of a child's small size, even a small amount of a drug can be dangerous.  Elderly people. They may be taking many different drugs. Elderly people may have difficulty reading labels or remembering when they last took their medicine.  People who take an opioid on a long-term basis.  People who use: ? Illegal drugs. ? Other substances, including alcohol, while using an opioid.  People who have: ? A history of drug or alcohol abuse. ? Certain mental health conditions.  People who take opioids that are not prescribed for them.  What are the signs or symptoms? Symptoms of this condition depend on the type of opioid and the amount that was taken. Common symptoms  include:  Sleepiness or difficulty waking from sleep.  Confusion.  Slurred speech.  Slowed breathing and a slow pulse.  Nausea and vomiting.  Abnormally small pupils.  Signs and symptoms that require emergency treatment include:  Cold, clammy, and pale skin.  Blue lips and fingernails.  Vomiting.  Gurgling sounds in the throat.  A pulse that is very slow or difficult to detect.  Breathing that is very slow, noisy, or difficult to detect.  Limp body.  Inability to respond to speech or be awakened from sleep (stupor).  How is this diagnosed? This condition is diagnosed based on your symptoms. It is important to tell your health care provider:  All of the opioidsthat you took.  When you took the opioids.  Whether you were drinking alcohol or using other substances.  Your health care provider will do a physical exam. This exam may include:  Checking and monitoring your heart rate and rhythm, your breathing rate and depth, your temperature, and your blood pressure (vital signs).  Checking for abnormally small pupils.  Measuring oxygen levels in your blood.  You may also have blood tests or urine tests. How is this treated? Supporting your vital signs and your breathing is the first step in treating an opioid overdose. Treatment may also include:  Giving fluids and minerals (electrolytes) through an IV tube.  Inserting a breathing tube (endotracheal tube) in your airway to help you breathe.  Giving oxygen.  Passing a tube through your nose and into your stomach (NG tube, or nasogastric tube) to wash out your stomach.  Giving medicines that: ? Increase your   blood pressure. ? Absorb any opioid that is in your digestive system. ? Reverse the effects of the opioid (naloxone).  Ongoing counseling and mental health support if you intentionally overdosed or used an illegal drug.  Follow these instructions at home:  Take over-the-counter and prescription  medicines only as told by your health care provider. Always ask your health care provider about possible side effects and interactions of any new medicine that you start taking.  Keep a list of all of the medicines that you take, including over-the-counter medicines. Bring this list with you to all of your medical visits.  Drink enough fluid to keep your urine clear or pale yellow.  Keep all follow-up visits as told by your health care provider. This is important. How is this prevented?  Get help if you are struggling with: ? Alcohol or drug use. ? Depression or another mental health problem.  Keep the phone number of your local poison control center near your phone or on your cell phone.  Store all medicines in safety containers that are out of the reach of children.  Read the drug inserts that come with your medicines.  Do not drink alcohol when taking opioids.  Do not use illegal drugs.  Do not take opioid medicines that are not prescribed for you. Contact a health care provider if:  Your symptoms return.  You develop new symptoms or side effects when you are taking medicines. Get help right away if:  You think that you or someone else may have taken too much of an opioid. The hotline of the [facility] is [phone].  You or someone else is having symptoms of an opioid overdose.  You have serious thoughts about hurting yourself or others.  You have: ? Chest pain. ? Difficulty breathing. ? A loss of consciousness. Opioid overdose is an emergency. Do not wait to see if the symptoms will go away. Get medical help right away. Call your local emergency services (911 in the [place] Do not drive yourself to the hospital. This information is not intended to replace advice given to you by your health care provider. Make sure you discuss any questions you have with your health care provider. Document Released: 03/13/2004 Document Revised: 07/12/2015 Document  Reviewed: 07/20/2014 Else[employer] Interactive Patient Education  2018 [place]. Intrathecal Pain Pump Information What is the intrathecal pain pump? An intrathecal pain pump is a pump that sends medicine into your spinal canal through a thin tube (catheter). You may need an intrathecal pain pump if you have long-term pain that cannot be managed in other ways. In some cases, these pumps can be used to deliver medicine that treats long-term muscle spasms (spasticity). If you are using the pump for only a short time, the pump may be outside of your body. For long-term use, the pump and the catheter are implanted inside your body. The pump is about the size of a hockey puck. It is usually placed under the skin of the abdomen. The catheter will run under your skin from your abdomen to your back. Your health care provider will program the pump to deliver pain medicine into your spinal canal continuously or at certain intervals. You may have a handheld device that allows you to give yourself a dose of pain medicine when needed. The pump runs on a battery. The battery will last 7-10 years, and then the pump will need to be replaced. What are the benefits? Opioid pain medicines are strong medicinesthat  are oftenused to control long-term and severe pain. When these medicines are placed in the fluid space (intrathecal space) that surrounds your spinal cord and brain, the medicine will block the pain signals that come from your body. This type of pump may be an option if you have long-term pain that cannot be managed with opioids that are taken by mouth or by injection. The pump may allow you to have better control of your pain with less medicine. What are the risks? You will need to have a surgical procedure to place the catheter and the pain pump. This surgery has some risks. There are also risks to long-term use of opioids. Before you can have the pump placed, you will need to go through a pain control trial.  Medicine will be placed into your intrathecal space to see if itcontrols your pain and to see how well you tolerate the medicine. Risks of pain pump implantation and use include:  Infection.  Bleeding.  Failure of the pump or the catheter.  Leaking of fluid from your spinal canal (cerebrospinal fluid leak).  A growth around the tip of the catheter inside your spinal canal (granuloma). This can put pressure on your spine.  A need to remove the pump if problems develop.  A need to replace the pump after 7-10 years.  Risks of long-term opioid use include:  A breathing problem that prevents you from getting enough oxygen (respiratory depression).  Physical and psychological dependence.  Constipation or difficulty passing urine.  Nausea and vomiting.  Mental changes, including feeling high, dizzy, drowsy, confused, depressed, or anxious.  How do I use the pump? Your health care provider will decide what type of pump is best for you.If you have a pump that gives a dose of medicine on demand, you may have a handheld device to trigger the release of your medicine. You will need to see your health care provider often to make sure the pump is programmed correctly for your pain. This programming may need to be changed over time. You will also need to have the pump resupplied with medicine. Your health care provider may do this at your routine visits. You should also know that:  Electronic devices or scans will not damage your pump.  You will need a device identification card to pass through security gates.  Your device will have an alarm to warn you of a malfunction or low battery power. The alarm will sound if you need a medicine refill. Contact your health care provider if your alarm sounds.  When should I seek medical care? Call your health care provider right away if:  You have severe constipation or trouble passing urine.  You have a severe headache or a headache that does  not go away after 2 days.  You have redness, swelling, or pain near your pump site.  Your pain is not controlled.  When should I seek immediate medical care? Seek emergency care or call your local emergency services (911 in the [place] if:  You have trouble breathing.  You have passed out or had a seizure.  You suddenly have severe back pain, leg weakness, or loss of bladder or bowel control (incontinence).  You feel very dizzy, confused, or drowsy.  This information is not intended to replace advice given to you by your health care provider. Make sure you discuss any questions you have with your health care provider. Document Released: 05/22/2008 Document Revised: 12/31/2015 Document Reviewed: 09/25/2014 [employer] Interactive Patient Education  2017 [employer]  

## 2016-10-29 ENCOUNTER — Telehealth: Payer: Self-pay

## 2016-10-29 NOTE — Telephone Encounter (Signed)
Instructed to call if needed. Denies any needs at this time.

## 2017-01-15 ENCOUNTER — Other Ambulatory Visit: Payer: Self-pay

## 2017-01-15 DIAGNOSIS — G894 Chronic pain syndrome: Secondary | ICD-10-CM

## 2017-01-15 DIAGNOSIS — Z978 Presence of other specified devices: Secondary | ICD-10-CM

## 2017-01-15 MED ORDER — PAIN MANAGEMENT IT PUMP REFILL: 1.0000 | Freq: Once | INTRATHECAL | 0 refills | Status: DC

## 2017-02-04 ENCOUNTER — Ambulatory Visit: Payer: Worker's Compensation | Attending: Pain Medicine | Admitting: Pain Medicine

## 2017-02-04 ENCOUNTER — Other Ambulatory Visit: Payer: Self-pay

## 2017-02-04 ENCOUNTER — Encounter: Payer: Self-pay | Admitting: Pain Medicine

## 2017-02-04 VITALS — BP 124/51 | HR 58 | Temp 98.1°F | Resp 16 | Ht 64.0 in | Wt 225.0 lb

## 2017-02-04 DIAGNOSIS — M545 Low back pain: Secondary | ICD-10-CM | POA: Diagnosis not present

## 2017-02-04 DIAGNOSIS — M5441 Lumbago with sciatica, right side: Secondary | ICD-10-CM

## 2017-02-04 DIAGNOSIS — M961 Postlaminectomy syndrome, not elsewhere classified: Secondary | ICD-10-CM

## 2017-02-04 DIAGNOSIS — Z5181 Encounter for therapeutic drug level monitoring: Secondary | ICD-10-CM | POA: Insufficient documentation

## 2017-02-04 DIAGNOSIS — G894 Chronic pain syndrome: Secondary | ICD-10-CM | POA: Insufficient documentation

## 2017-02-04 DIAGNOSIS — G8929 Other chronic pain: Secondary | ICD-10-CM

## 2017-02-04 DIAGNOSIS — Z79891 Long term (current) use of opiate analgesic: Secondary | ICD-10-CM | POA: Diagnosis not present

## 2017-02-04 DIAGNOSIS — Z451 Encounter for adjustment and management of infusion pump: Secondary | ICD-10-CM | POA: Insufficient documentation

## 2017-02-04 DIAGNOSIS — Z9689 Presence of other specified functional implants: Secondary | ICD-10-CM | POA: Diagnosis not present

## 2017-02-04 DIAGNOSIS — Z7982 Long term (current) use of aspirin: Secondary | ICD-10-CM | POA: Insufficient documentation

## 2017-02-04 DIAGNOSIS — M79604 Pain in right leg: Secondary | ICD-10-CM | POA: Insufficient documentation

## 2017-02-04 DIAGNOSIS — Z79899 Other long term (current) drug therapy: Secondary | ICD-10-CM | POA: Diagnosis not present

## 2017-02-04 DIAGNOSIS — Z978 Presence of other specified devices: Secondary | ICD-10-CM

## 2017-02-04 NOTE — Progress Notes (Signed)
Patient's Name: Brian Spencer  MRN: [id number]  Referring Provider: No ref. provider found  DOB: 11/09/1950  PCP: System, Pcp Not In  DOS: 02/04/2017  Note by: Gaspar Cola, MD  Service setting: Ambulatory outpatient  Specialty: Interventional Pain Management  Patient type: Established  Location: [facility] (AMB) Pain Management Facility  Visit type: Interventional Procedure   Primary Reason for Visit: Interventional Pain Management Treatment. CC: Back Pain (low)  Procedure:  Intrathecal Drug Delivery System (IDDS):  Type: Reservoir Refill [phone]) No rate change Region: Abdominal Laterality: Right  Type of Pump: Medtronic Synchromed II (MRI-compatible) Delivery Route: Intrathecal Type of Pain Treated: Neuropathic/Nociceptive Primary Medication Class: Opioid/opiate  Medication, Concentration, Infusion Program, & Delivery Rate: Please see scanned programming printout.   Indications: 1. Chronic pain syndrome   2. Failed back surgical syndrome   3. Chronic low back pain (Primary Source of Pain) (Bilateral) (R>L)   4. Chronic lower extremity pain (Secondary source of pain) (Right)   5. Presence of intrathecal pump   6. Encounter for adjustment or management of infusion pump    Pain Assessment: Self-Reported Pain Score: 4 /10             Reported level is compatible with observation.        Intrathecal Pump Therapy Assessment  Manufacturer: Medtronic Synchromed II Type: Programmable Volume: 40 mL reservoir MRI compatibility: Yes   Drug content:  Primary Medication Class: Opioid Primary Medication: PF-Hydromorphone (Dilaudid) Secondary Medication: PF-Clonidine Other Medication: None   Programming:  Type: Simple continuous. See pump readout for details.   Changes:  Medication Change: None at this point Rate Change: No change in rate  Reported side-effects or adverse reactions: None reported  Effectiveness: Described as relatively effective, allowing for increase in activities  of daily living (ADL) Clinically meaningful improvement in function (CMIF): Sustained CMIF goals met  Plan: Pump refill today  Pre-op Assessment:  Brian Spencer is a 66 y.o. (year old), male patient, seen today for interventional treatment. 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 Brian Spencer has a current medication list which includes the following prescription(s): aspirin ec, clonazepam, coenzyme C16, folic acid, furosemide, methotrexate, multivitamin, tamsulosin, testosterone, tiotropium bromide-olodaterol, trazodone, venlafaxine xr, verapamil, and PAIN MANAGEMENT IT PUMP REFILL. His primarily concern today is the Back Pain (low)  Initial Vital Signs: There were no vitals taken for this visit. BMI: Estimated body mass index is 38.62 kg/m as calculated from the following:   Height as of this encounter: 5' 4"  (1.626 m).   Weight as of this encounter: 225 lb (102.1 kg).  Risk Assessment: Allergies: Reviewed. He is allergic to baclofen.  Allergy Precautions: None required Coagulopathies: Reviewed. None identified.  Blood-thinner therapy: None at this time Active Infection(s): Reviewed. None identified. Brian Spencer is afebrile  Site Confirmation:  Brian Spencer was asked to confirm the procedure and laterality before marking the site Procedure checklist: Completed Consent: Before the procedure and under the influence  of no sedative(s), amnesic(s), or anxiolytics, the patient was informed of the treatment options, risks and possible complications. To fulfill our ethical and legal obligations, as recommended by the American Medical Association's Code of Ethics, I have informed the patient of my clinical impression; the nature and purpose of the treatment or procedure; the risks, benefits, and possible complications of the intervention; the alternatives, including doing nothing; the risk(s) and benefit(s) of the alternative treatment(s) or procedure(s); and the risk(s) and benefit(s) of doing nothing.  Brian Spencer was provided with information about the general risks and possible complications associated with most interventional procedures. These include, but are not limited to: failure to achieve desired goals, infection, bleeding, organ or nerve damage, allergic reactions, paralysis, and/or death.  In addition, he was informed of those risks and possible complications associated to this particular procedure, which include, but are not limited to: damage to the implant; failure to decrease pain; local, systemic, or serious CNS infections, intraspinal abscess with possible cord compression and paralysis, or life-threatening such as meningitis; bleeding; organ damage; nerve injury or damage with subsequent sensory, motor, and/or autonomic system dysfunction, resulting in transient or permanent pain, numbness, and/or weakness of one or several areas of the body; allergic reactions, either minor or major life-threatening, such as anaphylactic or anaphylactoid reactions.  Furthermore, Brian Spencer was informed of those risks and complications associated with the medications. These include, but are not limited to: allergic reactions (i.e.: anaphylactic or  anaphylactoid reactions); endorphine suppression; bradycardia and/or hypotension; water retention and/or peripheral vascular relaxation leading to lower extremity edema and possible stasis ulcers; respiratory depression and/or shortness of breath; decreased metabolic rate leading to weight gain; swelling or edema; medication-induced neural toxicity; particulate matter embolism and blood vessel occlusion with resultant organ, and/or nervous system infarction; and/or intrathecal granuloma formation with possible spinal cord compression and permanent paralysis.  Before refilling the pump Brian Spencer was informed that some of the medications used in the devise may not be FDA approved for such use and therefore it constitutes an off-label use of the medications.  Finally, he was informed that Medicine is not an exact science; therefore, there is also the possibility of unforeseen or unpredictable risks and/or possible complications that may result in a catastrophic outcome. The patient indicated having understood very clearly. We have given the patient no guarantees and we have made no promises. Enough time was given to the patient to ask questions, all of which were answered to the patient's satisfaction. Brian Spencer has indicated that he wanted to continue with the procedure. Attestation: I, the ordering provider, attest that I have discussed with the patient the benefits, risks, side-effects, alternatives, likelihood of achieving goals, and potential problems during recovery for the procedure that I have provided informed consent. Date: 02/04/2017; Time: 7:11 AM  Pre-Procedure Preparation:  Monitoring: As per clinic protocol. Respiration, ETCO2, SpO2, BP, heart rate and rhythm monitor placed and checked for adequate function Safety Precautions: Patient was assessed for positional comfort and pressure points before starting the procedure. Time-out: I initiated and conducted the "Time-out" before starting the  procedure, as per protocol. The patient was asked to participate by confirming the accuracy of the "Time Out" information. Verification of the correct person, site, and procedure were performed and confirmed by me, the nursing staff, and the patient. "Time-out" conducted as per Joint Commission's Universal Protocol (UP.01.01.01). "Time-out" Date & Time: 02/04/2017; 1108 hrs.  Description of Procedure Process:   Position: Supine Target Area: Central-port of intrathecal pump. Approach:  Anterior, 90 degree angle approach. Area Prepped: Entire Area around the pump implant. Prepping solution: ChloraPrep (2% chlorhexidine gluconate and 70% isopropyl alcohol) Safety Precautions: Aspiration looking for blood return was conducted prior to all injections. At no point did we inject any substances, as a needle was being advanced. No attempts were made at seeking any paresthesias. Safe injection practices and needle disposal techniques used. Medications properly checked for expiration dates. SDV (single dose vial) medications used. Description of the Procedure: Protocol guidelines were followed. Two nurses trained to do implant refills were present during the entire procedure. The refill medication was checked by both healthcare providers as well as the patient. The patient was included in the "Time-out" to verify the medication. The patient was placed in position. The pump was identified. The area was prepped in the usual manner. The sterile template was positioned over the pump, making sure the side-port location matched that of the pump. Both, the pump and the template were held for stability. The needle provided in the Medtronic Kit was then introduced thru the center of the template and into the central port. The pump content was aspirated and discarded volume documented. The new medication was slowly infused into the pump, thru the filter, making sure to avoid overpressure of the device. The needle was then  removed and the area cleansed, making sure to leave some of the prepping solution back to take advantage of its long term bactericidal properties. The pump was interrogated and programmed to reflect the correct medication, volume, and dosage. The program was printed and taken to the physician for approval. Once checked and signed by the physician, a copy was provided to the patient and another scanned into the EMR. Vitals:   02/04/17 1040 02/04/17 1042  BP:  (!) 124/51  Pulse: (!) 58   Resp: 16   Temp: 98.1 F (36.7 C)   SpO2: 97%   Weight: 225 lb (102.1 kg)   Height: 5' 4"  (1.626 m)     Start Time: 1108 hrs. End Time:   hrs. Materials & Medications: Medtronic Refill Kit Medication(s): Please see chart orders for details.  Imaging Guidance:  Type of Imaging Technique: None used Indication(s): N/A Exposure Time: No patient exposure Contrast: None used. Fluoroscopic Guidance: N/A Ultrasound Guidance: N/A Interpretation: N/A  Antibiotic Prophylaxis:  Indication(s): None identified Antibiotic given: None  Post-operative Assessment:  EBL: None Complications: No immediate post-treatment complications observed by team, or reported by patient. Note: The patient tolerated the entire procedure well. A repeat set of vitals were taken after the procedure and the patient was kept under observation following institutional policy, for this type of procedure. Post-procedural neurological assessment was performed, showing return to baseline, prior to discharge. The patient was provided with post-procedure discharge instructions, including a section on how to identify potential problems. Should any problems arise concerning this procedure, the patient was given instructions to immediately contact us, at any time, without hesitation. In any case, we plan to contact the patient by telephone for a follow-up status report regarding this interventional procedure. Comments:  No additional relevant  information.  Plan of Care   Imaging Orders  No imaging studies ordered today    Procedure Orders     PUMP REFILL     PUMP REFILL  Medications ordered for procedure: No orders of the defined types were placed in this encounter.  Medications administered: Brian Spencer had no medications administered during this visit.  See the medical record for exact dosing, route, and  time of administration.  This SmartLink is deprecated. Use AVSMEDLIST instead to display the medication list for a patient. Disposition: Discharge home  Discharge Date & Time: 02/04/2017;   hrs.   Physician-requested Follow-up: Return for Pump Refill (as per pump program). Future Appointments  Date Time Provider [place]  05/27/2017 11:45 AM Milinda Pointer, MD [facility] None   Primary Care Physician: System, Pcp Not In Location: [facility] Note by: Gaspar Cola, MD Date: 02/04/2017; Time: 8:02 PM  Disclaimer:  Medicine is not an Chief Strategy Officer. The only guarantee in medicine is that nothing is guaranteed. It is important to note that the decision to proceed with this intervention was based on the information collected from the patient. The Data and conclusions were drawn from the patient's questionnaire, the interview, and the physical examination. Because the information was provided in large part by the patient, it cannot be guaranteed that it has not been purposely or unconsciously manipulated. Every effort has been made to obtain as much relevant data as possible for this evaluation. It is important to note that the conclusions that lead to this procedure are derived in large part from the available data. Always take into account that the treatment will also be dependent on availability of resources and existing treatment guidelines, considered by other Pain Management Practitioners as being common knowledge and practice, at the time of the intervention. For  Medico-Legal purposes, it is also important to point out that variation in procedural techniques and pharmacological choices are the acceptable norm. The indications, contraindications, technique, and results of the above procedure should only be interpreted and judged by a Board-Certified Interventional Pain Specialist with extensive familiarity and expertise in the same exact procedure and technique.

## 2017-02-04 NOTE — Progress Notes (Addendum)
Safety precautions to be maintained throughout the outpatient stay will include: orient to surroundings, keep bed in low position, maintain call bell within reach at all times, provide assistance with transfer out of bed and ambulation. Intrathecal pump refilled under sterile technique without difficulty witnessed by Burnett Harry RN.  12 ml wasted in sink.

## 2017-05-04 ENCOUNTER — Other Ambulatory Visit: Payer: Self-pay

## 2017-05-04 DIAGNOSIS — G894 Chronic pain syndrome: Secondary | ICD-10-CM

## 2017-05-04 DIAGNOSIS — Z978 Presence of other specified devices: Secondary | ICD-10-CM

## 2017-05-04 MED ORDER — PAIN MANAGEMENT IT PUMP REFILL: 1.0000 | Freq: Once | INTRATHECAL | 0 refills | Status: DC

## 2017-05-20 DIAGNOSIS — K2270 Barrett's esophagus without dysplasia: Secondary | ICD-10-CM | POA: Insufficient documentation

## 2017-05-20 DIAGNOSIS — Z0000 Encounter for general adult medical examination without abnormal findings: Secondary | ICD-10-CM | POA: Insufficient documentation

## 2017-05-20 DIAGNOSIS — K22719 Barrett's esophagus with dysplasia, unspecified: Secondary | ICD-10-CM | POA: Insufficient documentation

## 2017-05-20 DIAGNOSIS — Z0289 Encounter for other administrative examinations: Secondary | ICD-10-CM | POA: Insufficient documentation

## 2017-05-27 ENCOUNTER — Encounter: Payer: Self-pay | Admitting: Pain Medicine

## 2017-05-27 ENCOUNTER — Ambulatory Visit: Payer: Worker's Compensation | Attending: Pain Medicine | Admitting: Pain Medicine

## 2017-05-27 VITALS — BP 135/68 | HR 54 | Temp 98.2°F | Resp 16 | Ht 65.0 in | Wt 230.0 lb

## 2017-05-27 DIAGNOSIS — Z451 Encounter for adjustment and management of infusion pump: Secondary | ICD-10-CM | POA: Insufficient documentation

## 2017-05-27 DIAGNOSIS — G894 Chronic pain syndrome: Secondary | ICD-10-CM | POA: Insufficient documentation

## 2017-05-27 DIAGNOSIS — Z79899 Other long term (current) drug therapy: Secondary | ICD-10-CM | POA: Insufficient documentation

## 2017-05-27 DIAGNOSIS — M79604 Pain in right leg: Secondary | ICD-10-CM | POA: Diagnosis not present

## 2017-05-27 DIAGNOSIS — M961 Postlaminectomy syndrome, not elsewhere classified: Secondary | ICD-10-CM | POA: Insufficient documentation

## 2017-05-27 DIAGNOSIS — M5441 Lumbago with sciatica, right side: Secondary | ICD-10-CM | POA: Insufficient documentation

## 2017-05-27 DIAGNOSIS — G8929 Other chronic pain: Secondary | ICD-10-CM

## 2017-05-27 DIAGNOSIS — Z96652 Presence of left artificial knee joint: Secondary | ICD-10-CM | POA: Diagnosis not present

## 2017-05-27 DIAGNOSIS — Z888 Allergy status to other drugs, medicaments and biological substances status: Secondary | ICD-10-CM | POA: Insufficient documentation

## 2017-05-27 NOTE — Progress Notes (Signed)
Patient's Name: Brian Spencer  MRN: [id number]  Referring Provider: No ref. provider found  DOB: 09/29/1950  PCP: System, Pcp Not In  DOS: 05/27/2017  Note by: Francisco A Naveira, MD  Service setting: Ambulatory outpatient  Specialty: Interventional Pain Management  Patient type: Established  Location: [facility] (AMB) Pain Management Facility  Visit type: Interventional Procedure   Primary Reason for Visit: Interventional Pain Management Treatment. CC: Back Pain (lower bilateral)  Procedure:  Intrathecal Drug Delivery System (IDDS):  Type: Reservoir Refill (95990) 10% rate decrease Region: Abdominal Laterality: Right  Type of Pump: Medtronic Synchromed II (MRI-compatible) Delivery Route: Intrathecal Type of Pain Treated: Neuropathic/Nociceptive Primary Medication Class: Opioid/opiate  Medication, Concentration, Infusion Program, & Delivery Rate: Please see scanned programming printout.   Indications: 1. Chronic pain syndrome   2. Chronic low back pain (Primary Source of Pain) (Bilateral) (R>L)   3. Chronic lower extremity pain (Secondary source of pain) (Right)   4. Encounter for adjustment or management of infusion pump   5. Failed back surgical syndrome    Pain Assessment: Self-Reported Pain Score: 5 /10             Reported level is compatible with observation.        Intrathecal Pump Therapy Assessment  Manufacturer: Medtronic Synchromed II Type: Programmable Volume: 40 mL reservoir MRI compatibility: Yes   Drug content:  Primary Medication Class: Opioid Primary Medication: PF-Hydromorphone (Dilaudid) Secondary Medication: PF-Clonidine Other Medication: None   Programming:  Type: Simple continuous. See pump readout for details.   Changes:  Medication Change: None at this point Rate Change: No change in rate  Reported side-effects or adverse reactions: None reported  Effectiveness: Described as relatively effective, allowing for increase in activities of daily living  (ADL) Clinically meaningful improvement in function (CMIF): Sustained CMIF goals met  Plan: Pump refill today  Pre-op Assessment:  Brian Spencer is a 66 y.o. (year old), male patient, seen today for interventional treatment. 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 Brian Spencer has a current medication list which includes the following prescription(s): albuterol, aspirin ec, clonazepam, coenzyme q10, furosemide, multivitamin, pantoprazole, propranolol, tamsulosin, tiotropium bromide-olodaterol, trazodone, venlafaxine xr, verapamil, and PAIN MANAGEMENT IT PUMP REFILL. His primarily concern today is the Back Pain (lower bilateral)  Initial Vital Signs:  Pulse Rate: (!) 54 Temp: 98.2 F (36.8 C) Resp: 16 BP: 135/68 SpO2: 97 %  BMI: Estimated body mass index is 38.27 kg/m as calculated from the following:   Height as of this encounter: 5' 5" (1.651 m).   Weight as of this encounter: 230 lb (104.3 kg).  Risk Assessment: Allergies: Reviewed. He is allergic to baclofen.  Allergy Precautions: None required Coagulopathies: Reviewed. None identified.  Blood-thinner therapy: None at this time Active Infection(s): Reviewed. None identified. Brian Spencer  is afebrile  Site Confirmation: Brian Spencer was asked to confirm the procedure and laterality before marking the site Procedure checklist: Completed Consent: Before the procedure   and under the influence of no sedative(s), amnesic(s), or anxiolytics, the patient was informed of the treatment options, risks and possible complications. To fulfill our ethical and legal obligations, as recommended by the American Medical Association's Code of Ethics, I have informed the patient of my clinical impression; the nature and purpose of the treatment or procedure; the risks, benefits, and possible complications of the intervention; the alternatives, including doing nothing; the risk(s) and benefit(s) of the alternative treatment(s) or procedure(s); and the risk(s) and benefit(s) of doing nothing.  Brian Spencer was provided with information about the general risks and possible complications associated with most interventional procedures. These include, but are not limited to: failure to achieve desired goals, infection, bleeding, organ or nerve damage, allergic reactions, paralysis, and/or death.  In addition, he was informed of those risks and possible complications associated to this particular procedure, which include, but are not limited to: damage to the implant; failure to decrease pain; local, systemic, or serious CNS infections, intraspinal abscess with possible cord compression and paralysis, or life-threatening such as meningitis; bleeding; organ damage; nerve injury or damage with subsequent sensory, motor, and/or autonomic system dysfunction, resulting in transient or permanent pain, numbness, and/or weakness of one or several areas of the body; allergic reactions, either minor or major life-threatening, such as anaphylactic or anaphylactoid reactions.  Furthermore, Brian Spencer was informed of those risks and complications associated with the medications. These include, but are not limited to: allergic reactions  (i.e.: anaphylactic or anaphylactoid reactions); endorphine suppression; bradycardia and/or hypotension; water retention and/or peripheral vascular relaxation leading to lower extremity edema and possible stasis ulcers; respiratory depression and/or shortness of breath; decreased metabolic rate leading to weight gain; swelling or edema; medication-induced neural toxicity; particulate matter embolism and blood vessel occlusion with resultant organ, and/or nervous system infarction; and/or intrathecal granuloma formation with possible spinal cord compression and permanent paralysis.  Before refilling the pump Mr. Weinreb was informed that some of the medications used in the devise may not be FDA approved for such use and therefore it constitutes an off-label use of the medications.  Finally, he was informed that Medicine is not an exact science; therefore, there is also the possibility of unforeseen or unpredictable risks and/or possible complications that may result in a catastrophic outcome. The patient indicated having understood very clearly. We have given the patient no guarantees and we have made no promises. Enough time was given to the patient to ask questions, all of which were answered to the patient's satisfaction. Mr. Buckman has indicated that he wanted to continue with the procedure. Attestation: I, the ordering provider, attest that I have discussed with the patient the benefits, risks, side-effects, alternatives, likelihood of achieving goals, and potential problems during recovery for the procedure that I have provided informed consent. Date  Time: 05/27/2017 11:31 AM  Pre-Procedure Preparation:  Monitoring: As per clinic protocol. Respiration, ETCO2, SpO2, BP, heart rate and rhythm monitor placed and checked for adequate function Safety Precautions: Patient was assessed for positional comfort and pressure points before starting the procedure. Time-out: I initiated and conducted the "Time-out"  before starting the procedure, as per protocol. The patient was asked to participate by confirming the accuracy of the "Time Out" information. Verification of the correct person, site, and procedure were performed and confirmed by me, the nursing staff, and the patient. "Time-out" conducted as per Joint Commission's Universal Protocol (UP.01.01.01). Time: 1220  Description of Procedure Process:   Position: Supine Target Area: Central-port of intrathecal pump. Approach:  Anterior, 90 degree angle approach. Area Prepped: Entire Area around the pump implant. Prepping solution: ChloraPrep (2% chlorhexidine gluconate and 70% isopropyl alcohol) Safety Precautions: Aspiration looking for blood return was conducted prior to all injections. At no point did we inject any substances, as a needle was being advanced. No attempts were made at seeking any paresthesias. Safe injection practices and needle disposal techniques used. Medications properly checked for expiration dates. SDV (single dose vial) medications used. Description of the Procedure: Protocol guidelines were followed. Two nurses trained to do implant refills were present during the entire procedure. The refill medication was checked by both healthcare providers as well as the patient. The patient was included in the "Time-out" to verify the medication. The patient was placed in position. The pump was identified. The area was prepped in the usual manner. The sterile template was positioned over the pump, making sure the side-port location matched that of the pump. Both, the pump and the template were held for stability. The needle provided in the Medtronic Kit was then introduced thru the center of the template and into the central port. The pump content was aspirated and discarded volume documented. The new medication was slowly infused into the pump, thru the filter, making sure to avoid overpressure of the device. The needle was then removed and the area  cleansed, making sure to leave some of the prepping solution back to take advantage of its long term bactericidal properties. The pump was interrogated and programmed to reflect the correct medication, volume, and dosage. The program was printed and taken to the physician for approval. Once checked and signed by the physician, a copy was provided to the patient and another scanned into the EMR. Vitals:   05/27/17 1131  BP: 135/68  Pulse: (!) 54  Resp: 16  Temp: 98.2 F (36.8 C)  TempSrc: Temporal  SpO2: 97%  Weight: 230 lb (104.3 kg)  Height: 5' 5" (1.651 m)    Start Time: 1232 hrs. End Time: 1249 hrs. Materials & Medications: Medtronic Refill Kit Medication(s): Please see chart orders for details.  Imaging Guidance:  Type of Imaging Technique: None used Indication(s): N/A Exposure Time: No patient exposure Contrast: None used. Fluoroscopic Guidance: N/A Ultrasound Guidance: N/A Interpretation: N/A  Antibiotic Prophylaxis:   Anti-infectives (From admission, onward)   None     Indication(s): None identified  Post-operative Assessment:  Post-procedure Vital Signs:  Pulse Rate: (!) 54 Temp: 98.2 F (36.8 C) Resp: 16 BP: 135/68 SpO2: 97 %  EBL: None  Complications: No immediate post-treatment complications observed by team, or reported by patient.  Note: The patient tolerated the entire procedure well. A repeat set of vitals were taken after the procedure and the patient was kept under observation following institutional policy, for this type of procedure. Post-procedural neurological assessment was performed, showing return to baseline, prior to discharge. The patient was provided with post-procedure discharge instructions, including a section on how to identify potential problems. Should any problems arise concerning this procedure, the patient was given instructions to immediately contact us, at any time, without hesitation. In any case, we plan to contact the patient  by telephone for a follow-up status report regarding this interventional procedure.  Comments:  No additional relevant information.  Plan of Care   Imaging Orders  No imaging studies ordered today    Procedure Orders     PUMP REFILL     PUMP REFILL     PUMP REPROGRAM  Medications ordered for procedure: No orders  of the defined types were placed in this encounter.  Medications administered: Aqeel Norgaard had no medications administered during this visit.  See the medical record for exact dosing, route, and time of administration.  New Prescriptions   No medications on file   Disposition: Discharge home  Discharge Date & Time: 05/27/2017  Physician-requested Follow-up: Return for Pump Refill (as per pump program) (Max:26mo.  Future Appointments  Date Time Provider DAhmeek 09/30/2017 10:30 AM KVevelyn Francois [facility]   Primary Care Physician: System, Pcp Not In [facility] Note by: FGaspar Cola MD Date: 05/27/2017; Time: 1:25 PM  Disclaimer:  Medicine is not an exact science. The only guarantee in medicine is that nothing is guaranteed. It is important to note that the decision to proceed with this intervention was based on the information collected from the patient. The Data and conclusions were drawn from the patient's questionnaire, the interview, and the physical examination. Because the information was provided in large part by the patient, it cannot be guaranteed that it has not been purposely or unconsciously manipulated. Every effort has been made to obtain as much relevant data as possible for this evaluation. It is important to note that the conclusions that lead to this procedure are derived in large part from the available data. Always take into account that the treatment will also be dependent on availability of resources and existing treatment guidelines, considered by other Pain Management Practitioners  as being common knowledge and practice, at the time of the intervention. For Medico-Legal purposes, it is also important to point out that variation in procedural techniques and pharmacological choices are the acceptable norm. The indications, contraindications, technique, and results of the above procedure should only be interpreted and judged by a Board-Certified Interventional Pain Specialist with extensive familiarity and expertise in the same exact procedure and technique.

## 2017-05-27 NOTE — Progress Notes (Signed)
Safety precautions to be maintained throughout the outpatient stay will include: orient to surroundings, keep bed in low position, maintain call bell within reach at all times, provide assistance with transfer out of bed and ambulation.  

## 2017-08-27 ENCOUNTER — Other Ambulatory Visit: Payer: Self-pay

## 2017-08-27 DIAGNOSIS — Z978 Presence of other specified devices: Secondary | ICD-10-CM

## 2017-08-27 DIAGNOSIS — G894 Chronic pain syndrome: Secondary | ICD-10-CM

## 2017-08-27 MED ORDER — PAIN MANAGEMENT IT PUMP REFILL: 1.0000 | Freq: Once | INTRATHECAL | 0 refills | Status: DC

## 2017-09-06 DIAGNOSIS — I2699 Other pulmonary embolism without acute cor pulmonale: Secondary | ICD-10-CM

## 2017-09-06 HISTORY — DX: Other pulmonary embolism without acute cor pulmonale: I26.99

## 2017-09-13 IMAGING — CR DG LUMBAR SPINE COMPLETE W/ BEND
1 series · 7 of 7 positions shown · non-contrast
Comparison: None in PACs

CLINICAL DATA: Chronic upper lower back pain the treated with a
pain pump. History of lumbar fusion in 8668, new patient visit. No
recent injury or change in pain level.

EXAM:
LUMBAR SPINE - COMPLETE WITH BENDING VIEWS

[Series 1: dg lumbar spine complete w/bend 6+v · 0.14mm/px · 7 of 7 slices shown]
[im 1/7]
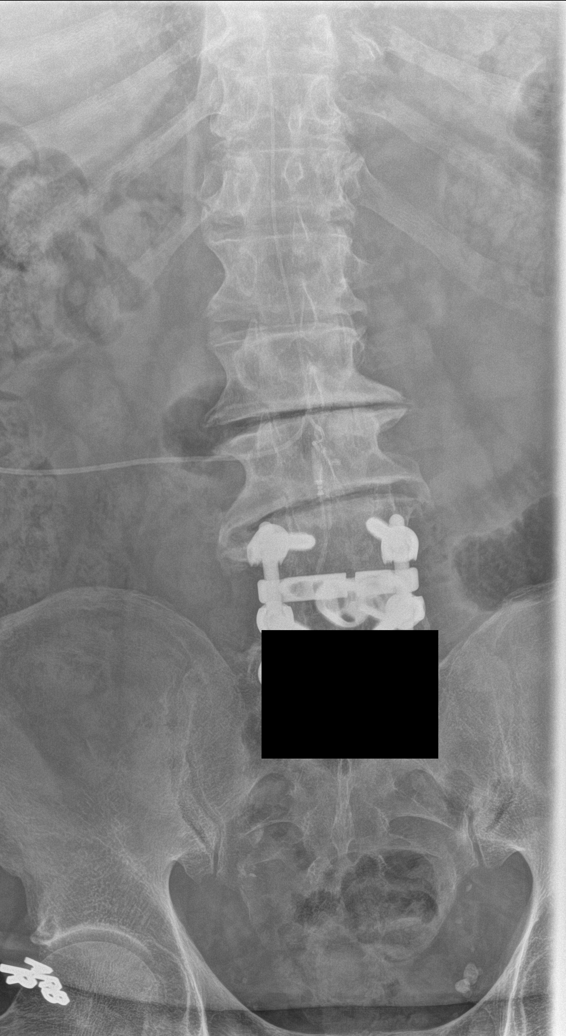
[im 2/7]
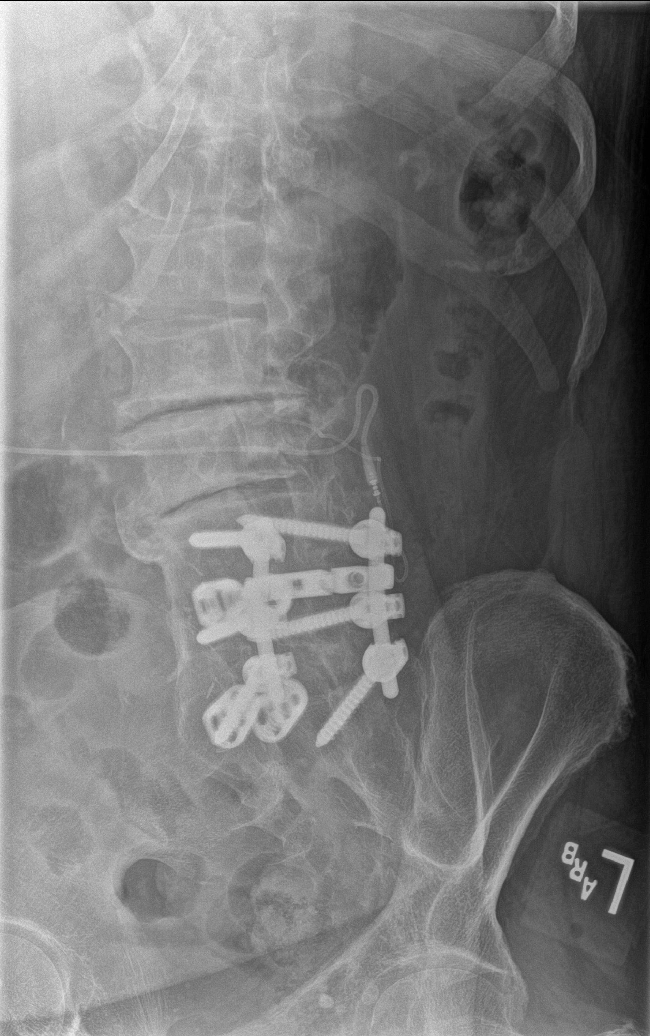
[im 3/7]
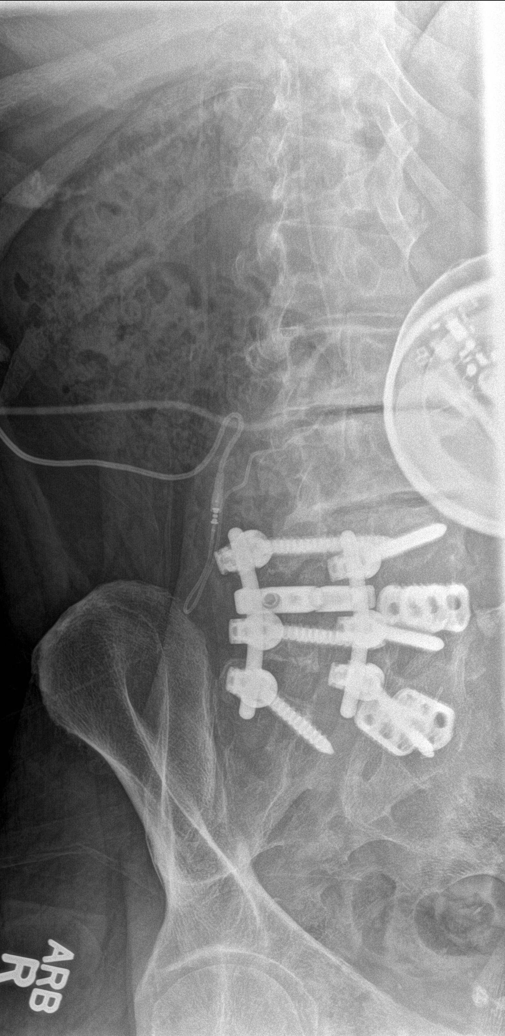
[im 4/7]
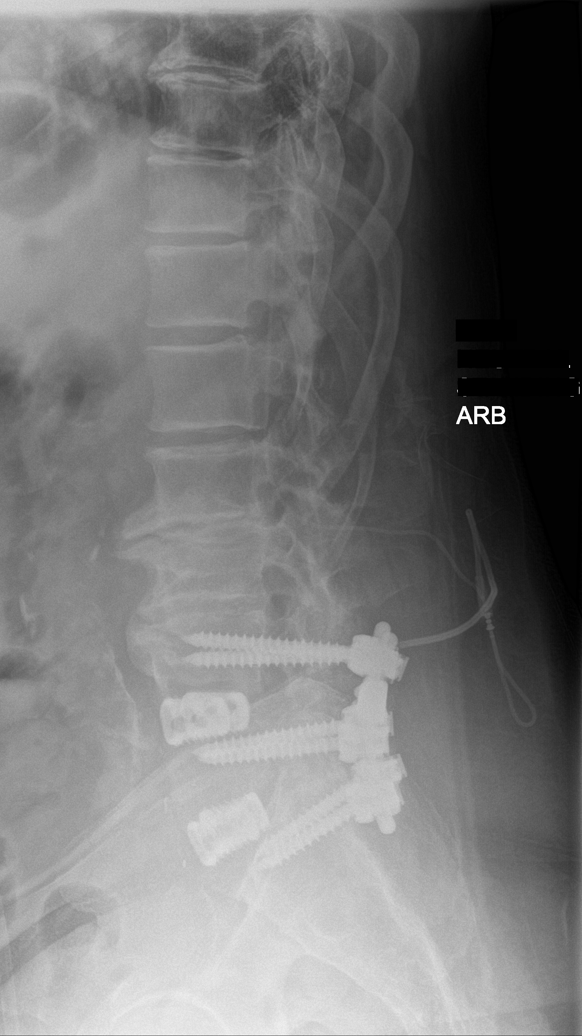
[im 5/7]
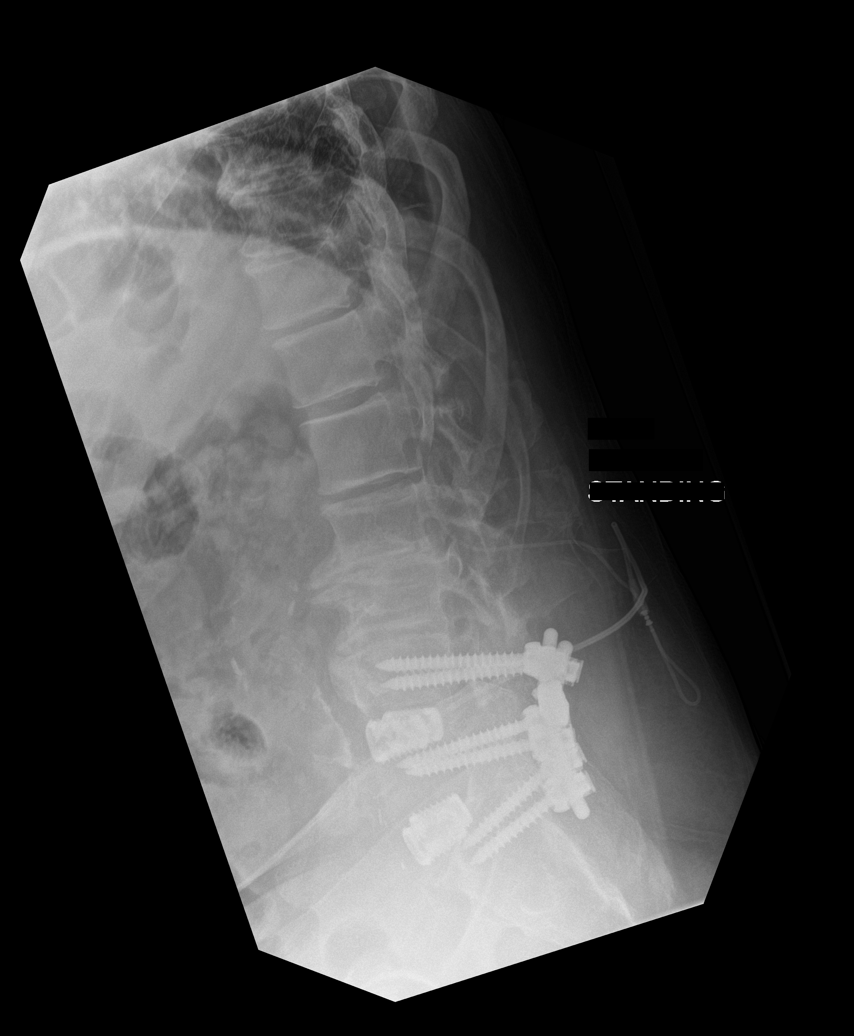
[im 6/7]
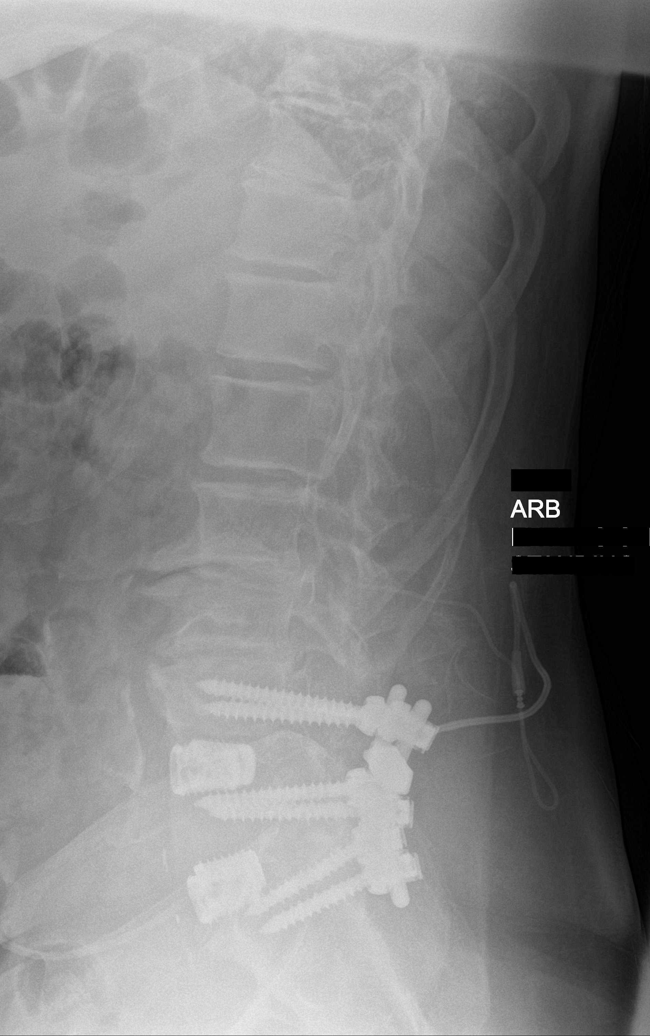
[im 7/7]
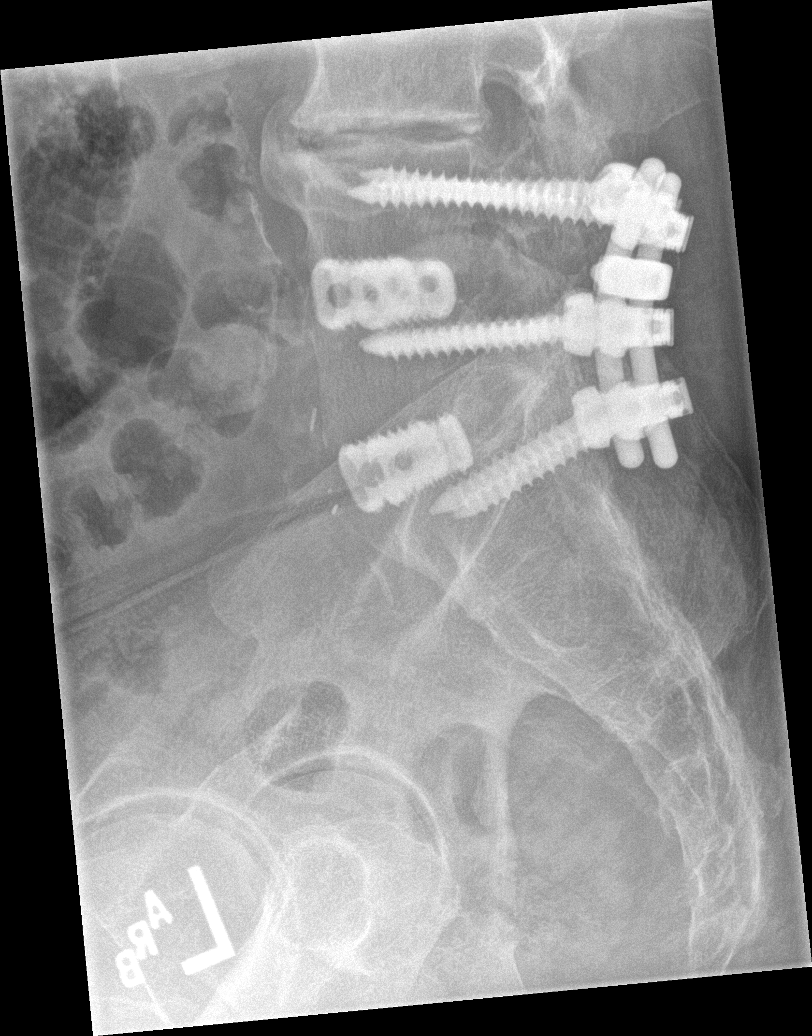

[7 of 7 positions shown; findings below may reference images not displayed]

FINDINGS: The patient has undergone posterior fusion at L4-5 and L5-S1 with
intradiscal device placement at these levels. The metallic hardware
appears intact. There is moderate disc space narrowing at L3-4 and
moderate to severe narrowing at L2-3 with mild narrowing at L1-2.
There are anterior bridging and near bridging osteophytes at L3-4
and L2-3. There is no spondylolisthesis. The pedicles of L1 through
L3 appear intact as do the transverse processes at these levels. The
observed portions of the sacrum are normal. A pain pump catheter tip
terminates at approximately the T10 level. There is calcification of
the wall of the abdominal aorta and iliac arteries.
IMPRESSION: 1. Postfusion changes at L4-5 and L5-S1 with no evidence of hardware
failure.
2. Significant disc space narrowing at L2-3 and L3-4 with prominent
anterior bridging and near bridging osteophytes at these levels.
Mild disc space narrowing at L1-2. No compression fracture.
3. Aortoiliac atherosclerosis.
4. Pain pump catheter tip terminating along the posterior aspect of
the bony spinal canal at T10.

## 2017-09-13 IMAGING — CR DG THORACIC SPINE 2V
1 series · 3 of 3 positions shown · non-contrast
Comparison: None in PACs

CLINICAL DATA: Chronic upper lower back pain treated with a pain
pump. Lumbar fusion in 8998, new patient visit with new position. No
recent injury or change in pain level.

EXAM:
THORACIC SPINE 2 VIEWS

[Series 1: dg thoracic spine 2 view · 0.14mm/px · 3 of 3 slices shown]
[im 1/3]
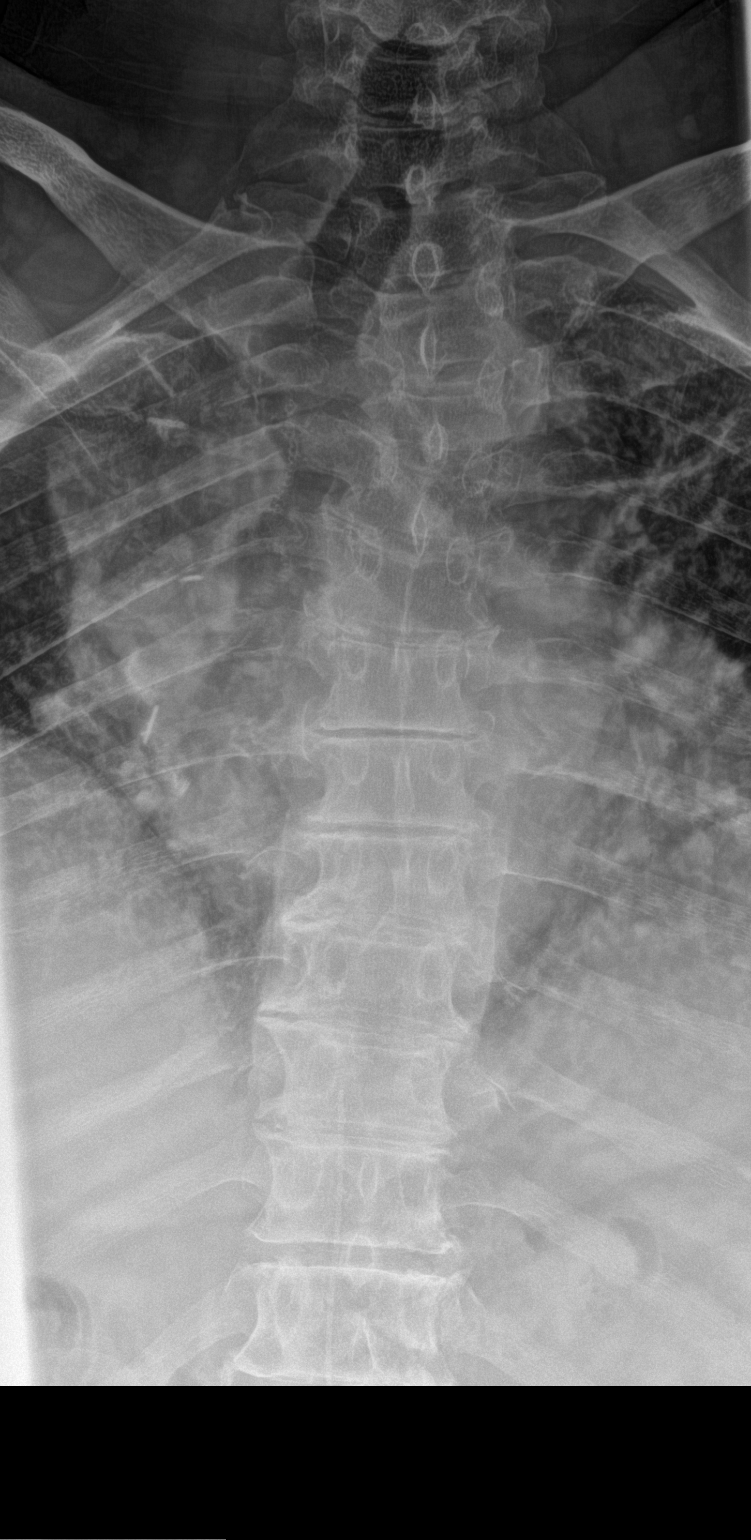
[im 2/3]
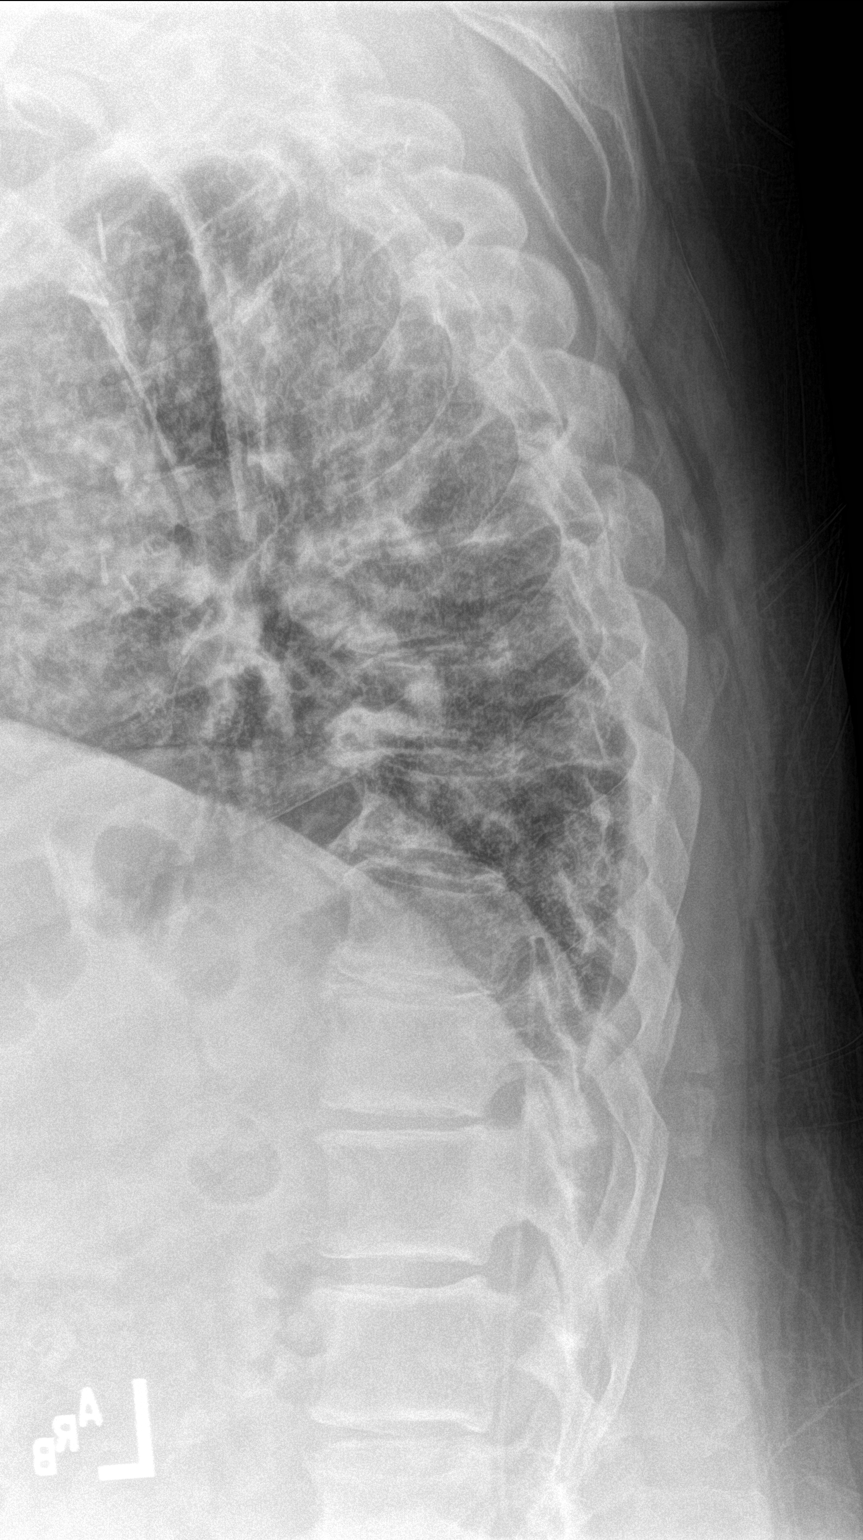
[im 3/3]
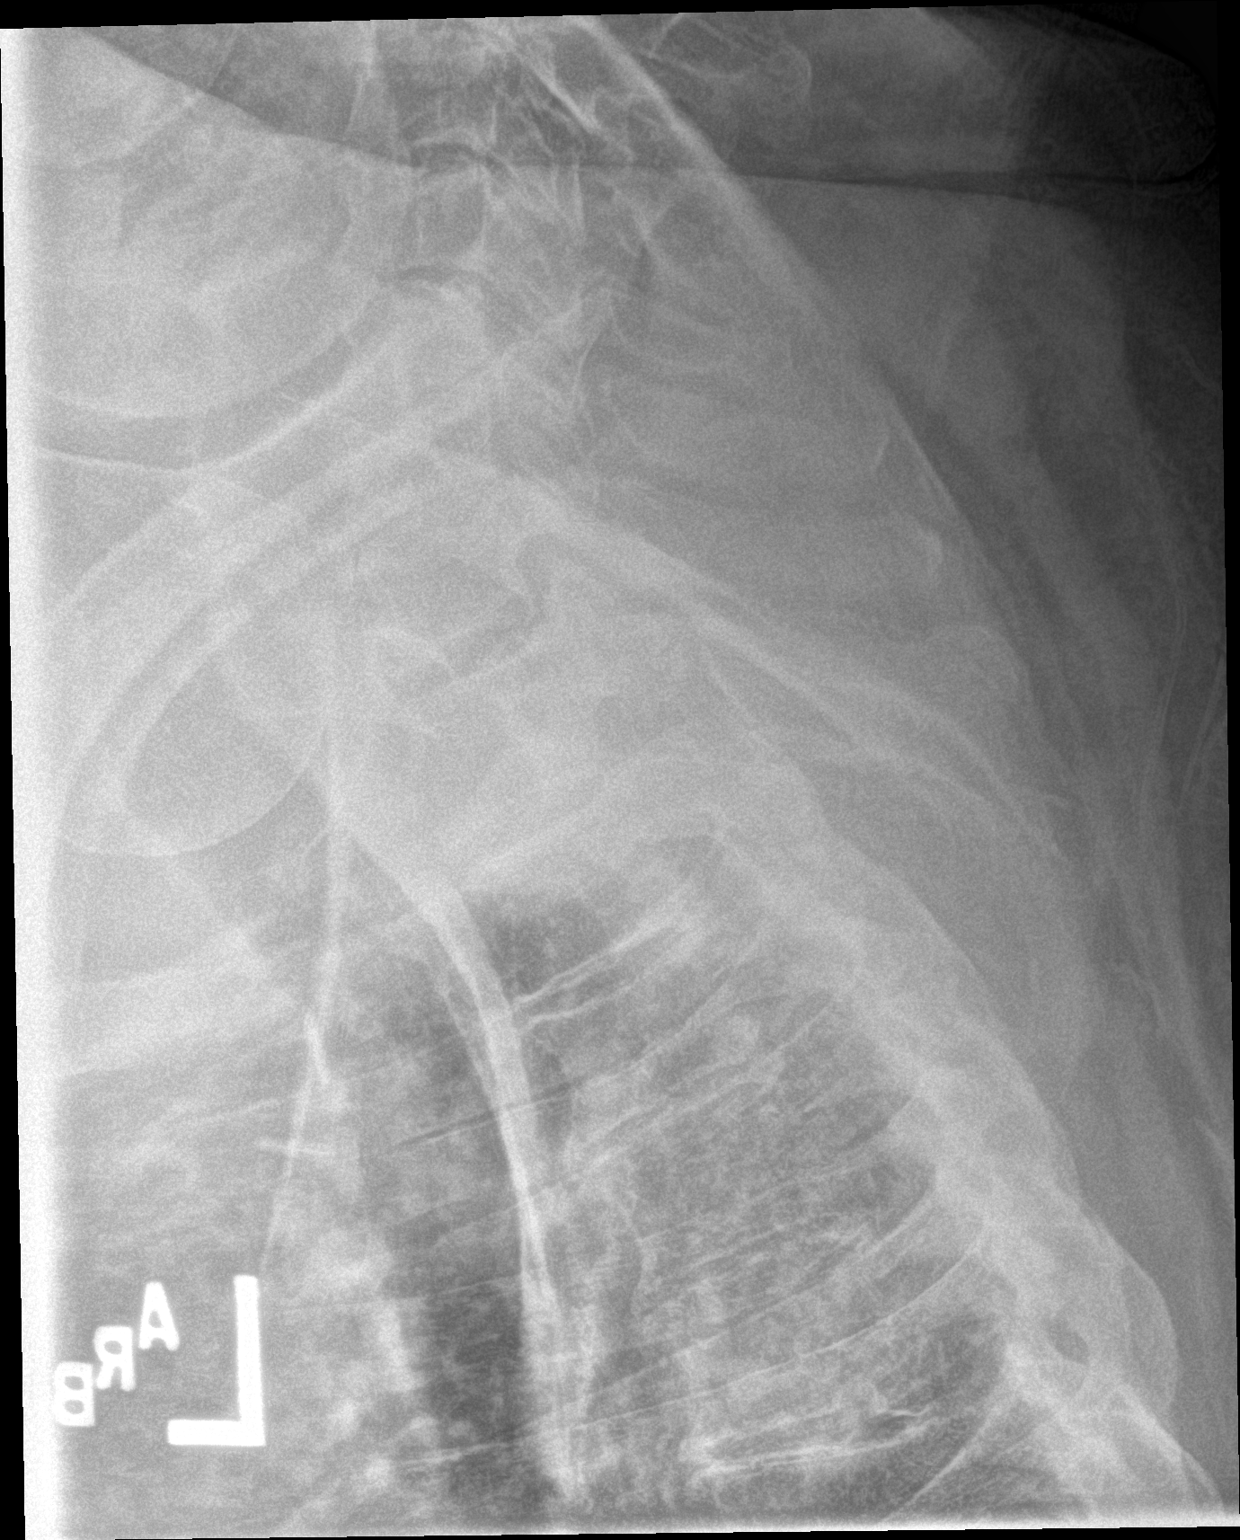

[3 of 3 positions shown; findings below may reference images not displayed]

FINDINGS: The bones are subjectively osteopenic. The thoracic vertebral bodies
are preserved in height. There is mild multilevel degenerative disc
space narrowing with endplate osteophyte formation. The There is
gentle levocurvature centered at T3. The pedicles appear intact.
There are no abnormal paravertebral soft tissue densities. pain pump
catheter tip projects over the posterior aspect of the spinal canal
at T10.
IMPRESSION: Mild multilevel degenerative disc disease of the thoracic spine. No
compression fracture. The pain pump catheter tip projects posterior
to the T10 spinal canal.

## 2017-09-30 ENCOUNTER — Ambulatory Visit: Payer: Worker's Compensation | Admitting: Nurse Practitioner

## 2017-10-01 ENCOUNTER — Ambulatory Visit: Payer: Worker's Compensation | Attending: Nurse Practitioner | Admitting: Nurse Practitioner

## 2017-10-01 ENCOUNTER — Other Ambulatory Visit: Payer: Self-pay

## 2017-10-01 ENCOUNTER — Encounter: Payer: Self-pay | Admitting: Nurse Practitioner

## 2017-10-01 VITALS — BP 132/65 | HR 56 | Temp 98.1°F | Resp 18 | Ht 65.0 in | Wt 230.0 lb

## 2017-10-01 DIAGNOSIS — M545 Low back pain: Secondary | ICD-10-CM | POA: Insufficient documentation

## 2017-10-01 DIAGNOSIS — Z96652 Presence of left artificial knee joint: Secondary | ICD-10-CM | POA: Insufficient documentation

## 2017-10-01 DIAGNOSIS — Z978 Presence of other specified devices: Secondary | ICD-10-CM

## 2017-10-01 DIAGNOSIS — Z981 Arthrodesis status: Secondary | ICD-10-CM | POA: Insufficient documentation

## 2017-10-01 DIAGNOSIS — G894 Chronic pain syndrome: Secondary | ICD-10-CM | POA: Diagnosis not present

## 2017-10-01 DIAGNOSIS — Z451 Encounter for adjustment and management of infusion pump: Secondary | ICD-10-CM

## 2017-10-01 DIAGNOSIS — M5441 Lumbago with sciatica, right side: Secondary | ICD-10-CM

## 2017-10-01 DIAGNOSIS — M79604 Pain in right leg: Secondary | ICD-10-CM | POA: Insufficient documentation

## 2017-10-01 DIAGNOSIS — R079 Chest pain, unspecified: Secondary | ICD-10-CM | POA: Diagnosis not present

## 2017-10-01 DIAGNOSIS — G8929 Other chronic pain: Secondary | ICD-10-CM

## 2017-10-01 DIAGNOSIS — Z9689 Presence of other specified functional implants: Secondary | ICD-10-CM | POA: Insufficient documentation

## 2017-10-01 NOTE — Patient Instructions (Signed)
Opioid Overdose Opioids are substances that relieve pain by binding to pain receptors in your brain and spinal cord. Opioids include illegal drugs, such as heroin, as well as prescription pain medicines.An opioid overdose happens when you take too much of an opioid substance. This can happen with any type of opioid, including:  Heroin.  Morphine.  Codeine.  Methadone.  Oxycodone.  Hydrocodone.  Fentanyl.  Hydromorphone.  Buprenorphine.  The effects of an overdose can be mild, dangerous, or even deadly. Opioid overdose is a medical emergency. What are the causes? This condition may be caused by:  Taking too much of an opioid by accident.  Taking too much of an opioid on purpose.  An error made by a health care provider who prescribes a medicine.  An error made by the pharmacist who fills the prescription order.  Using more than one substance that contains opioids at the same time.  Mixing an opioid with a substance that affects your heart, breathing, or blood pressure. These include alcohol, tranquilizers, sleeping pills, illegal drugs, and some over-the-counter medicines.  What increases the risk? This condition is more likely in:  Children. They may be attracted to colorful pills. Because of a child's small size, even a small amount of a drug can be dangerous.  Elderly people. They may be taking many different drugs. Elderly people may have difficulty reading labels or remembering when they last took their medicine.  People who take an opioid on a long-term basis.  People who use: ? Illegal drugs. ? Other substances, including alcohol, while using an opioid.  People who have: ? A history of drug or alcohol abuse. ? Certain mental health conditions.  People who take opioids that are not prescribed for them.  What are the signs or symptoms? Symptoms of this condition depend on the type of opioid and the amount that was taken. Common symptoms  include:  Sleepiness or difficulty waking from sleep.  Confusion.  Slurred speech.  Slowed breathing and a slow pulse.  Nausea and vomiting.  Abnormally small pupils.  Signs and symptoms that require emergency treatment include:  Cold, clammy, and pale skin.  Blue lips and fingernails.  Vomiting.  Gurgling sounds in the throat.  A pulse that is very slow or difficult to detect.  Breathing that is very slow, noisy, or difficult to detect.  Limp body.  Inability to respond to speech or be awakened from sleep (stupor).  How is this diagnosed? This condition is diagnosed based on your symptoms. It is important to tell your health care provider:  All of the opioidsthat you took.  When you took the opioids.  Whether you were drinking alcohol or using other substances.  Your health care provider will do a physical exam. This exam may include:  Checking and monitoring your heart rate and rhythm, your breathing rate and depth, your temperature, and your blood pressure (vital signs).  Checking for abnormally small pupils.  Measuring oxygen levels in your blood.  You may also have blood tests or urine tests. How is this treated? Supporting your vital signs and your breathing is the first step in treating an opioid overdose. Treatment may also include:  Giving fluids and minerals (electrolytes) through an IV tube.  Inserting a breathing tube (endotracheal tube) in your airway to help you breathe.  Giving oxygen.  Passing a tube through your nose and into your stomach (NG tube, or nasogastric tube) to wash out your stomach.  Giving medicines that: ? Increase your   blood pressure. ? Absorb any opioid that is in your digestive system. ? Reverse the effects of the opioid (naloxone).  Ongoing counseling and mental health support if you intentionally overdosed or used an illegal drug.  Follow these instructions at home:  Take over-the-counter and prescription  medicines only as told by your health care provider. Always ask your health care provider about possible side effects and interactions of any new medicine that you start taking.  Keep a list of all of the medicines that you take, including over-the-counter medicines. Bring this list with you to all of your medical visits.  Drink enough fluid to keep your urine clear or pale yellow.  Keep all follow-up visits as told by your health care provider. This is important. How is this prevented?  Get help if you are struggling with: ? Alcohol or drug use. ? Depression or another mental health problem.  Keep the phone number of your local poison control center near your phone or on your cell phone.  Store all medicines in safety containers that are out of the reach of children.  Read the drug inserts that come with your medicines.  Do not drink alcohol when taking opioids.  Do not use illegal drugs.  Do not take opioid medicines that are not prescribed for you. Contact a health care provider if:  Your symptoms return.  You develop new symptoms or side effects when you are taking medicines. Get help right away if:  You think that you or someone else may have taken too much of an opioid. The hotline of the National Poison Control Center is (800) 222-1222.  You or someone else is having symptoms of an opioid overdose.  You have serious thoughts about hurting yourself or others.  You have: ? Chest pain. ? Difficulty breathing. ? A loss of consciousness. Opioid overdose is an emergency. Do not wait to see if the symptoms will go away. Get medical help right away. Call your local emergency services (911 in the [place] Do not drive yourself to the hospital. This information is not intended to replace advice given to you by your health care provider. Make sure you discuss any questions you have with your health care provider. Document Released: 03/13/2004 Document Revised: 07/12/2015 Document  Reviewed: 07/20/2014 [employer] Interactive Patient Education  2018 [employer]  

## 2017-10-01 NOTE — Progress Notes (Signed)
Patient's Name: Brian Spencer  MRN: [id number]  Referring Provider: No ref. provider found  DOB: 12-16-1950  PCP: System, Pcp Not In  DOS: 10/01/2017  Note by: Dionisio David, NP  Service setting: Ambulatory outpatient  Specialty: Interventional Pain Management  Patient type: Established  Location: [facility] (AMB) Pain Management Facility  Visit type: Interventional Procedure   Primary Reason for Visit: Interventional Pain Management Treatment. CC: Back Pain (low)  Procedure:  Intrathecal Drug Delivery System (IDDS):  Type: Reservoir Refill [phone])       Region: Abdominal Laterality: Right  Type of Pump: Medtronic Synchromed II (MRI-compatible) Delivery Route: Intrathecal Type of Pain Treated: Neuropathic/Nociceptive Primary Medication Class: Opioid/opiate  Medication, Concentration, Infusion Program, & Delivery Rate: Please see scanned programming printout.  Indications: 1. Chronic low back pain (Primary Source of Pain) (Bilateral) (R>L)   2. Chronic lower extremity pain (Secondary source of pain) (Right)   3. Encounter for adjustment or management of infusion pump   4. Presence of intrathecal pump   5. Chronic pain syndrome    Pain Assessment: Self-Reported Pain Score: 5 /10             Reported level is compatible with observation.        Intrathecal Pump Therapy Assessment  Manufacturer: Medtronic Synchromed II Type: Programmable Volume: 40 mL reservoir MRI compatibility: Yes   Drug content:  Primary Medication Class: Opioid Primary Medication: PF-Fentanyl Secondary Medication: see pump readout Other Medication: see pump readout   Programming:  Type: Simple continuous. See pump readout for details.   Changes:  Medication Change: None at this point Rate Change: No change in rate  Reported side-effects or adverse reactions: None reported  Effectiveness: Described as relatively effective, allowing for increase in activities of daily living (ADL) Clinically meaningful improvement  in function (CMIF): Sustained CMIF goals met  Plan: Pump refill today  Pre-op Assessment:  Brian Spencer is a 67 y.o. (year old), male patient, seen today for interventional treatment. 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 Brian Spencer has a current medication list which includes the following prescription(s): albuterol, amlodipine, apixaban, atorvastatin, clonazepam, coenzyme K53, folic acid, furosemide, methotrexate, multivitamin, pantoprazole, propranolol, tamsulosin, tiotropium bromide-olodaterol, trazodone, venlafaxine xr, aspirin ec, PAIN MANAGEMENT IT PUMP REFILL, and verapamil. His primarily concern today is the Back Pain (low)  Initial Vital Signs:  Pulse/HCG Rate: (!) 56  Temp: 98.1 F (36.7 C) Resp: 18(oxygen at 3L prn on) BP: 132/65 SpO2: 95 %  BMI: Estimated body mass index is 38.27 kg/m as calculated from the following:   Height as of this encounter: '5\' 5"'$  (1.651 m).   Weight as of this encounter: 230 lb (104.3 kg).  Risk Assessment: Allergies: Reviewed. He is allergic to baclofen and hydralazine.  Allergy Precautions: None required Coagulopathies: Reviewed. None identified.  Blood-thinner therapy: None at this time Active  Infection(s): Reviewed. None identified. Brian Spencer is afebrile  Site Confirmation: Brian Spencer was asked to confirm  the procedure and laterality before marking the site Procedure checklist: Completed Consent: Before the procedure and under the influence of no sedative(s), amnesic(s), or anxiolytics, the patient was informed of the treatment options, risks and possible complications. To fulfill our ethical and legal obligations, as recommended by the American Medical Association's Code of Ethics, I have informed the patient of my clinical impression; the nature and purpose of the treatment or procedure; the risks, benefits, and possible complications of the intervention; the alternatives, including doing nothing; the risk(s) and benefit(s) of the alternative treatment(s) or procedure(s); and the risk(s) and benefit(s) of doing nothing.  Brian Spencer was provided with information about the general risks and possible complications associated with most interventional procedures. These include, but are not limited to: failure to achieve desired goals, infection, bleeding, organ or nerve damage, allergic reactions, paralysis, and/or death.  In addition, he was informed of those risks and possible complications associated to this particular procedure, which include, but are not limited to: damage to the implant; failure to decrease pain; local, systemic, or serious CNS infections, intraspinal abscess with possible cord compression and paralysis, or life-threatening such as meningitis; bleeding; organ damage; nerve injury or damage with subsequent sensory, motor, and/or autonomic system dysfunction, resulting in transient or permanent pain, numbness, and/or weakness of one or several areas of the body; allergic reactions, either minor or major life-threatening, such as anaphylactic or anaphylactoid reactions.  Furthermore, Brian Spencer was informed of those risks and complications associated with the medications. These  include, but are not limited to: allergic reactions (i.e.: anaphylactic or anaphylactoid reactions); endorphine suppression; bradycardia and/or hypotension; water retention and/or peripheral vascular relaxation leading to lower extremity edema and possible stasis ulcers; respiratory depression and/or shortness of breath; decreased metabolic rate leading to weight gain; swelling or edema; medication-induced neural toxicity; particulate matter embolism and blood vessel occlusion with resultant organ, and/or nervous system infarction; and/or intrathecal granuloma formation with possible spinal cord compression and permanent paralysis.  Before refilling the pump Brian Spencer was informed that some of the medications used in the devise may not be FDA approved for such use and therefore it constitutes an off-label use of the medications.  Finally, he was informed that Medicine is not an exact science; therefore, there is also the possibility of unforeseen or unpredictable risks and/or possible complications that may result in a catastrophic outcome. The patient indicated having understood very clearly. We have given the patient no guarantees and we have made no promises. Enough time was given to the patient to ask questions, all of which were answered to the patient's satisfaction. Brian Spencer has indicated that he wanted to continue with the procedure. Attestation: I, the ordering provider, attest that I have discussed with the patient the benefits, risks, side-effects, alternatives, likelihood of achieving goals, and potential problems during recovery for the procedure that I have provided informed consent. Date  Time: 10/01/2017 11:00 AM  Pre-Procedure Preparation:  Monitoring: As per clinic protocol. Respiration, ETCO2, SpO2, BP, heart rate and rhythm monitor placed and checked for adequate function Safety Precautions: Patient was assessed for positional comfort and pressure points before starting the  procedure. Time-out: I initiated and conducted the "Time-out" before starting the procedure, as per protocol. The patient was asked to participate by confirming the accuracy of the "Time Out" information. Verification of the correct person, site, and procedure were performed and confirmed by me, the nursing staff, and the patient. "Time-out" conducted as per Joint Commission's Universal Protocol (UP.01.01.01). Time: 5621  Description of Procedure Process:   Position: Supine Target Area: Central-port of intrathecal pump. Approach: Anterior, 90 degree angle approach. Area Prepped: Entire Area around the pump implant. Prepping solution: ChloraPrep (2% chlorhexidine gluconate and 70% isopropyl alcohol) Safety Precautions: Aspiration looking for blood return was conducted prior to all injections. At no point did we inject any substances, as a needle was being advanced. No attempts were made at seeking any paresthesias. Safe injection practices and needle disposal techniques used. Medications properly checked for expiration dates. SDV (single dose vial) medications used. Description of the Procedure: Protocol guidelines were followed. Two nurses trained to do implant refills were present during the entire procedure. The refill medication was checked by both healthcare providers as well as the patient. The patient was included in the "Time-out" to verify the medication. The patient was placed in position. The pump was identified. The area was prepped in the usual manner. The sterile template was positioned over the pump, making sure the side-port location matched that of the pump. Both, the pump and the template were held for stability. The needle provided in the Medtronic Kit was then introduced thru the center of the template and into the central port. The pump content was aspirated and discarded volume documented. The new medication was slowly infused into the pump, thru the filter, making sure to avoid  overpressure of the device. The needle was then removed and the area cleansed, making sure to leave some of the prepping solution back to take advantage of its long term bactericidal properties. The pump was interrogated and programmed to reflect the correct medication, volume, and dosage. The program was printed and taken to the physician for approval. Once checked and signed by the physician, a copy was provided to the patient and another scanned into the EMR. Vitals:   10/01/17 1059  BP: 132/65  Pulse: (!) 56  Resp: 18  Temp: 98.1 F (36.7 C)  SpO2: 95%  Weight: 230 lb (104.3 kg)  Height: _0  (1.651 m)    Start Time: 1144 hrs. End Time:   hrs. Materials & Medications: Medtronic Refill Kit Medication(s): Please see chart orders for details.  Imaging Guidance:          Type of Imaging Technique: None used Indication(s): N/A Exposure Time: No patient exposure Contrast: None used. Fluoroscopic Guidance: N/A Ultrasound Guidance: N/A Interpretation: N/A  Antibiotic Prophylaxis:   Anti-infectives (From admission, onward)   None     Indication(s): None identified  Post-operative Assessment:  Post-procedure Vital Signs:  Pulse/HCG Rate: (!) 56  Temp: 98.1 F (36.7 C) Resp: 18(oxygen at 3L prn on) BP: 132/65 SpO2: 95 %  EBL: None  Complications: No immediate post-treatment complications observed by team, or reported by patient.  Note: The patient tolerated the entire procedure well. A repeat set of vitals were taken after the procedure and the patient was kept under observation following institutional policy, for this type of procedure. Post-procedural neurological assessment was performed, showing return to baseline, prior to discharge. The patient was provided with post-procedure discharge instructions, including a section on how to identify potential problems. Should any problems arise concerning this procedure, the patient was given instructions to immediately contact  us, at any time, without hesitation. In any case, we plan to contact the patient by telephone for a follow-up status report regarding this interventional procedure.  Comments:  No additional relevant information.  Assessment  Primary Diagnosis & Pertinent Problem List: The primary encounter diagnosis was Chronic low back pain (  Primary Source of Pain) (Bilateral) (R>L). Diagnoses of Chronic lower extremity pain (Secondary source of pain) (Right), Encounter for adjustment or management of infusion pump, Presence of intrathecal pump, and Chronic pain syndrome were also pertinent to this visit.  Status Diagnosis  Controlled Controlled Controlled 1. Chronic low back pain (Primary Source of Pain) (Bilateral) (R>L)   2. Chronic lower extremity pain (Secondary source of pain) (Right)   3. Encounter for adjustment or management of infusion pump   4. Presence of intrathecal pump   5. Chronic pain syndrome     Problems updated and reviewed during this visit: No problems updated. Plan of Care   Imaging Orders  No imaging studies ordered today   Procedure Orders    No procedure(s) ordered today    Medications ordered for procedure: No orders of the defined types were placed in this encounter.  Medications administered: Mandell Spencer had no medications administered during this visit.  See the medical record for exact dosing, route, and time of administration.  New Prescriptions   No medications on file   Disposition: Discharge home  Discharge Date & Time: 10/01/2017; 1200 hrs.   Physician-requested Follow-up: Return in about 3 months (around 01/01/2018) for Pump refill based on programming.  Future Appointments  Date Time Provider [place]  12/31/2017 11:00 AM Vevelyn Francois, NP [facility] None   Primary Care Physician: System, Pcp Not In Location: [facility] Facility Note by: Dionisio David, NP Date: 10/01/2017; Time: 5:27 PM  Disclaimer:  Medicine is not  an exact science. The only guarantee in medicine is that nothing is guaranteed. It is important to note that the decision to proceed with this intervention was based on the information collected from the patient. The Data and conclusions were drawn from the patient's questionnaire, the interview, and the physical examination. Because the information was provided in large part by the patient, it cannot be guaranteed that it has not been purposely or unconsciously manipulated. Every effort has been made to obtain as much relevant data as possible for this evaluation. It is important to note that the conclusions that lead to this procedure are derived in large part from the available data. Always take into account that the treatment will also be dependent on availability of resources and existing treatment guidelines, considered by other Pain Management Practitioners as being common knowledge and practice, at the time of the intervention. For Medico-Legal purposes, it is also important to point out that variation in procedural techniques and pharmacological choices are the acceptable norm. The indications, contraindications, technique, and results of the above procedure should only be interpreted and judged by a Board-Certified Interventional Pain Specialist with extensive familiarity and expertise in the same exact procedure and technique.

## 2017-10-01 NOTE — Progress Notes (Signed)
Safety precautions to be maintained throughout the outpatient stay will include: orient to surroundings, keep bed in low position, maintain call bell within reach at all times, provide assistance with transfer out of bed and ambulation.  

## 2017-10-02 ENCOUNTER — Telehealth: Payer: Self-pay

## 2017-10-02 NOTE — Telephone Encounter (Signed)
Post pump refill phone call.  Patietn states he is doing fantastic.

## 2017-10-07 MED FILL — Medication: INTRATHECAL | Qty: 1 | Status: AC

## 2017-12-14 ENCOUNTER — Other Ambulatory Visit: Payer: Self-pay

## 2017-12-14 DIAGNOSIS — G894 Chronic pain syndrome: Secondary | ICD-10-CM

## 2017-12-14 DIAGNOSIS — Z978 Presence of other specified devices: Secondary | ICD-10-CM

## 2017-12-14 MED ORDER — PAIN MANAGEMENT IT PUMP REFILL: 1.0000 | Freq: Once | INTRATHECAL | 0 refills | Status: DC

## 2017-12-31 ENCOUNTER — Other Ambulatory Visit: Payer: Self-pay

## 2017-12-31 ENCOUNTER — Encounter: Payer: Self-pay | Admitting: Nurse Practitioner

## 2017-12-31 ENCOUNTER — Ambulatory Visit: Payer: Worker's Compensation | Attending: Nurse Practitioner | Admitting: Nurse Practitioner

## 2017-12-31 VITALS — BP 154/70 | HR 61 | Temp 98.0°F | Resp 16 | Ht 65.0 in | Wt 235.0 lb

## 2017-12-31 DIAGNOSIS — M961 Postlaminectomy syndrome, not elsewhere classified: Secondary | ICD-10-CM

## 2017-12-31 DIAGNOSIS — M79604 Pain in right leg: Secondary | ICD-10-CM | POA: Insufficient documentation

## 2017-12-31 DIAGNOSIS — G8929 Other chronic pain: Secondary | ICD-10-CM

## 2017-12-31 DIAGNOSIS — Z760 Encounter for issue of repeat prescription: Secondary | ICD-10-CM | POA: Insufficient documentation

## 2017-12-31 DIAGNOSIS — Z978 Presence of other specified devices: Secondary | ICD-10-CM | POA: Diagnosis not present

## 2017-12-31 DIAGNOSIS — Z79899 Other long term (current) drug therapy: Secondary | ICD-10-CM | POA: Insufficient documentation

## 2017-12-31 DIAGNOSIS — G894 Chronic pain syndrome: Secondary | ICD-10-CM

## 2017-12-31 DIAGNOSIS — M47816 Spondylosis without myelopathy or radiculopathy, lumbar region: Secondary | ICD-10-CM

## 2017-12-31 NOTE — Progress Notes (Signed)
Patient's Name: Brian Spencer  MRN: [id number]  Referring Provider: No ref. provider found  DOB: [birth year]  PCP: System, [place] Not In  DOS: 12/31/2017  Note by: Dionisio David, NP  Service setting: Ambulatory outpatient  Specialty: Interventional Pain Management  Patient type: Established  Location: [facility] (AMB) Pain Management Facility  Visit type: Interventional Procedure   Primary Reason for Visit: Interventional Pain Management Treatment. CC: Back Pain  Procedure:  Intrathecal Drug Delivery System (IDDS):  Type: Reservoir Refill [phone]) 10% rate decrease Region: Abdominal Laterality: Right  Type of Pump: Medtronic Synchromed II (MRI-compatible) Delivery Route: Intrathecal Type of Pain Treated: Neuropathic/Nociceptive Primary Medication Class: Opioid/opiate  Medication, Concentration, Infusion Program, & Delivery Rate: Please see scanned programming printout.  Indications: 1. Lumbar facet joint syndrome   2. Chronic lower extremity pain (Secondary source of pain) (Right)   3. Failed back surgical syndrome   4. Presence of intrathecal pump   5. Chronic pain syndrome    Pain Assessment: Self-Reported Pain Score: 5 /10 Clinically the patient looks like a 1/10 Reported level is compatible with observation.        Intrathecal Pump Therapy Assessment  Manufacturer: Medtronic Synchromed II Type: Programmable Volume: 40 mL reservoir MRI compatibility: Yes   Drug content:  Primary Medication Class: Opioid Primary Medication: PF-Hydromorphone (Dilaudid) Secondary Medication: PF-Clonidine Other Medication: see pump readout   Programming:  Type: Simple continuous. See pump readout for details.   Changes:  Medication Change: None at this point Rate Change: No change in rate  Reported side-effects or adverse reactions: None reported  Effectiveness: Described as relatively effective, allowing for increase in activities of daily living (ADL) Clinically meaningful improvement in function  (CMIF): Sustained CMIF goals met  Plan: Pump refill today  Pre-op Assessment:  Brian Spencer is a 67 y.o. (year old), male patient, seen today for interventional treatment. 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 Brian Spencer has a current medication list which includes the following prescription(s): albuterol, amlodipine, apixaban, atorvastatin, clonazepam, coenzyme P37, folic acid, furosemide, methotrexate, multivitamin, PAIN MANAGEMENT IT PUMP REFILL, pantoprazole, propranolol, tamsulosin, tiotropium bromide-olodaterol, trazodone, venlafaxine xr, verapamil, and aspirin ec. His primarily concern today is the Back Pain  Initial Vital Signs:  Pulse/HCG Rate: 61  Temp: 98 F (36.7 C) Resp: 16 BP: (!) 154/70 SpO2: 91 %  BMI: Estimated body mass index is 39.11 kg/m as calculated from the following:   Height as of this encounter: 5' 5"  (1.651 m).   Weight as of this encounter: 235 lb (106.6 kg).  Risk Assessment: Allergies: Reviewed. He is allergic to baclofen and hydralazine.  Allergy Precautions: None required Coagulopathies: Reviewed. None identified.  Blood-thinner therapy: None at this time Active Infection(s): Reviewed. None identified.  Brian Spencer is afebrile  Site Confirmation: Brian Spencer was asked to confirm the procedure and laterality before marking the site Procedure checklist: Completed Consent: Before the procedure and under the influence of no sedative(s), amnesic(s), or anxiolytics,  the patient was informed of the treatment options, risks and possible complications. To fulfill our ethical and legal obligations, as recommended by the American Medical Association's Code of Ethics, I have informed the patient of my clinical impression; the nature and purpose of the treatment or procedure; the risks, benefits, and possible complications of the intervention; the alternatives, including doing nothing; the risk(s) and benefit(s) of the alternative treatment(s) or procedure(s); and the risk(s) and benefit(s) of doing nothing.  Brian Spencer was provided with information about the general risks and possible complications associated with most interventional procedures. These include, but are not limited to: failure to achieve desired goals, infection, bleeding, organ or nerve damage, allergic reactions, paralysis, and/or death.  In addition, he was informed of those risks and possible complications associated to this particular procedure, which include, but are not limited to: damage to the implant; failure to decrease pain; local, systemic, or serious CNS infections, intraspinal abscess with possible cord compression and paralysis, or life-threatening such as meningitis; bleeding; organ damage; nerve injury or damage with subsequent sensory, motor, and/or autonomic system dysfunction, resulting in transient or permanent pain, numbness, and/or weakness of one or several areas of the body; allergic reactions, either minor or major life-threatening, such as anaphylactic or anaphylactoid reactions.  Furthermore, Brian Spencer was informed of those risks and complications associated with the medications. These include, but are not limited to: allergic  reactions (i.e.: anaphylactic or anaphylactoid reactions); endorphine suppression; bradycardia and/or hypotension; water retention and/or peripheral vascular relaxation leading to lower extremity edema and possible stasis ulcers; respiratory depression and/or shortness of breath; decreased metabolic rate leading to weight gain; swelling or edema; medication-induced neural toxicity; particulate matter embolism and blood vessel occlusion with resultant organ, and/or nervous system infarction; and/or intrathecal granuloma formation with possible spinal cord compression and permanent paralysis.  Before refilling the pump Mr. Calvin was informed that some of the medications used in the devise may not be FDA approved for such use and therefore it constitutes an off-label use of the medications.  Finally, he was informed that Medicine is not an exact science; therefore, there is also the possibility of unforeseen or unpredictable risks and/or possible complications that may result in a catastrophic outcome. The patient indicated having understood very clearly. We have given the patient no guarantees and we have made no promises. Enough time was given to the patient to ask questions, all of which were answered to the patient's satisfaction. Mr. Gaertner has indicated that he wanted to continue with the procedure. Attestation: I, the ordering provider, attest that I have discussed with the patient the benefits, risks, side-effects, alternatives, likelihood of achieving goals, and potential problems during recovery for the procedure that I have provided informed consent. Date  Time: 12/31/2017 11:00 AM  Pre-Procedure Preparation:  Monitoring: As per clinic protocol. Respiration, ETCO2, SpO2, BP, heart rate and rhythm monitor placed and checked for adequate function Safety Precautions: Patient was assessed for positional comfort and pressure points before starting the procedure. Time-out: I initiated and conducted the  "Time-out" before starting the procedure, as per protocol. The patient was asked to participate by confirming the accuracy of the "Time Out" information. Verification of the correct person, site, and procedure were performed and confirmed by me, the nursing staff, and the patient. "Time-out" conducted as per Joint Commission's Universal Protocol (UP.01.01.01). Time: 1140  Description of Procedure Process:   Position: Supine Target Area: Central-port of intrathecal pump. Approach: Anterior, 90 degree angle approach. Area Prepped: Entire Area around  the pump implant. Prepping solution: ChloraPrep (2% chlorhexidine gluconate and 70% isopropyl alcohol) Safety Precautions: Aspiration looking for blood return was conducted prior to all injections. At no point did we inject any substances, as a needle was being advanced. No attempts were made at seeking any paresthesias. Safe injection practices and needle disposal techniques used. Medications properly checked for expiration dates. SDV (single dose vial) medications used. Description of the Procedure: Protocol guidelines were followed. Two nurses trained to do implant refills were present during the entire procedure. The refill medication was checked by both healthcare providers as well as the patient. The patient was included in the "Time-out" to verify the medication. The patient was placed in position. The pump was identified. The area was prepped in the usual manner. The sterile template was positioned over the pump, making sure the side-port location matched that of the pump. Both, the pump and the template were held for stability. The needle provided in the Medtronic Kit was then introduced thru the center of the template and into the central port. The pump content was aspirated and discarded volume documented. The new medication was slowly infused into the pump, thru the filter, making sure to avoid overpressure of the device. The needle was then removed  and the area cleansed, making sure to leave some of the prepping solution back to take advantage of its long term bactericidal properties. The pump was interrogated and programmed to reflect the correct medication, volume, and dosage. The program was printed and taken to the physician for approval. Once checked and signed by the physician, a copy was provided to the patient and another scanned into the EMR. Vitals:   12/31/17 1059  BP: (!) 154/70  Pulse: 61  Resp: 16  Temp: 98 F (36.7 C)  SpO2: 91%  Weight: 235 lb (106.6 kg)  Height: 5' 5"  (1.651 m)    Start Time: 1141 hrs. End Time: 1150 hrs. Materials & Medications: Medtronic Refill Kit Medication(s): Please see chart orders for details.  Imaging Guidance:          Type of Imaging Technique: None used Indication(s): N/A Exposure Time: No patient exposure Contrast: None used. Fluoroscopic Guidance: N/A Ultrasound Guidance: N/A Interpretation: N/A  Antibiotic Prophylaxis:   Anti-infectives (From admission, onward)   None     Indication(s): None identified  Post-operative Assessment:  Post-procedure Vital Signs:  Pulse/HCG Rate: 61  Temp: 98 F (36.7 C) Resp: 16 BP: (!) 154/70 SpO2: 91 %  EBL: None  Complications: No immediate post-treatment complications observed by team, or reported by patient.  Note: The patient tolerated the entire procedure well. A repeat set of vitals were taken after the procedure and the patient was kept under observation following institutional policy, for this type of procedure. Post-procedural neurological assessment was performed, showing return to baseline, prior to discharge. The patient was provided with post-procedure discharge instructions, including a section on how to identify potential problems. Should any problems arise concerning this procedure, the patient was given instructions to immediately contact us, at any time, without hesitation. In any case, we plan to contact the patient  by telephone for a follow-up status report regarding this interventional procedure.  Comments:  No additional relevant information.  Assessment  Primary Diagnosis & Pertinent Problem List: The primary encounter diagnosis was Lumbar facet joint syndrome. Diagnoses of Chronic lower extremity pain (Secondary source of pain) (Right), Failed back surgical syndrome, Presence of intrathecal pump, and Chronic pain syndrome were also pertinent to this visit.  Status Diagnosis  Controlled Controlled Controlled 1. Lumbar facet joint syndrome   2. Chronic lower extremity pain (Secondary source of pain) (Right)   3. Failed back surgical syndrome   4. Presence of intrathecal pump   5. Chronic pain syndrome     Problems updated and reviewed during this visit: No problems updated. Plan of Care   Imaging Orders  No imaging studies ordered today   Procedure Orders    No procedure(s) ordered today    Medications ordered for procedure: No orders of the defined types were placed in this encounter.  Medications administered: Aysen Shieh had no medications administered during this visit.  See the medical record for exact dosing, route, and time of administration.  Disposition: Discharge home  Discharge Date & Time: 12/31/2017; 1156 hrs.   Physician-requested Follow-up: No follow-ups on file.  Future Appointments  Date Time Provider Mack  04/01/2018 11:15 AM Milinda Pointer, MD [facility] None   Primary Care Physician: System, Pcp Not In Location: [facility] Out[facility] Facility Note by: Dionisio David, NP Date: 12/31/2017; Time: 1:51 PM  Disclaimer:  Medicine is not an exact science. The only guarantee in medicine is that nothing is guaranteed. It is important to note that the decision to proceed with this intervention was based on the information collected from the patient. The Data and conclusions were drawn from the patient's questionnaire, the interview, and the  physical examination. Because the information was provided in large part by the patient, it cannot be guaranteed that it has not been purposely or unconsciously manipulated. Every effort has been made to obtain as much relevant data as possible for this evaluation. It is important to note that the conclusions that lead to this procedure are derived in large part from the available data. Always take into account that the treatment will also be dependent on availability of resources and existing treatment guidelines, considered by other Pain Management Practitioners as being common knowledge and practice, at the time of the intervention. For Medico-Legal purposes, it is also important to point out that variation in procedural techniques and pharmacological choices are the acceptable norm. The indications, contraindications, technique, and results of the above procedure should only be interpreted and judged by a Board-Certified Interventional Pain Specialist with extensive familiarity and expertise in the same exact procedure and technique.

## 2017-12-31 NOTE — Patient Instructions (Signed)
Opioid Overdose Opioids are substances that relieve pain by binding to pain receptors in your brain and spinal cord. Opioids include illegal drugs, such as heroin, as well as prescription pain medicines.An opioid overdose happens when you take too much of an opioid substance. This can happen with any type of opioid, including:  Heroin.  Morphine.  Codeine.  Methadone.  Oxycodone.  Hydrocodone.  Fentanyl.  Hydromorphone.  Buprenorphine.  The effects of an overdose can be mild, dangerous, or even deadly. Opioid overdose is a medical emergency. What are the causes? This condition may be caused by:  Taking too much of an opioid by accident.  Taking too much of an opioid on purpose.  An error made by a health care provider who prescribes a medicine.  An error made by the pharmacist who fills the prescription order.  Using more than one substance that contains opioids at the same time.  Mixing an opioid with a substance that affects your heart, breathing, or blood pressure. These include alcohol, tranquilizers, sleeping pills, illegal drugs, and some over-the-counter medicines.  What increases the risk? This condition is more likely in:  Children. They may be attracted to colorful pills. Because of a child's small size, even a small amount of a drug can be dangerous.  Elderly people. They may be taking many different drugs. Elderly people may have difficulty reading labels or remembering when they last took their medicine.  People who take an opioid on a long-term basis.  People who use: ? Illegal drugs. ? Other substances, including alcohol, while using an opioid.  People who have: ? A history of drug or alcohol abuse. ? Certain mental health conditions.  People who take opioids that are not prescribed for them.  What are the signs or symptoms? Symptoms of this condition depend on the type of opioid and the amount that was taken. Common symptoms  include:  Sleepiness or difficulty waking from sleep.  Confusion.  Slurred speech.  Slowed breathing and a slow pulse.  Nausea and vomiting.  Abnormally small pupils.  Signs and symptoms that require emergency treatment include:  Cold, clammy, and pale skin.  Blue lips and fingernails.  Vomiting.  Gurgling sounds in the throat.  A pulse that is very slow or difficult to detect.  Breathing that is very slow, noisy, or difficult to detect.  Limp body.  Inability to respond to speech or be awakened from sleep (stupor).  How is this diagnosed? This condition is diagnosed based on your symptoms. It is important to tell your health care provider:  All of the opioidsthat you took.  When you took the opioids.  Whether you were drinking alcohol or using other substances.  Your health care provider will do a physical exam. This exam may include:  Checking and monitoring your heart rate and rhythm, your breathing rate and depth, your temperature, and your blood pressure (vital signs).  Checking for abnormally small pupils.  Measuring oxygen levels in your blood.  You may also have blood tests or urine tests. How is this treated? Supporting your vital signs and your breathing is the first step in treating an opioid overdose. Treatment may also include:  Giving fluids and minerals (electrolytes) through an IV tube.  Inserting a breathing tube (endotracheal tube) in your airway to help you breathe.  Giving oxygen.  Passing a tube through your nose and into your stomach (NG tube, or nasogastric tube) to wash out your stomach.  Giving medicines that: ? Increase your   blood pressure. ? Absorb any opioid that is in your digestive system. ? Reverse the effects of the opioid (naloxone).  Ongoing counseling and mental health support if you intentionally overdosed or used an illegal drug.  Follow these instructions at home:  Take over-the-counter and prescription  medicines only as told by your health care provider. Always ask your health care provider about possible side effects and interactions of any new medicine that you start taking.  Keep a list of all of the medicines that you take, including over-the-counter medicines. Bring this list with you to all of your medical visits.  Drink enough fluid to keep your urine clear or pale yellow.  Keep all follow-up visits as told by your health care provider. This is important. How is this prevented?  Get help if you are struggling with: ? Alcohol or drug use. ? Depression or another mental health problem.  Keep the phone number of your local poison control center near your phone or on your cell phone.  Store all medicines in safety containers that are out of the reach of children.  Read the drug inserts that come with your medicines.  Do not drink alcohol when taking opioids.  Do not use illegal drugs.  Do not take opioid medicines that are not prescribed for you. Contact a health care provider if:  Your symptoms return.  You develop new symptoms or side effects when you are taking medicines. Get help right away if:  You think that you or someone else may have taken too much of an opioid. The hotline of the National Poison Control Center is (800) 222-1222.  You or someone else is having symptoms of an opioid overdose.  You have serious thoughts about hurting yourself or others.  You have: ? Chest pain. ? Difficulty breathing. ? A loss of consciousness. Opioid overdose is an emergency. Do not wait to see if the symptoms will go away. Get medical help right away. Call your local emergency services (911 in the [place] Do not drive yourself to the hospital. This information is not intended to replace advice given to you by your health care provider. Make sure you discuss any questions you have with your health care provider. Document Released: 03/13/2004 Document Revised: 07/12/2015 Document  Reviewed: 07/20/2014 [employer] Interactive Patient Education  2018 [employer]  

## 2017-12-31 NOTE — Progress Notes (Signed)
Safety precautions to be maintained throughout the outpatient stay will include: orient to surroundings, keep bed in low position, maintain call bell within reach at all times, provide assistance with transfer out of bed and ambulation.  

## 2018-01-01 ENCOUNTER — Telehealth: Payer: Self-pay

## 2018-01-01 NOTE — Telephone Encounter (Signed)
Post procedure phone call. Patient states he is doing well.  

## 2018-01-11 MED FILL — Medication: INTRATHECAL | Qty: 1 | Status: AC

## 2018-03-15 ENCOUNTER — Other Ambulatory Visit: Payer: Self-pay

## 2018-03-15 DIAGNOSIS — Z978 Presence of other specified devices: Secondary | ICD-10-CM

## 2018-03-15 DIAGNOSIS — G894 Chronic pain syndrome: Secondary | ICD-10-CM

## 2018-03-15 MED ORDER — PAIN MANAGEMENT IT PUMP REFILL: 1.0000 | Freq: Once | INTRATHECAL | 0 refills | Status: DC

## 2018-04-01 ENCOUNTER — Ambulatory Visit: Payer: Worker's Compensation | Attending: Pain Medicine | Admitting: Pain Medicine

## 2018-04-01 ENCOUNTER — Other Ambulatory Visit: Payer: Self-pay

## 2018-04-01 VITALS — BP 152/64 | HR 55 | Temp 98.4°F | Resp 16 | Ht 65.0 in | Wt 235.0 lb

## 2018-04-01 DIAGNOSIS — M5416 Radiculopathy, lumbar region: Secondary | ICD-10-CM | POA: Diagnosis present

## 2018-04-01 DIAGNOSIS — Z451 Encounter for adjustment and management of infusion pump: Secondary | ICD-10-CM | POA: Diagnosis present

## 2018-04-01 DIAGNOSIS — M5441 Lumbago with sciatica, right side: Secondary | ICD-10-CM | POA: Insufficient documentation

## 2018-04-01 DIAGNOSIS — G894 Chronic pain syndrome: Secondary | ICD-10-CM

## 2018-04-01 DIAGNOSIS — M79604 Pain in right leg: Secondary | ICD-10-CM | POA: Diagnosis present

## 2018-04-01 DIAGNOSIS — Z978 Presence of other specified devices: Secondary | ICD-10-CM | POA: Insufficient documentation

## 2018-04-01 DIAGNOSIS — M961 Postlaminectomy syndrome, not elsewhere classified: Secondary | ICD-10-CM | POA: Insufficient documentation

## 2018-04-01 DIAGNOSIS — G8929 Other chronic pain: Secondary | ICD-10-CM | POA: Insufficient documentation

## 2018-04-01 NOTE — Progress Notes (Signed)
Patient's Name: Brian Spencer  MRN: [id number]  Referring Provider: No ref. provider found  DOB: 05-07-1950  PCP: System, Pcp Not In  DOS: 04/01/2018  Note by: Gaspar Cola, MD  Service setting: Ambulatory outpatient  Specialty: Interventional Pain Management  Patient type: Established  Location: [facility] (AMB) Pain Management Facility  Visit type: Interventional Procedure   Primary Reason for Visit: Interventional Pain Management Treatment. CC: Back Pain (lower)  Procedure:          Intrathecal Drug Delivery System (IDDS):  Type: Reservoir Refill [phone]) No rate change Region: Abdominal Laterality: Right  Type of Pump: Medtronic Synchromed II Delivery Route: Intrathecal Type of Pain Treated: Neuropathic/Nociceptive Primary Medication Class: Opioid/opiate  Medication, Concentration, Infusion Program, & Delivery Rate: Please see scanned programming printout.   Indications: 1. Chronic pain syndrome   2. Failed back surgical syndrome   3. Chronic low back pain (Primary Source of Pain) (Bilateral) (R>L)   4. Chronic lower extremity pain (Secondary source of pain) (Right)   5. 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 Presence of intrathecal pump   7. Encounter for adjustment or management of infusion pump    Pain Assessment: Self-Reported Pain Score: 5 /10             Reported level is compatible with observation.        Intrathecal Pump Therapy Assessment  Manufacturer: Medtronic Synchromed Type: Programmable Volume: 40 mL reservoir MRI compatibility: Yes   Drug content:  Primary Medication Class: Opioid Primary Medication: PF-Hydromorphone (Dilaudid) (25 mg/mL)  Secondary Medication: PF-Clonidine (400 mcg/mL)  Other Medication: None   Programming:  Type: Simple continuous. See pump readout for details.   Changes:  Medication Change: None at this point Rate Change: No change in rate  Reported side-effects or adverse reactions: None  reported  Effectiveness: Described as relatively effective, allowing for increase in activities of daily living (ADL) Clinically meaningful improvement in function (CMIF): Sustained CMIF goals met  Plan: Pump refill today  Pre-op Assessment:  Mr. Cowsert is a 68 y.o. (year old), male patient, seen today for interventional treatment. 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 Mr. Castiglia has a current medication list which includes the following prescription(s): albuterol, amlodipine, apixaban, atorvastatin, buspirone, coenzyme X91, folic acid, furosemide, methotrexate, multivitamin, pantoprazole, propranolol, tamsulosin, tiotropium bromide-olodaterol, trazodone, venlafaxine xr, aspirin ec, clonazepam, PAIN MANAGEMENT IT PUMP REFILL, phenazopyridine, and verapamil. His primarily concern today is the Back Pain (lower)  Initial Vital Signs:  Pulse/HCG Rate: (!) 55  Temp: 98.4 F (36.9 C) Resp: 16 BP: (!) 152/64 SpO2: 94 %  BMI: Estimated body mass index is 39.11 kg/m as calculated from the following:   Height as of this encounter: 5' 5"  (1.651 m).   Weight as of this encounter: 235 lb (106.6 kg).  Risk Assessment: Allergies: Reviewed. He is  allergic to baclofen and hydralazine.  Allergy Precautions: None required Coagulopathies: Reviewed. None identified.  Blood-thinner therapy: None at this time Active Infection(s): Reviewed. None identified. Mr. Withrow is afebrile  Site Confirmation: Mr. Mole was asked to confirm the procedure and laterality before marking the site Procedure checklist: Completed Consent: Before the procedure and under the influence of no sedative(s), amnesic(s), or anxiolytics, the patient was informed of the treatment options, risks and possible complications. To fulfill our ethical and legal obligations, as recommended by the American Medical Association's Code of Ethics, I have informed the patient of my clinical impression; the nature and purpose of the treatment or procedure; the risks, benefits, and possible complications of the intervention; the alternatives, including doing nothing; the risk(s) and benefit(s) of the alternative treatment(s) or procedure(s); and the risk(s) and benefit(s) of doing nothing.  Mr. Osowski was provided with information about the general risks and possible complications associated with most interventional procedures. These include, but are not limited to: failure to achieve desired goals, infection, bleeding, organ or nerve damage, allergic reactions, paralysis, and/or death.  In addition, he was informed of those risks and possible complications associated to this particular procedure, which include, but are not limited to: damage to the implant; failure to decrease pain; local, systemic, or serious CNS infections, intraspinal abscess with possible cord compression and paralysis, or life-threatening such as meningitis; bleeding; organ damage; nerve injury or damage with subsequent sensory, motor, and/or autonomic system dysfunction, resulting in transient or permanent pain, numbness, and/or weakness of one or several areas of the body; allergic reactions, either minor or major  life-threatening, such as anaphylactic or anaphylactoid reactions.  Furthermore, Mr. Carn was informed of those risks and complications associated with the medications. These include, but are not limited to: allergic reactions (i.e.: anaphylactic or anaphylactoid reactions); endorphine suppression; bradycardia and/or hypotension; water retention and/or peripheral vascular relaxation leading to lower extremity edema and possible stasis ulcers; respiratory depression and/or shortness of breath; decreased metabolic rate leading to weight gain; swelling or edema; medication-induced neural toxicity; particulate matter embolism and blood vessel occlusion with resultant organ, and/or nervous system infarction; and/or intrathecal granuloma formation with possible spinal cord compression and permanent paralysis.  Before refilling the pump Mr. Mooney was informed that some of the medications used in the devise may not be FDA approved for such use and therefore it constitutes an off-label use of the medications.  Finally, he was informed that Medicine is not an exact science; therefore, there is also the possibility of unforeseen or unpredictable risks and/or possible complications that may result in a catastrophic outcome. The patient indicated having understood very clearly. We have given the patient no guarantees and we have made no promises. Enough time was given to the patient to ask questions, all of which were answered to the patient's satisfaction. Mr. Shean has indicated that he wanted to continue with the procedure. Attestation: I, the ordering provider, attest that I have discussed with the patient the benefits, risks, side-effects, alternatives, likelihood of achieving goals, and potential problems during recovery for the procedure that I have provided informed consent. Date  Time: 04/01/2018 10:34 AM  Pre-Procedure Preparation:  Monitoring: As per clinic protocol. Respiration, ETCO2, SpO2, BP, heart  rate and rhythm monitor placed and checked for adequate function Safety Precautions: Patient was assessed for positional comfort and pressure points before starting the procedure. Time-out: I initiated and conducted the "Time-out" before starting the procedure, as per protocol. The patient was asked to participate by confirming the accuracy of the "Time Out" information. Verification of the correct person, site, and procedure  were performed and confirmed by me, the nursing staff, and the patient. "Time-out" conducted as per Joint Commission's Universal Protocol (UP.01.01.01). Time: (P) 1055  Description of Procedure:          Position: Supine Target Area: Central-port of intrathecal pump. Approach: Anterior, 90 degree angle approach. Area Prepped: Entire Area around the pump implant. Prepping solution: ChloraPrep (2% chlorhexidine gluconate and 70% isopropyl alcohol) Safety Precautions: Aspiration looking for blood return was conducted prior to all injections. At no point did we inject any substances, as a needle was being advanced. No attempts were made at seeking any paresthesias. Safe injection practices and needle disposal techniques used. Medications properly checked for expiration dates. SDV (single dose vial) medications used. Description of the Procedure: Protocol guidelines were followed. Two nurses trained to do implant refills were present during the entire procedure. The refill medication was checked by both healthcare providers as well as the patient. The patient was included in the "Time-out" to verify the medication. The patient was placed in position. The pump was identified. The area was prepped in the usual manner. The sterile template was positioned over the pump, making sure the side-port location matched that of the pump. Both, the pump and the template were held for stability. The needle provided in the Medtronic Kit was then introduced thru the center of the template and into the  central port. The pump content was aspirated and discarded volume documented. The new medication was slowly infused into the pump, thru the filter, making sure to avoid overpressure of the device. The needle was then removed and the area cleansed, making sure to leave some of the prepping solution back to take advantage of its long term bactericidal properties. The pump was interrogated and programmed to reflect the correct medication, volume, and dosage. The program was printed and taken to the physician for approval. Once checked and signed by the physician, a copy was provided to the patient and another scanned into the EMR. Vitals:   04/01/18 1033  BP: (!) 152/64  Pulse: (!) 55  Resp: 16  Temp: 98.4 F (36.9 C)  TempSrc: Oral  SpO2: 94%  Weight: 235 lb (106.6 kg)  Height: 5' 5"  (1.651 m)    Start Time:   hrs. End Time:   hrs. Materials & Medications: Medtronic Refill Kit Medication(s): Please see chart orders for details.  Imaging Guidance:          Type of Imaging Technique: None used Indication(s): N/A Exposure Time: No patient exposure Contrast: None used. Fluoroscopic Guidance: N/A Ultrasound Guidance: N/A Interpretation: N/A  Antibiotic Prophylaxis:   Anti-infectives (From admission, onward)   None     Indication(s): None identified  Post-operative Assessment:  Post-procedure Vital Signs:  Pulse/HCG Rate: (!) 55  Temp: 98.4 F (36.9 C) Resp: 16 BP: (!) 152/64 SpO2: 94 %  EBL: None  Complications: No immediate post-treatment complications observed by team, or reported by patient.  Note: The patient tolerated the entire procedure well. A repeat set of vitals were taken after the procedure and the patient was kept under observation following institutional policy, for this type of procedure. Post-procedural neurological assessment was performed, showing return to baseline, prior to discharge. The patient was provided with post-procedure discharge instructions,  including a section on how to identify potential problems. Should any problems arise concerning this procedure, the patient was given instructions to immediately contact us, at any time, without hesitation. In any case, we plan to contact the patient by telephone for  a follow-up status report regarding this interventional procedure.  Comments:  No additional relevant information.  Plan of Care   Imaging Orders  No imaging studies ordered today    Procedure Orders     PUMP REFILL     PUMP REFILL  Medications ordered for procedure: No orders of the defined types were placed in this encounter.  Medications administered: Rapheal Masso had no medications administered during this visit.  See the medical record for exact dosing, route, and time of administration.  Disposition: Discharge home  Discharge Date & Time: 04/01/2018; 1130 hrs.   Physician-requested Follow-up: Return for Pump Refill (as per pump program) (Max:31mo.  Future Appointments  Date Time Provider DDurbin 07/22/2018 11:15 AM [facility]ilinda Pointer [facility]   Primary Care Physician: System, Pcp Not In [facility] Note by: FGaspar Cola MD Date: 04/01/2018; Time: 12:47 PM  Disclaimer:  Medicine is not an eChief Strategy Officer The only guarantee in medicine is that nothing is guaranteed. It is important to note that the decision to proceed with this intervention was based on the information collected from the patient. The Data and conclusions were drawn from the patient's questionnaire, the interview, and the physical examination. Because the information was provided in large part by the patient, it cannot be guaranteed that it has not been purposely or unconsciously manipulated. Every effort has been made to obtain as much relevant data as possible for this evaluation. It is important to note that the conclusions that lead to this procedure are derived in large part from the  available data. Always take into account that the treatment will also be dependent on availability of resources and existing treatment guidelines, considered by other Pain Management Practitioners as being common knowledge and practice, at the time of the intervention. For Medico-Legal purposes, it is also important to point out that variation in procedural techniques and pharmacological choices are the acceptable norm. The indications, contraindications, technique, and results of the above procedure should only be interpreted and judged by a Board-Certified Interventional Pain Specialist with extensive familiarity and expertise in the same exact procedure and technique.

## 2018-04-01 NOTE — Progress Notes (Signed)
Safety precautions to be maintained throughout the outpatient stay will include: orient to surroundings, keep bed in low position, maintain call bell within reach at all times, provide assistance with transfer out of bed and ambulation.  

## 2018-04-02 ENCOUNTER — Telehealth: Payer: Self-pay

## 2018-04-06 MED FILL — Medication: INTRATHECAL | Qty: 1 | Status: AC

## 2018-07-07 ENCOUNTER — Other Ambulatory Visit: Payer: Self-pay

## 2018-07-07 MED ORDER — PAIN MANAGEMENT IT PUMP REFILL: 1.0000 | Freq: Once | INTRATHECAL | 0 refills | Status: DC

## 2018-07-19 ENCOUNTER — Telehealth: Payer: Self-pay | Admitting: Pain Medicine

## 2018-07-19 ENCOUNTER — Other Ambulatory Visit: Payer: Self-pay

## 2018-07-19 DIAGNOSIS — Z01818 Encounter for other preprocedural examination: Secondary | ICD-10-CM | POA: Insufficient documentation

## 2018-07-19 NOTE — Telephone Encounter (Signed)
Attempted to call patient to notify him that he needs to get a Covid 19 test before coming in for his pump refill on Thursday.  Left message for him to call office.

## 2018-07-22 ENCOUNTER — Encounter: Payer: Self-pay | Admitting: Pain Medicine

## 2018-07-22 ENCOUNTER — Other Ambulatory Visit: Payer: Self-pay

## 2018-07-22 ENCOUNTER — Encounter: Payer: Worker's Compensation | Attending: Pain Medicine | Admitting: Pain Medicine

## 2018-07-22 ENCOUNTER — Encounter
Admission: RE | Admit: 2018-07-22 | Discharge: 2018-07-22 | Disposition: A | Discharge status: Home / Self Care | Payer: Worker's Compensation | Source: Ambulatory Visit | Attending: Pain Medicine | Admitting: Pain Medicine

## 2018-07-22 VITALS — BP 155/66 | HR 53 | Temp 98.1°F | Resp 18 | Ht 64.0 in | Wt 230.0 lb

## 2018-07-22 DIAGNOSIS — M79604 Pain in right leg: Secondary | ICD-10-CM | POA: Insufficient documentation

## 2018-07-22 DIAGNOSIS — G894 Chronic pain syndrome: Secondary | ICD-10-CM | POA: Diagnosis not present

## 2018-07-22 DIAGNOSIS — Z451 Encounter for adjustment and management of infusion pump: Secondary | ICD-10-CM

## 2018-07-22 DIAGNOSIS — Z96652 Presence of left artificial knee joint: Secondary | ICD-10-CM | POA: Insufficient documentation

## 2018-07-22 DIAGNOSIS — Z888 Allergy status to other drugs, medicaments and biological substances status: Secondary | ICD-10-CM | POA: Diagnosis not present

## 2018-07-22 DIAGNOSIS — Z1159 Encounter for screening for other viral diseases: Secondary | ICD-10-CM | POA: Insufficient documentation

## 2018-07-22 DIAGNOSIS — Z978 Presence of other specified devices: Secondary | ICD-10-CM

## 2018-07-22 DIAGNOSIS — Z01818 Encounter for other preprocedural examination: Secondary | ICD-10-CM

## 2018-07-22 DIAGNOSIS — M961 Postlaminectomy syndrome, not elsewhere classified: Secondary | ICD-10-CM

## 2018-07-22 DIAGNOSIS — G8929 Other chronic pain: Secondary | ICD-10-CM

## 2018-07-22 DIAGNOSIS — Z01812 Encounter for preprocedural laboratory examination: Secondary | ICD-10-CM | POA: Insufficient documentation

## 2018-07-22 DIAGNOSIS — M549 Dorsalgia, unspecified: Secondary | ICD-10-CM | POA: Diagnosis present

## 2018-07-22 LAB — SARS CORONAVIRUS 2 BY RT PCR (HOSPITAL ORDER, PERFORMED IN ~~LOC~~ HOSPITAL LAB): SARS Coronavirus 2: NEGATIVE

## 2018-07-22 NOTE — Patient Instructions (Signed)
Opioid Overdose Opioids are substances that relieve pain by binding to pain receptors in your brain and spinal cord. Opioids include illegal drugs, such as heroin, as well as prescription pain medicines.An opioid overdose happens when you take too much of an opioid substance. This can happen with any type of opioid, including:  Heroin.  Morphine.  Codeine.  Methadone.  Oxycodone.  Hydrocodone.  Fentanyl.  Hydromorphone.  Buprenorphine. The effects of an overdose can be mild, dangerous, or even deadly. Opioid overdose is a medical emergency. What are the causes? This condition may be caused by:  Taking too much of an opioid by accident.  Taking too much of an opioid on purpose.  An error made by a health care provider who prescribes a medicine.  An error made by the pharmacist who fills the prescription order.  Using more than one substance that contains opioids at the same time.  Mixing an opioid with a substance that affects your heart, breathing, or blood pressure. These include alcohol, tranquilizers, sleeping pills, illegal drugs, and some over-the-counter medicines. What increases the risk? This condition is more likely in:  Children. They may be attracted to colorful pills. Because of a child's small size, even a small amount of a drug can be dangerous.  Elderly people. They may be taking many different drugs. Elderly people may have difficulty reading labels or remembering when they last took their medicine.  People who take an opioid on a long-term basis.  People who use: ? Illegal drugs. ? Other substances, including alcohol, while using an opioid.  People who have: ? A history of drug or alcohol abuse. ? Certain mental health conditions.  People who take opioids that are not prescribed for them. What are the signs or symptoms? Symptoms of this condition depend on the type of opioid and the amount that was taken. Common symptoms include:  Sleepiness  or difficulty waking from sleep.  Confusion.  Slurred speech.  Slowed breathing and a slow pulse.  Nausea and vomiting.  Abnormally small pupils. Signs and symptoms that require emergency treatment include:  Cold, clammy, and pale skin.  Blue lips and fingernails.  Vomiting.  Gurgling sounds in the throat.  A pulse that is very slow or difficult to detect.  Breathing that is very slow, noisy, or difficult to detect.  Limp body.  Inability to respond to speech or be awakened from sleep (stupor). How is this diagnosed? This condition is diagnosed based on your symptoms. It is important to tell your health care provider:  All of the opioidsthat you took.  When you took the opioids.  Whether you were drinking alcohol or using other substances. Your health care provider will do a physical exam. This exam may include:  Checking and monitoring your heart rate and rhythm, your breathing rate and depth, your temperature, and your blood pressure (vital signs).  Checking for abnormally small pupils.  Measuring oxygen levels in your blood. You may also have blood tests or urine tests. How is this treated? Supporting your vital signs and your breathing is the first step in treating an opioid overdose. Treatment may also include:  Giving fluids and minerals (electrolytes) through an IV tube.  Inserting a breathing tube (endotracheal tube) in your airway to help you breathe.  Giving oxygen.  Passing a tube through your nose and into your stomach (NG tube, or nasogastric tube) to wash out your stomach.  Giving medicines that: ? Increase your blood pressure. ? Absorb any opioid that  is in your digestive system. ? Reverse the effects of the opioid (naloxone).  Ongoing counseling and mental health support if you intentionally overdosed or used an illegal drug. Follow these instructions at home:   Take over-the-counter and prescription medicines only as told by your  health care provider. Always ask your health care provider about possible side effects and interactions of any new medicine that you start taking.  Keep a list of all of the medicines that you take, including over-the-counter medicines. Bring this list with you to all of your medical visits.  Drink enough fluid to keep your urine clear or pale yellow.  Keep all follow-up visits as told by your health care provider. This is important. How is this prevented?  Get help if you are struggling with: ? Alcohol or drug use. ? Depression or another mental health problem.  Keep the phone number of your local poison control center near your phone or on your cell phone.  Store all medicines in safety containers that are out of the reach of children.  Read the drug inserts that come with your medicines.  Do not drink alcohol when taking opioids.  Do not use illegal drugs.  Do not take opioid medicines that are not prescribed for you. Contact a health care provider if:  Your symptoms return.  You develop new symptoms or side effects when you are taking medicines. Get help right away if:  You think that you or someone else may have taken too much of an opioid. The hotline of the [facility] is 6716947547.  You or someone else is having symptoms of an opioid overdose.  You have serious thoughts about hurting yourself or others.  You have: ? Chest pain. ? Difficulty breathing. ? A loss of consciousness. Opioid overdose is an emergency. Do not wait to see if the symptoms will go away. Get medical help right away. Call your local emergency services (911 in the [place] Do not drive yourself to the hospital. This information is not intended to replace advice given to you by your health care provider. Make sure you discuss any questions you have with your health care provider. Document Released: 03/13/2004 Document Revised: 08/21/2016 Document Reviewed: 07/20/2014 Elsevier  Interactive Patient Education  2019 [employer].

## 2018-07-22 NOTE — Progress Notes (Signed)
Patient's Name: Brian Spencer  MRN: [id number]  Referring Provider: No ref. provider found  DOB: [birth year]  PCP: System, Pcp Not In  DOS: 07/22/2018  Note by: Gaspar Cola, MD  Service setting: Ambulatory outpatient  Specialty: Interventional Pain Management  Patient type: Established  Location: [facility] (AMB) Pain Management Facility  Visit type: Interventional Procedure   Primary Reason for Visit: Interventional Pain Management Treatment. CC: Back Pain  Procedure:          Intrathecal Drug Delivery System (IDDS):  Type: Reservoir Refill [phone]) 15% decrease in rate. Region: Abdominal Laterality: Right  Type of Pump: Medtronic Synchromed II Delivery Route: Intrathecal Type of Pain Treated: Neuropathic/Nociceptive Primary Medication Class: Opioid/opiate  Medication, Concentration, Infusion Program, & Delivery Rate: Please see scanned programming printout.   Indications: 1. Chronic pain syndrome   2. Failed back surgical syndrome  3. Chronic low back pain (Primary Source of Pain) (Bilateral) (R>L)   4. Chronic lower extremity pain (Secondary source of pain) (Right)   5. Presence of intrathecal pump   6. Encounter for adjustment or management of infusion pump    Pain Assessment: Self-Reported Pain Score: 3 /10             Reported level is compatible with observation.        Intrathecal Pump Therapy Assessment  Manufacturer: Medtronic Synchromed Type: Programmable Volume: 40 mL reservoir MRI compatibility: Yes   Drug content:  Primary Medication Class: Opioid Primary Medication: PF-Hydromorphone (Dilaudid) Secondary Medication: PF-Clonidine Other Medication: N/A   Programming:  Type: Simple continuous. See pump readout for details.   Changes:  Medication Change: None at this point Rate Change: 15% Decrease  Reported side-effects or adverse reactions: None reported  Effectiveness: Described as relatively effective, allowing for increase in activities of daily living  (ADL) Clinically meaningful improvement in function (CMIF): Sustained CMIF goals met  Plan: Pump refill today  Pre-op Assessment:  Brian Spencer is a 68 y.o. (year old), male patient, seen today for interventional treatment. 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 Brian Spencer has a current medication list which includes the following prescription(s): albuterol, AMBULATORY NON FORMULARY MEDICATION, amlodipine, apixaban, atorvastatin, buspirone, coenzyme M27, folic acid, furosemide, methotrexate, multivitamin, pantoprazole, propranolol, tamsulosin, tiotropium bromide-olodaterol, trazodone, venlafaxine xr, verapamil, and PAIN MANAGEMENT IT PUMP REFILL. His primarily concern today is the Back Pain  Initial Vital Signs:  Pulse/HCG Rate: (!) 53  Temp: 98.1 F (36.7 C) Resp: 18 BP: (!) 155/66 SpO2: 96 %  BMI: Estimated body mass index is 39.48 kg/m as calculated from the following:   Height as of this encounter: 5' 4"  (1.626 m).   Weight as of this encounter: 230 lb (104.3 kg).  Risk Assessment: Allergies: Reviewed. He is allergic to baclofen and hydralazine.  Allergy Precautions: None required Coagulopathies: Reviewed. None identified.   Blood-thinner therapy: None at this time Active Infection(s): Reviewed. None identified. Brian Spencer is afebrile  Site Confirmation: Brian Spencer  was asked to confirm the procedure and laterality before marking the site Procedure checklist: Completed Consent: Before the procedure and under the influence of no sedative(s), amnesic(s), or anxiolytics, the patient was informed of the treatment options, risks and possible complications. To fulfill our ethical and legal obligations, as recommended by the American Medical Association's Code of Ethics, I have informed the patient of my clinical impression; the nature and purpose of the treatment or procedure; the risks, benefits, and possible complications of the intervention; the alternatives, including doing nothing; the risk(s) and benefit(s) of the alternative treatment(s) or procedure(s); and the risk(s) and benefit(s) of doing nothing.  Brian Spencer was provided with information about the general risks and possible complications associated with most interventional procedures. These include, but are not limited to: failure to achieve desired goals, infection, bleeding, organ or nerve damage, allergic reactions, paralysis, and/or death.  In addition, he was informed of those risks and possible complications associated to this particular procedure, which include, but are not limited to: damage to the implant; failure to decrease pain; local, systemic, or serious CNS infections, intraspinal abscess with possible cord compression and paralysis, or life-threatening such as meningitis; bleeding; organ damage; nerve injury or damage with subsequent sensory, motor, and/or autonomic system dysfunction, resulting in transient or permanent pain, numbness, and/or weakness of one or several areas of the body; allergic reactions, either minor or major life-threatening, such as anaphylactic or anaphylactoid reactions.  Furthermore, Brian Spencer was informed of those risks and  complications associated with the medications. These include, but are not limited to: allergic reactions (i.e.: anaphylactic or anaphylactoid reactions); endorphine suppression; bradycardia and/or hypotension; water retention and/or peripheral vascular relaxation leading to lower extremity edema and possible stasis ulcers; respiratory depression and/or shortness of breath; decreased metabolic rate leading to weight gain; swelling or edema; medication-induced neural toxicity; particulate matter embolism and blood vessel occlusion with resultant organ, and/or nervous system infarction; and/or intrathecal granuloma formation with possible spinal cord compression and permanent paralysis.  Before refilling the pump Brian Spencer was informed that some of the medications used in the devise may not be FDA approved for such use and therefore it constitutes an off-label use of the medications.  Finally, he was informed that Medicine is not an exact science; therefore, there is also the possibility of unforeseen or unpredictable risks and/or possible complications that may result in a catastrophic outcome. The patient indicated having understood very clearly. We have given the patient no guarantees and we have made no promises. Enough time was given to the patient to ask questions, all of which were answered to the patient's satisfaction. Brian Spencer has indicated that he wanted to continue with the procedure. Attestation: I, the ordering provider, attest that I have discussed with the patient the benefits, risks, side-effects, alternatives, likelihood of achieving goals, and potential problems during recovery for the procedure that I have provided informed consent. Date  Time: 07/22/2018 11:36 AM  Pre-Procedure Preparation:  Monitoring: As per clinic protocol. Respiration, ETCO2, SpO2, BP, heart rate and rhythm monitor placed and checked for adequate function Safety Precautions: Patient was assessed for positional comfort  and pressure points before starting the procedure. Time-out: I initiated and conducted the "Time-out" before starting the procedure, as per protocol. The patient was asked to participate by confirming the accuracy of the "Time Out" information. Verification of the correct person, site, and procedure were performed and confirmed by me, the nursing staff, and the patient. "Time-out" conducted as per Joint Commission's Universal Protocol (  UP.01.01.01). Time: 1154  Description of Procedure:          Position: Supine Target Area: Central-port of intrathecal pump. Approach: Anterior, 90 degree angle approach. Area Prepped: Entire Area around the pump implant. Prepping solution: 57M DuraPrep (Iodine Povacrylex [0.7% available iodine] and Isopropyl Alcohol, 74% w/w) Safety Precautions: Aspiration looking for blood return was conducted prior to all injections. At no point did we inject any substances, as a needle was being advanced. No attempts were made at seeking any paresthesias. Safe injection practices and needle disposal techniques used. Medications properly checked for expiration dates. SDV (single dose vial) medications used. Description of the Procedure: Protocol guidelines were followed. Two nurses trained to do implant refills were present during the entire procedure. The refill medication was checked by both healthcare providers as well as the patient. The patient was included in the "Time-out" to verify the medication. The patient was placed in position. The pump was identified. The area was prepped in the usual manner. The sterile template was positioned over the pump, making sure the side-port location matched that of the pump. Both, the pump and the template were held for stability. The needle provided in the Medtronic Kit was then introduced thru the center of the template and into the central port. The pump content was aspirated and discarded volume documented. The new medication was slowly  infused into the pump, thru the filter, making sure to avoid overpressure of the device. The needle was then removed and the area cleansed, making sure to leave some of the prepping solution back to take advantage of its long term bactericidal properties. The pump was interrogated and programmed to reflect the correct medication, volume, and dosage. The program was printed and taken to the physician for approval. Once checked and signed by the physician, a copy was provided to the patient and another scanned into the EMR. Vitals:   07/22/18 1135  BP: (!) 155/66  Pulse: (!) 53  Resp: 18  Temp: 98.1 F (36.7 C)  SpO2: 96%  Weight: 230 lb (104.3 kg)  Height: 5' 4"  (1.626 m)    Start Time: 1156 hrs. End Time: 1220 hrs. (Network was down) Materials & Medications: Medtronic Refill Kit Medication(s): Please see chart orders for details.  Imaging Guidance:          Type of Imaging Technique: None used Indication(s): N/A Exposure Time: No patient exposure Contrast: None used. Fluoroscopic Guidance: N/A Ultrasound Guidance: N/A Interpretation: N/A  Antibiotic Prophylaxis:   Anti-infectives (From admission, onward)   None     Indication(s): None identified  Post-operative Assessment:  Post-procedure Vital Signs:  Pulse/HCG Rate: (!) 53  Temp: 98.1 F (36.7 C) Resp: 18 BP: (!) 155/66 SpO2: 96 %  EBL: None  Complications: No immediate post-treatment complications observed by team, or reported by patient.  Note: The patient tolerated the entire procedure well. A repeat set of vitals were taken after the procedure and the patient was kept under observation following institutional policy, for this type of procedure. Post-procedural neurological assessment was performed, showing return to baseline, prior to discharge. The patient was provided with post-procedure discharge instructions, including a section on how to identify potential problems. Should any problems arise concerning this  procedure, the patient was given instructions to immediately contact us, at any time, without hesitation. In any case, we plan to contact the patient by telephone for a follow-up status report regarding this interventional procedure.  Comments:  No additional relevant information.  Plan of Care  Orders:  Orders Placed This Encounter  Procedures  . PUMP REFILL    Maintain Protocol by having two(2) healthcare providers during procedure and programming.    Scheduling Instructions:     Please refill intrathecal pump today.    Order Specific Question:   Where will this procedure be performed?    Answer:   [facility]  . PUMP REFILL    Whenever possible schedule on a procedure today.    Standing Status:   Future    Standing Expiration Date:   12/19/2018    Scheduling Instructions:     Please schedule intrathecal pump refill based on pump programming. Avoid schedule intervals of more than 120 days (4 months).    Order Specific Question:   Where will this procedure be performed?    Answer:   [facility]  . PUMP REPROGRAM    Follow programming protocol by having two(2) healthcare providers present during programming.    Scheduling Instructions:     Please perform the following adjustment: Decrease rate by 15%.    Order Specific Question:   Where will this procedure be performed?    Answer:   [facility]  . Novel Coronavirus, NAA ([facility])    Standing Status:   Future    Standing Expiration Date:   10/19/2018    Order Specific Question:   Known Exposure    Answer:   None. Pre-op testing  . Informed Consent Details: Transcribe to consent form and obtain patient signature    Consent Attestation: I, the ordering provider, attest that I have discussed with the patient the benefits, risks, side-effects, alternatives, likelihood of achieving goals, and potential problems during recovery for the procedure that I have provided informed consent.    Scheduling Instructions:      Procedure: Intrathecal Pump Refill     Attending Physician: Beatriz Chancellor A. Dossie Arbour, MD     Indications: Chronic Pain Syndrome (G89.4)   Medications ordered for procedure: No orders of the defined types were placed in this encounter.  Medications administered: Brian Spencer had no medications administered during this visit.  See the medical record for exact dosing, route, and time of administration.  Disposition: Discharge home  Discharge Date & Time: 07/22/2018; 1240 hrs.   Follow-up plan:   Return for Pump Refill (Max:45mo.     Future Appointments  Date Time Provider [place] 10/28/2018 11:00 AM [facility]ilinda [facility]   Primary Care Physician: System, Pcp Not In [facility] Note by: FGaspar Cola MD Date: 07/22/2018; Time: 2:03 PM  Disclaimer:  Medicine is not an eChief Strategy Officer The only guarantee in medicine is that nothing is guaranteed. It is important to note that the decision to proceed with this intervention was based on the information collected from the patient. The Data and conclusions were drawn from the patient's questionnaire, the interview, and the physical examination. Because the information was provided in large part by the patient, it cannot be guaranteed that it has not been purposely or unconsciously manipulated. Every effort has been made to obtain as much relevant data as possible for this evaluation. It is important to note that the conclusions that lead to this procedure are derived in large part from the available data. Always take into account that the treatment will also be dependent on availability of resources and existing treatment guidelines, considered by other Pain Management Practitioners as being common knowledge and practice, at the time of the intervention.  For Medico-Legal purposes, it is also important to point out that variation in procedural techniques and pharmacological choices are the  acceptable norm. The indications, contraindications, technique, and results of the above procedure should only be interpreted and judged by a Board-Certified Interventional Pain Specialist with extensive familiarity and expertise in the same exact procedure and technique.

## 2018-07-22 NOTE — Progress Notes (Signed)
Safety precautions to be maintained throughout the outpatient stay will include: orient to surroundings, keep bed in low position, maintain call bell within reach at all times, provide assistance with transfer out of bed and ambulation.  

## 2018-07-23 ENCOUNTER — Telehealth: Payer: Self-pay | Admitting: *Deleted

## 2018-07-23 NOTE — Telephone Encounter (Signed)
No problems post pump fill. 

## 2018-07-26 DIAGNOSIS — M65322 Trigger finger, left index finger: Secondary | ICD-10-CM | POA: Insufficient documentation

## 2018-07-27 MED FILL — Medication: INTRATHECAL | Qty: 1 | Status: AC

## 2018-10-07 ENCOUNTER — Other Ambulatory Visit: Payer: Self-pay

## 2018-10-07 MED ORDER — PAIN MANAGEMENT IT PUMP REFILL: 1.0000 | Freq: Once | INTRATHECAL | 0 refills | Status: DC

## 2018-10-20 ENCOUNTER — Encounter: Payer: Self-pay | Admitting: Pain Medicine

## 2018-10-20 ENCOUNTER — Ambulatory Visit: Payer: Worker's Compensation | Attending: Pain Medicine | Admitting: Pain Medicine

## 2018-10-20 ENCOUNTER — Other Ambulatory Visit: Payer: Self-pay

## 2018-10-20 VITALS — BP 130/65 | HR 72 | Temp 98.2°F | Ht 65.0 in | Wt 228.0 lb

## 2018-10-20 DIAGNOSIS — G894 Chronic pain syndrome: Secondary | ICD-10-CM | POA: Diagnosis not present

## 2018-10-20 DIAGNOSIS — M961 Postlaminectomy syndrome, not elsewhere classified: Secondary | ICD-10-CM | POA: Diagnosis present

## 2018-10-20 DIAGNOSIS — M5441 Lumbago with sciatica, right side: Secondary | ICD-10-CM | POA: Insufficient documentation

## 2018-10-20 DIAGNOSIS — G8929 Other chronic pain: Secondary | ICD-10-CM

## 2018-10-20 DIAGNOSIS — Z978 Presence of other specified devices: Secondary | ICD-10-CM

## 2018-10-20 DIAGNOSIS — Z451 Encounter for adjustment and management of infusion pump: Secondary | ICD-10-CM

## 2018-10-20 DIAGNOSIS — M79604 Pain in right leg: Secondary | ICD-10-CM | POA: Diagnosis present

## 2018-10-20 NOTE — Progress Notes (Signed)
Patient's Name: Brian Spencer  MRN: [id number]  Referring Provider: No ref. provider found  DOB: August 27, 1950  PCP: System, Pcp Not In  DOS: 10/20/2018  Note by: Gaspar Cola, MD  Service setting: Ambulatory outpatient  Specialty: Interventional Pain Management  Patient type: Established  Location: [facility] (AMB) Pain Management Facility  Visit type: Interventional Procedure   Primary Reason for Visit: Interventional Pain Management Treatment. CC: Back Pain  Procedure:          Intrathecal Drug Delivery System (IDDS):  Type: Reservoir Refill [phone]) 20% rate decrease Region: Abdominal Laterality: Right  Type of Pump: Medtronic Synchromed II Delivery Route: Intrathecal Type of Pain Treated: Neuropathic/Nociceptive Primary Medication Class: Opioid/opiate  Medication, Concentration, Infusion Program, & Delivery Rate: Please see scanned programming printout.   Indications: 1. Chronic pain syndrome   2. Failed back surgical syndrome   3. Chronic low back pain (Primary Source of Pain) (Bilateral) (R>L)   4. Chronic lower extremity pain (Secondary source of pain) (Right)   5. Presence of intrathecal pump   6. Encounter for adjustment or management of infusion pump    Pain Assessment: Self-Reported Pain Score: 4 /10             Reported level is compatible with observation.        Pharmacotherapy Assessment  Analgesic: Intrathecal Hydromorphone (Dilaudid). MME: 133.3 mg/day.   Monitoring: Pharmacotherapy: No side-effects or adverse reactions reported. [place] PMP: PDMP reviewed during this encounter.       Compliance: No problems identified. Effectiveness: Clinically acceptable. Plan: Refer to "POC".  UDS:  Summary  Date Value Ref Range Status  11/12/2015 FINAL  Final    Comment:    ==================================================================== [facility] COMP DRUG ANALYSIS,UR ==================================================================== Test                              Result       Flag       Units Drug Present and Declared for Prescription Verification   7-aminoclonazepam              82           EXPECTED   ng/mg creat    7-aminoclonazepam is an expected metabolite of clonazepam. Source    of clonazepam is a scheduled prescription medication.   Trazodone                      PRESENT      EXPECTED   1,3 chlorophenyl piperazine    PRESENT      EXPECTED    1,3-chlorophenyl piperazine is an expected metabolite of    trazodone.   Verapamil                      PRESENT      EXPECTED Drug Present not Declared for Prescription Verification   Hydromorphone                  1656         UNEXPECTED ng/mg creat    Hydromorphone may be administered as a scheduled prescription    medication; it is also an expected metabolite of hydrocodone. Drug Absent but Declared for Prescription Verification   Venlafaxine                    Not Detected UNEXPECTED ==================================================================== Test  Result    Flag   Units      Ref Range   Creatinine              73               mg/dL      >=20 ==================================================================== Declared Medications:  The flagging and interpretation on this report are based on the  following declared medications.  Unexpected results may arise from  inaccuracies in the declared medications.  **Note: The testing scope of this panel includes these medications:  Clonazepam (Klonopin)  Trazodone (Desyrel)  Venlafaxine (Effexor)  Verapamil (Calan)  **Note: The testing scope of this panel does not include following  reported medications:  Albuterol  Budenoside (Symbicort)  Chlorthalidone  Formoterol (Symbicort)  Multivitamin  Tamsulosin (Flomax)  Tiotropium (Spiriva)  Ubiquinone (Coenzyme Q 10)  Vitamin D2 (Ergocalciferol) ==================================================================== For clinical consultation, please call [phone]. ====================================================================    Intrathecal Pump Therapy Assessment  Manufacturer: Medtronic Synchromed Type: Programmable Volume: 40 mL reservoir MRI compatibility: Yes   Drug content:  Primary Medication Class: Opioid Primary Medication: PF-Hydromorphone (Dilaudid) (25 mg/mL) Secondary Medication: PF-Clonidine (400 mcg/mL) Other Medication: N/A   Programming:  Type: Simple continuous. See pump readout for details.   Changes:  Medication Change: None at this point Rate Change: No change in rate  Reported side-effects or adverse reactions: None reported  Effectiveness: Described as relatively effective, allowing for increase in activities of daily living (ADL) Clinically meaningful improvement in function (CMIF): Sustained CMIF goals met  Plan: Pump refill today  Pre-op Assessment:  Brian Spencer is a 68 y.o. (year old), male patient, seen today for interventional treatment. 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 Brian Spencer has a current medication list which includes the following prescription(s): albuterol, AMBULATORY NON FORMULARY MEDICATION, amlodipine, apixaban, atorvastatin,  buspirone, coenzyme M75, folic acid, furosemide, methotrexate, multivitamin, pantoprazole, propranolol, tamsulosin, tiotropium bromide-olodaterol, trazodone, venlafaxine xr, verapamil, and PAIN MANAGEMENT IT PUMP REFILL. His primarily concern today is the Back Pain  Initial Vital Signs:  Pulse/HCG Rate: 72  Temp: 98.2 F (36.8 C) Resp:   BP: 130/65 SpO2: 92 %(3 liters)  BMI: Estimated body mass index is 37.94 kg/m as calculated from the following:   Height as of this encounter: 5' 5"  (1.651 m).   Weight as of this encounter: 228 lb (103.4 kg).  Risk Assessment: Allergies: Reviewed. He is allergic to baclofen and hydralazine.  Allergy Precautions: None required Coagulopathies: Reviewed. None identified.  Blood-thinner therapy: None at this time Active Infection(s): Reviewed. None identified. Mr. Quinney is afebrile  Site Confirmation: Mr. Stoffers was asked to confirm the procedure and laterality before marking the site Procedure checklist: Completed Consent: Before the procedure and under the influence of no sedative(s), amnesic(s), or anxiolytics, the patient was informed of the treatment options, risks and possible complications. To fulfill our ethical and legal obligations, as recommended by the American Medical Association's Code of  Ethics, I have informed the patient of my clinical impression; the nature and purpose of the treatment or procedure; the risks, benefits, and possible complications of the intervention; the alternatives, including doing nothing; the risk(s) and benefit(s) of the alternative treatment(s) or procedure(s); and the risk(s) and benefit(s) of doing nothing.  Mr. Rosol was provided with information about the general risks and possible complications associated with most interventional procedures. These include, but are not limited to: failure to achieve desired goals, infection, bleeding, organ or nerve damage, allergic reactions, paralysis, and/or death.  In addition,  he was informed of those risks and possible complications associated to this particular procedure, which include, but are not limited to: damage to the implant; failure to decrease pain; local, systemic, or serious CNS infections, intraspinal abscess with possible cord compression and paralysis, or life-threatening such as meningitis; bleeding; organ damage; nerve injury or damage with subsequent sensory, motor, and/or autonomic system dysfunction, resulting in transient or permanent pain, numbness, and/or weakness of one or several areas of the body; allergic reactions, either minor or major life-threatening, such as anaphylactic or anaphylactoid reactions.  Furthermore, Mr. Wavra was informed of those risks and complications associated with the medications. These include, but are not limited to: allergic reactions (i.e.: anaphylactic or anaphylactoid reactions); endorphine suppression; bradycardia and/or hypotension; water retention and/or peripheral vascular relaxation leading to lower extremity edema and possible stasis ulcers; respiratory depression and/or shortness of breath; decreased metabolic rate leading to weight gain; swelling or edema; medication-induced neural toxicity; particulate matter embolism and blood vessel occlusion with resultant organ, and/or nervous system infarction; and/or intrathecal granuloma formation with possible spinal cord compression and permanent paralysis.  Before refilling the pump Mr. Mullane was informed that some of the medications used in the devise may not be FDA approved for such use and therefore it constitutes an off-label use of the medications.  Finally, he was informed that Medicine is not an exact science; therefore, there is also the possibility of unforeseen or unpredictable risks and/or possible complications that may result in a catastrophic outcome. The patient indicated having understood very clearly. We have given the patient no guarantees and we have made  no promises. Enough time was given to the patient to ask questions, all of which were answered to the patient's satisfaction. Mr. Kerschner has indicated that he wanted to continue with the procedure. Attestation: I, the ordering provider, attest that I have discussed with the patient the benefits, risks, side-effects, alternatives, likelihood of achieving goals, and potential problems during recovery for the procedure that I have provided informed consent. Date  Time: 10/20/2018 10:30 AM  Pre-Procedure Preparation:  Monitoring: As per clinic protocol. Respiration, ETCO2, SpO2, BP, heart rate and rhythm monitor placed and checked for adequate function Safety Precautions: Patient was assessed for positional comfort and pressure points before starting the procedure. Time-out: I initiated and conducted the "Time-out" before starting the procedure, as per protocol. The patient was asked to participate by confirming the accuracy of the "Time Out" information. Verification of the correct person, site, and procedure were performed and confirmed by me, the nursing staff, and the patient. "Time-out" conducted as per Joint Commission's Universal Protocol (UP.01.01.01). Time: 1045  Description of Procedure:          Position: Supine Target Area: Central-port of intrathecal pump. Approach: Anterior, 90 degree angle approach. Area Prepped: Entire Area around the pump implant. Prepping solution: DuraPrep (Iodine Povacrylex [0.7% available iodine] and Isopropyl Alcohol, 74% w/w) Safety Precautions: Aspiration looking for blood  return was conducted prior to all injections. At no point did we inject any substances, as a needle was being advanced. No attempts were made at seeking any paresthesias. Safe injection practices and needle disposal techniques used. Medications properly checked for expiration dates. SDV (single dose vial) medications used. Description of the Procedure: Protocol guidelines were followed. Two  nurses trained to do implant refills were present during the entire procedure. The refill medication was checked by both healthcare providers as well as the patient. The patient was included in the "Time-out" to verify the medication. The patient was placed in position. The pump was identified. The area was prepped in the usual manner. The sterile template was positioned over the pump, making sure the side-port location matched that of the pump. Both, the pump and the template were held for stability. The needle provided in the Medtronic Kit was then introduced thru the center of the template and into the central port. The pump content was aspirated and discarded volume documented. The new medication was slowly infused into the pump, thru the filter, making sure to avoid overpressure of the device. The needle was then removed and the area cleansed, making sure to leave some of the prepping solution back to take advantage of its long term bactericidal properties. The pump was interrogated and programmed to reflect the correct medication, volume, and dosage. The program was printed and taken to the physician for approval. Once checked and signed by the physician, a copy was provided to the patient and another scanned into the EMR. Vitals:   10/20/18 1028  BP: 130/65  Pulse: 72  Temp: 98.2 F (36.8 C)  SpO2: 92%  Weight: 228 lb (103.4 kg)  Height: 5' 5"  (1.651 m)    Start Time: 1045 hrs. End Time: 1057 hrs. Materials & Medications: Medtronic Refill Kit Medication(s): Please see chart orders for details.  Imaging Guidance:          Type of Imaging Technique: None used Indication(s): N/A Exposure Time: No patient exposure Contrast: None used. Fluoroscopic Guidance: N/A Ultrasound Guidance: N/A Interpretation: N/A  Antibiotic Prophylaxis:   Anti-infectives (From admission, onward)   None     Indication(s): None identified  Post-operative Assessment:  Post-procedure Vital Signs:   Pulse/HCG Rate: 72  Temp: 98.2 F (36.8 C) Resp:   BP: 130/65 SpO2: 92 %(3 liters)  EBL: None  Complications: No immediate post-treatment complications observed by team, or reported by patient.  Note: The patient tolerated the entire procedure well. A repeat set of vitals were taken after the procedure and the patient was kept under observation following institutional policy, for this type of procedure. Post-procedural neurological assessment was performed, showing return to baseline, prior to discharge. The patient was provided with post-procedure discharge instructions, including a section on how to identify potential problems. Should any problems arise concerning this procedure, the patient was given instructions to immediately contact us, at any time, without hesitation. In any case, we plan to contact the patient by telephone for a follow-up status report regarding this interventional procedure.  Comments:  No additional relevant information.  Plan of Care  Orders:  Orders Placed This Encounter  Procedures  . PUMP REFILL    Maintain Protocol by having two(2) healthcare providers during procedure and programming.    Scheduling Instructions:     Please refill intrathecal pump today.    Order Specific Question:   Where will this procedure be performed?    Answer:   [facility]  .  PUMP REFILL    Whenever possible schedule on a procedure today.    Standing Status:   Future    Standing Expiration Date:   03/19/2019    Scheduling Instructions:     Please schedule intrathecal pump refill based on pump programming. Avoid schedule intervals of more than 120 days (4 months).    Order Specific Question:   Where will this procedure be performed?    Answer:   [facility]  . Informed Consent Details: Transcribe to consent form and obtain patient signature    Consent Attestation: I, the ordering provider, attest that I have discussed with the patient the benefits, risks,  side-effects, alternatives, likelihood of achieving goals, and potential problems during recovery for the procedure that I have provided informed consent.    Scheduling Instructions:     Procedure: Intrathecal Pump Refill     Attending Physician: Beatriz Chancellor A. Dossie Arbour, MD     Indications: Chronic Pain Syndrome (G89.4)   Chronic Opioid Analgesic:  Intrathecal Hydromorphone (Dilaudid). MME: 133.3 mg/day.   Medications ordered for procedure: No orders of the defined types were placed in this encounter.  Medications administered: Earmon Sherrow had no medications administered during this visit.  See the medical record for exact dosing, route, and time of administration.  Follow-up plan:   Return for Pump Refill (Max:46mo.       Interventional management options: Planned, scheduled, and/or pending:   The patient has expressed his desire to continue going down on the medications that he is receiving through the intrathecal pump.  Today we had an extensive conversation about this and we have decided to take the following approach. The plan is to change the concentration of the next refill to the following: PF-Hydromorphone: 20 mg/mL (primary) PF-Clonidine: 300 mcg/mL  PF-Bupivacaine: 20 mg/mL  We will run this at a rate of aprox. 3 mg/day of hydromorphone   Considering:   NOTE: ELIQUIS ANTICOAGULATION (Stop: 3days  Re-start: 6 hrs) Diagnostic bilateral lumbar facet blocks #1    Palliative PRN treatment(s):   Continue management of intrathecal pump     Recent Visits Date Type Provider Dept  10/20/18 Procedure visit NMilinda Pointer MD [facility]  Showing recent visits within past 90 days and meeting all other requirements   Future Appointments No visits were found meeting these conditions.  Showing future appointments within next 90 days and meeting all other requirements   Disposition: Discharge home  Discharge Date & Time: 10/20/2018  Primary Care Physician: System,  Pcp Not In [facility] Note by: FGaspar Cola MD Date: 10/20/2018; Time: 6:23 AM  Disclaimer:  Medicine is not an exact science. The only guarantee in medicine is that nothing is guaranteed. It is important to note that the decision to proceed with this intervention was based on the information collected from the patient. The Data and conclusions were drawn from the patient's questionnaire, the interview, and the physical examination. Because the information was provided in large part by the patient, it cannot be guaranteed that it has not been purposely or unconsciously manipulated. Every effort has been made to obtain as much relevant data as possible for this evaluation. It is important to note that the conclusions that lead to this procedure are derived in large part from the available data. Always take into account that the treatment will also be dependent on availability of resources and existing treatment guidelines, considered by other Pain Management Practitioners as being common knowledge and practice,  at the time of the intervention. For Medico-Legal purposes, it is also important to point out that variation in procedural techniques and pharmacological choices are the acceptable norm. The indications, contraindications, technique, and results of the above procedure should only be interpreted and judged by a Board-Certified Interventional Pain Specialist with extensive familiarity and expertise in the same exact procedure and technique.

## 2018-10-28 ENCOUNTER — Encounter: Payer: Self-pay | Admitting: Pain Medicine

## 2018-11-16 MED FILL — Medication: INTRATHECAL | Qty: 1 | Status: AC

## 2018-12-22 ENCOUNTER — Other Ambulatory Visit: Payer: Self-pay

## 2018-12-22 MED ORDER — PAIN MANAGEMENT IT PUMP REFILL: 1.0000 | Freq: Once | INTRATHECAL | 0 refills | Status: DC

## 2019-01-20 ENCOUNTER — Encounter: Payer: Self-pay | Admitting: Pain Medicine

## 2019-01-20 ENCOUNTER — Other Ambulatory Visit: Payer: Self-pay

## 2019-01-20 ENCOUNTER — Ambulatory Visit: Payer: Worker's Compensation | Attending: Pain Medicine | Admitting: Pain Medicine

## 2019-01-20 VITALS — BP 133/69 | HR 56 | Temp 98.1°F | Resp 16 | Ht 64.0 in | Wt 216.0 lb

## 2019-01-20 DIAGNOSIS — M5416 Radiculopathy, lumbar region: Secondary | ICD-10-CM | POA: Insufficient documentation

## 2019-01-20 DIAGNOSIS — M47816 Spondylosis without myelopathy or radiculopathy, lumbar region: Secondary | ICD-10-CM | POA: Insufficient documentation

## 2019-01-20 DIAGNOSIS — Z978 Presence of other specified devices: Secondary | ICD-10-CM | POA: Insufficient documentation

## 2019-01-20 DIAGNOSIS — G8929 Other chronic pain: Secondary | ICD-10-CM | POA: Diagnosis present

## 2019-01-20 DIAGNOSIS — M5441 Lumbago with sciatica, right side: Secondary | ICD-10-CM | POA: Diagnosis present

## 2019-01-20 DIAGNOSIS — Z451 Encounter for adjustment and management of infusion pump: Secondary | ICD-10-CM | POA: Diagnosis present

## 2019-01-20 DIAGNOSIS — M961 Postlaminectomy syndrome, not elsewhere classified: Secondary | ICD-10-CM | POA: Insufficient documentation

## 2019-01-20 DIAGNOSIS — Z01818 Encounter for other preprocedural examination: Secondary | ICD-10-CM | POA: Insufficient documentation

## 2019-01-20 DIAGNOSIS — M79604 Pain in right leg: Secondary | ICD-10-CM | POA: Insufficient documentation

## 2019-01-20 DIAGNOSIS — G894 Chronic pain syndrome: Secondary | ICD-10-CM | POA: Diagnosis not present

## 2019-01-20 NOTE — Patient Instructions (Signed)
Opioid Overdose Opioids are drugs that are often used to treat pain. Opioids include illegal drugs, such as heroin, as well as prescription pain medicines, such as codeine, morphine, hydrocodone, oxycodone, and fentanyl. An opioid overdose happens when you take too much of an opioid. An overdose may be intentional or accidental and can happen with any type of opioid. The effects of an overdose can be mild, dangerous, or even deadly. Opioid overdose is a medical emergency. What are the causes? This condition may be caused by:  Taking too much of an opioid on purpose.  Taking too much of an opioid by accident.  Using two or more substances that contain opioids at the same time.  Taking an opioid with a substance that affects your heart, breathing, or blood pressure. These include alcohol, tranquilizers, sleeping pills, illegal drugs, and some over-the-counter medicines. This condition may also happen due to an error made by:  A health care provider who prescribes a medicine.  The pharmacist who fills the prescription order. What increases the risk? This condition is more likely in:  Children. They may be attracted to colorful pills. Because of a child's small size, even a small amount of a drug can be dangerous.  Older people. They may be taking many different drugs. Older people may have difficulty reading labels or remembering when they last took their medicine. They may also be more sensitive to the effects of opioids.  People with chronic medical conditions, especially heart, liver, kidney, or neurological diseases.  People who take an opioid for a long period of time.  People who use: ? Illegal drugs. IV heroin is especially dangerous. ? Other substances, including alcohol, while using an opioid.  People who have: ? A history of drug or alcohol abuse. ? Certain mental health conditions. ? A history of previous drug overdoses.  People who take opioids that are not prescribed  for them. What are the signs or symptoms? Symptoms of this condition depend on the type of opioid and the amount that was taken. Common symptoms include:  Sleepiness or difficulty waking from sleep.  Decrease in attention.  Confusion.  Slurred speech.  Slowed breathing and a slow pulse (bradycardia).  Nausea and vomiting.  Abnormally small pupils. Signs and symptoms that require emergency treatment include:  Cold, clammy, and pale skin.  Blue lips and fingernails.  Vomiting.  Gurgling sounds in the throat.  A pulse that is very slow or difficult to detect.  Breathing that is very irregular, slow, noisy, or difficult to detect.  Limp body.  Inability to respond to speech or be awakened from sleep (stupor).  Seizures. How is this diagnosed? This condition is diagnosed based on your symptoms and medical history. It is important to tell your health care provider:  About all of the opioids that you took.  When you took the opioids.  Whether you were drinking alcohol or using marijuana, cocaine, or other drugs. Your health care provider will do a physical exam. This exam may include:  Checking and monitoring your heart rate and rhythm, breathing rate, temperature, and blood pressure (vital signs).  Measuring oxygen levels in your blood.  Checking for abnormally small pupils. You may also have blood tests or urine tests. You may have X-rays if you are having severe breathing problems. How is this treated? This condition requires immediate medical treatment and hospitalization. Treatment is given in the hospital intensive care (ICU) setting. Supporting your vital signs and your breathing is the first step in  treating an opioid overdose. Treatment may also include:  Giving salts and minerals (electrolytes) along with fluids through an IV.  Inserting a breathing tube (endotracheal tube) in your airway to help you breathe if you cannot breathe on your own or you are in  danger of not being able to breathe on your own.  Giving oxygen through a small tube under your nose.  Passing a tube through your nose and into your stomach (nasogastric tube, or NG tube) to empty your stomach.  Giving medicines that: ? Increase your blood pressure. ? Relieve nausea and vomiting. ? Relieve abdominal pain and cramping. ? Reverse the effects of the opioid (naloxone).  Monitoring your heart and oxygen levels.  Ongoing counseling and mental health support if you intentionally overdosed or used an illegal drug. Follow these instructions at home:  Medicines  Take over-the-counter and prescription medicines only as told by your health care provider.  Always ask your health care provider about possible side effects and interactions of any new medicine that you start taking.  Keep a list of all the medicines that you take, including over-the-counter medicines. Bring this list with you to all your medical visits. General instructions  Drink enough fluid to keep your urine pale yellow.  Keep all follow-up visits as told by your health care provider. This is important. How is this prevented?  Read the drug inserts that come with your opioid pain medicines.  Take medicines only as told by your health care provider. Do not take more medicine than you are told. Do not take medicines more frequently than you are told.  Do not drink alcohol or take sedatives when taking opioids.  Do not use illegal or recreational drugs, including cocaine, ecstasy, and marijuana.  Do not take opioid medicines that are not prescribed for you.  Store all medicines in safety containers that are out of the reach of children.  Get help if you are struggling with: ? Alcohol or drug use. ? Depression or another mental health problem. ? Thoughts of hurting yourself or another person.  Keep the phone number of your local poison control center near your phone or in your mobile phone. In the  [place] the hotline of the National Poison Control Center is (800) 222-1222.  If you were prescribed naloxone, make sure you understand how to take it. Contact a health care provider if you:  Need help understanding how to take your pain medicines.  Feel your medicines are too strong.  Are concerned that your pain medicines are not working well for your pain.  Develop new symptoms or side effects when you are taking medicines. Get help right away if:  You or someone else is having symptoms of an opioid overdose. Get help even if you are not sure.  You have serious thoughts about hurting yourself or others.  You have: ? Chest pain. ? Difficulty breathing. ? A loss of consciousness. These symptoms may represent a serious problem that is an emergency. Do not wait to see if the symptoms will go away. Get medical help right away. Call your local emergency services (911 in the [place] Do not drive yourself to the hospital. If you ever feel like you may hurt yourself or others, or have thoughts about taking your own life, get help right away. You can go to your nearest emergency department or call:  Your local emergency services (911 in the [place]  A suicide crisis helpline, such as the National Suicide Prevention Lifeline at   (904)328-6582. This is open 24 hours a day. Summary  Opioids are drugs that are often used to treat pain. Opioids include illegal drugs, such as heroin, as well as prescription pain medicines.  An opioid overdose happens when you take too much of an opioid.  Overdoses can be intentional or accidental.  Opioid overdose is very dangerous. It is a life-threatening emergency.  If you or someone you know is experiencing an opioid overdose, get help right away. This information is not intended to replace advice given to you by your health care provider. Make sure you discuss any questions you have with your health care provider. Document Released: 03/13/2004 Document  Revised: 01/21/2018 Document Reviewed: 01/21/2018 [employer] Patient Education  2020 [employer].

## 2019-01-20 NOTE — Progress Notes (Signed)
Patient's Name: Brian Spencer  MRN: [id number]  Referring Provider: No ref. provider found  DOB: 12/03/1950  PCP: System, Pcp Not In  DOS: 01/20/2019  Note by: Brian A Naveira, MD  Service setting: Ambulatory outpatient  Specialty: Interventional Pain Management  Patient type: Established  Location: [facility] (AMB) Pain Management Facility  Visit type: Interventional Procedure   Primary Reason for Visit: Interventional Pain Management Treatment. CC: Back Pain (lower)  Procedure:          Intrathecal Drug Delivery System (IDDS):  Type: Reservoir Refill (95990) 24.6% (25%) decrease in the infusion rate. Region: Abdominal Laterality: Right  Residual: 25 mL  Type of Pump: Medtronic Synchromed II Delivery Route: Intrathecal Type of Pain Treated: Neuropathic/Nociceptive Primary Medication Class: Opioid/opiate  Medication, Concentration, Infusion Program, & Delivery Rate: Please see scanned programming printout.   Indications: 1. Chronic pain syndrome   2. Failed back surgical syndrome   3. Chronic low back pain (Primary Source of Pain) (Bilateral) (R>L)   4. Chronic lower extremity pain (Secondary source of pain) (Right)   5. Presence of intrathecal pump   6. Encounter for adjustment or management of infusion pump   7. Preoperative testing   8. Chronic lumbar radicular pain (L5 dermatome) (Right)   9. Lumbar facet joint syndrome    Pain Assessment: Self-Reported Pain Score: 5 /10             Reported level is compatible with observation.        The patient has expressed his interest in slowly coming off of the medications on the pump.  We have been slowly decreasing the rate and concentration, as tolerated.  To make the transition smoother, today I have added some bupivacaine to the mixture.  Pharmacotherapy Assessment  Analgesic: Intrathecal Hydromorphone (Dilaudid) @ 3.001 mg/mL. MME: 12.004 mg/day.   Monitoring: Pharmacotherapy: No side-effects or adverse reactions reported. [place] PMP:  PDMP reviewed during this encounter.       Compliance: No problems identified. Effectiveness: Clinically acceptable. Plan: Refer to "POC".  UDS:  Summary  Date Value Ref Range Status  11/12/2015 FINAL  Final    Comment:    ==================================================================== [facility] COMP DRUG ANALYSIS,UR ==================================================================== Test                             Result       Flag       Units Drug Present and Declared for Prescription Verification   7-aminoclonazepam              82           EXPECTED   ng/mg creat    7-aminoclonazepam is an expected metabolite of clonazepam. Source    of clonazepam is a scheduled prescription medication.   Trazodone                      PRESENT      EXPECTED   1,3 chlorophenyl piperazine    PRESENT      EXPECTED    1,3-chlorophenyl piperazine is an expected metabolite of    trazodone.   Verapamil                      PRESENT      EXPECTED Drug Present not Declared for Prescription Verification   Hydromorphone                  1656           UNEXPECTED ng/mg creat    Hydromorphone may be administered as a scheduled prescription    medication; it is also an expected metabolite of hydrocodone. Drug Absent but Declared for Prescription Verification   Venlafaxine                    Not Detected UNEXPECTED ==================================================================== Test                      Result    Flag   Units      Ref Range   Creatinine              73               mg/dL      >=20 ==================================================================== Declared Medications:  The flagging and interpretation on this report are based on the  following declared medications.  Unexpected results may arise from  inaccuracies in the declared medications.  **Note: The testing scope of this panel includes these medications:  Clonazepam (Klonopin)  Trazodone (Desyrel)  Venlafaxine (Effexor)   Verapamil (Calan)  **Note: The testing scope of this panel does not include following  reported medications:  Albuterol  Budenoside (Symbicort)  Chlorthalidone  Formoterol (Symbicort)  Multivitamin  Tamsulosin (Flomax)  Tiotropium (Spiriva)  Ubiquinone (Coenzyme Q 10)  Vitamin D2 (Ergocalciferol) ==================================================================== For clinical consultation, please call [phone]. ====================================================================    Intrathecal Pump Therapy Assessment  Manufacturer: Medtronic Synchromed Type: Programmable Volume: 40 mL reservoir MRI compatibility: Yes   Drug content (Current):  Primary Medication Class: Opioid Primary Medication: PF-Hydromorphone (Dilaudid) (25 mg/mL) Secondary Medication: PF-Clonidine (400 g/mL) Other Medication: None  Drug content (NEW):  Primary Medication Class: Opioid Primary Medication: PF-Hydromorphone (Dilaudid) (20 mg/mL) Secondary Medication: PF-Bupivacaine (20 mg/mL) Other Medication: PF-Clonidine (300 g/mL)  Programming:  Type: Simple continuous. See pump readout for details.   Changes:  Medication Change: Today we decreased the concentration on the hydromorphone and the clonidine.  In addition, we added preservative-free bupivacaine. Rate Change: Today we also decreased the rate by 25%  Reported side-effects or adverse reactions: None reported  Effectiveness: Described as relatively effective, allowing for increase in activities of daily living (ADL) Clinically meaningful improvement in function (CMIF): Sustained CMIF goals met  Plan: Pump refill today  Pre-op Assessment:  Brian Spencer is a 68 y.o. (year old), male patient, seen today for interventional treatment. 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 Mr. Gershman has a current medication list which includes the following prescription(s): albuterol, AMBULATORY NON FORMULARY MEDICATION, amlodipine, apixaban, atorvastatin, buspirone, coenzyme q10, folic acid, furosemide, methotrexate, multivitamin, pantoprazole, propranolol, tamsulosin, tiotropium bromide-olodaterol, trazodone, venlafaxine xr, verapamil, and PAIN MANAGEMENT IT PUMP REFILL. His primarily concern today is the Back Pain (lower)  Initial Vital Signs:  Pulse/HCG Rate: (!) 56  Temp: 98.1 F (36.7 C) Resp: 16 BP: 133/69 SpO2: 95 %  BMI: Estimated body mass index is 37.08 kg/m as calculated   from the following:   Height as of this encounter: 5' 4" (1.626 m).   Weight as of this encounter: 216 lb (98 kg).  Risk Assessment: Allergies: Reviewed. He is allergic to baclofen and hydralazine.  Allergy Precautions: None required Coagulopathies: Reviewed. None identified.  Blood-thinner therapy: None at this time Active Infection(s): Reviewed. None identified. Mr. Broz is afebrile  Site Confirmation: Mr. Santana was asked to confirm the procedure and laterality before marking the site Procedure checklist: Completed Consent: Before the procedure and under the influence of no sedative(s), amnesic(s), or anxiolytics, the patient was informed of the treatment options, risks and possible  complications. To fulfill our ethical and legal obligations, as recommended by the American Medical Association's Code of Ethics, I have informed the patient of my clinical impression; the nature and purpose of the treatment or procedure; the risks, benefits, and possible complications of the intervention; the alternatives, including doing nothing; the risk(s) and benefit(s) of the alternative treatment(s) or procedure(s); and the risk(s) and benefit(s) of doing nothing.  Mr. Tolan was provided with information about the general risks and possible complications associated with most interventional procedures. These include, but are not limited to: failure to achieve desired goals, infection, bleeding, organ or nerve damage, allergic reactions, paralysis, and/or death.  In addition, he was informed of those risks and possible complications associated to this particular procedure, which include, but are not limited to: damage to the implant; failure to decrease pain; local, systemic, or serious CNS infections, intraspinal abscess with possible cord compression and paralysis, or life-threatening such as meningitis; bleeding; organ damage; nerve injury or damage with subsequent sensory, motor, and/or autonomic system dysfunction, resulting in transient or permanent pain, numbness, and/or weakness of one or several areas of the body; allergic reactions, either minor or major life-threatening, such as anaphylactic or anaphylactoid reactions.  Furthermore, Mr. Bjorkman was informed of those risks and complications associated with the medications. These include, but are not limited to: allergic reactions (i.e.: anaphylactic or anaphylactoid reactions); endorphine suppression; bradycardia and/or hypotension; water retention and/or peripheral vascular relaxation leading to lower extremity edema and possible stasis ulcers; respiratory depression and/or shortness of breath; decreased metabolic rate leading to weight gain;  swelling or edema; medication-induced neural toxicity; particulate matter embolism and blood vessel occlusion with resultant organ, and/or nervous system infarction; and/or intrathecal granuloma formation with possible spinal cord compression and permanent paralysis.  Before refilling the pump Mr. Gatlin was informed that some of the medications used in the devise may not be FDA approved for such use and therefore it constitutes an off-label use of the medications.  Finally, he was informed that Medicine is not an exact science; therefore, there is also the possibility of unforeseen or unpredictable risks and/or possible complications that may result in a catastrophic outcome. The patient indicated having understood very clearly. We have given the patient no guarantees and we have made no promises. Enough time was given to the patient to ask questions, all of which were answered to the patient's satisfaction. Mr. Schweikert has indicated that he wanted to continue with the procedure. Attestation: I, the ordering provider, attest that I have discussed with the patient the benefits, risks, side-effects, alternatives, likelihood of achieving goals, and potential problems during recovery for the procedure that I have provided informed consent. Date  Time: 01/20/2019 10:49 AM  Pre-Procedure Preparation:  Monitoring: As per clinic protocol. Respiration, ETCO2, SpO2, BP, heart rate and rhythm monitor placed and checked for adequate function Safety Precautions: Patient was assessed  for positional comfort and pressure points before starting the procedure. Time-out: I initiated and conducted the "Time-out" before starting the procedure, as per protocol. The patient was asked to participate by confirming the accuracy of the "Time Out" information. Verification of the correct person, site, and procedure were performed and confirmed by me, the nursing staff, and the patient. "Time-out" conducted as per Joint Commission's  Universal Protocol (UP.01.01.01). Time: 1109  Description of Procedure:          Position: Supine Target Area: Central-port of intrathecal pump. Approach: Anterior, 90 degree angle approach. Area Prepped: Entire Area around the pump implant. Prepping solution: DuraPrep (Iodine Povacrylex [0.7% available iodine] and Isopropyl Alcohol, 74% w/w) Safety Precautions: Aspiration looking for blood return was conducted prior to all injections. At no point did we inject any substances, as a needle was being advanced. No attempts were made at seeking any paresthesias. Safe injection practices and needle disposal techniques used. Medications properly checked for expiration dates. SDV (single dose vial) medications used. Description of the Procedure: Protocol guidelines were followed. Two nurses trained to do implant refills were present during the entire procedure. The refill medication was checked by both healthcare providers as well as the patient. The patient was included in the "Time-out" to verify the medication. The patient was placed in position. The pump was identified. The area was prepped in the usual manner. The sterile template was positioned over the pump, making sure the side-port location matched that of the pump. Both, the pump and the template were held for stability. The needle provided in the Medtronic Kit was then introduced thru the center of the template and into the central port. The pump content was aspirated and discarded volume documented. The new medication was slowly infused into the pump, thru the filter, making sure to avoid overpressure of the device. The needle was then removed and the area cleansed, making sure to leave some of the prepping solution back to take advantage of its long term bactericidal properties. The pump was interrogated and programmed to reflect the correct medication, volume, and dosage. The program was printed and taken to the physician for approval. Once checked  and signed by the physician, a copy was provided to the patient and another scanned into the EMR. Vitals:   01/20/19 1048  BP: 133/69  Pulse: (!) 56  Resp: 16  Temp: 98.1 F (36.7 C)  TempSrc: Temporal  SpO2: 95%  Weight: 216 lb (98 kg)  Height: 5' 4" (1.626 m)    Start Time: 1111 hrs.  Materials & Medications: Medtronic Refill Kit Medication(s): Please see chart orders for details.  Imaging Guidance:          Type of Imaging Technique: None used Indication(s): N/A Exposure Time: No patient exposure Contrast: None used. Fluoroscopic Guidance: N/A Ultrasound Guidance: N/A Interpretation: N/A  Antibiotic Prophylaxis:   Anti-infectives (From admission, onward)   None     Indication(s): None identified  Post-operative Assessment:  Post-procedure Vital Signs:  Pulse/HCG Rate: (!) 56  Temp: 98.1 F (36.7 C) Resp: 16 BP: 133/69 SpO2: 95 %  EBL: None  Complications: No immediate post-treatment complications observed by team, or reported by patient.  Note: The patient tolerated the entire procedure well. A repeat set of vitals were taken after the procedure and the patient was kept under observation following institutional policy, for this type of procedure. Post-procedural neurological assessment was performed, showing return to baseline, prior to discharge. The patient was provided with post-procedure discharge instructions,   including a section on how to identify potential problems. Should any problems arise concerning this procedure, the patient was given instructions to immediately contact us, at any time, without hesitation. In any case, we plan to contact the patient by telephone for a follow-up status report regarding this interventional procedure.  Comments:  No additional relevant information.  Plan of Care  Orders:  Orders Placed This Encounter  Procedures  . Pump Refill (Today)    Maintain Protocol by having two(2) healthcare providers during procedure and  programming.    Scheduling Instructions:     Please refill intrathecal pump today.    Order Specific Question:   Where will this procedure be performed?    Answer:   [facility]  . Pump Refill (Schedule Return)    Whenever possible schedule on a procedure today.    Standing Status:   Future    Standing Expiration Date:   06/19/2019    Scheduling Instructions:     Please schedule intrathecal pump refill based on pump programming. Avoid schedule intervals of more than 120 days (4 months).    Order Specific Question:   Where will this procedure be performed?    Answer:   [facility]  . Consent: Pump Refill    Provider Attestation: I, Brian A. Naveira, MD, (Pain Management Specialist), the physician/practitioner, attest that I have discussed with the patient the benefits, risks, side effects, alternatives, likelihood of achieving goals and potential problems during recovery for the procedure that I have provided informed consent.    Scheduling Instructions:     Procedure: Intrathecal Pump Refill     Attending Physician: Brian A. Naveira, MD     Indications: Chronic Pain Syndrome (G89.4)     Transcribe to consent form and obtain patient signature.   Chronic Opioid Analgesic:  Intrathecal Hydromorphone (Dilaudid) @ 3.001 mg/mL. MME: 12.004 mg/day.   Medications ordered for procedure: No orders of the defined types were placed in this encounter.  Medications administered: Jayvyn Kareem had no medications administered during this visit.  See the medical record for exact dosing, route, and time of administration.  Follow-up plan:   Return for Pump Refill (Max:3mo).       Interventional management options: Planned, scheduled, and/or pending:   The patient has expressed his desire to continue going down on the medications that he is receiving through the intrathecal pump.  Today we had an extensive conversation about this and we have decided to take the following  approach. The plan is to change the concentration of the next refill to the following: PF-Hydromorphone: 10 mg/mL (primary)  PF-Bupivacaine: 10 mg/mL  PF-Clonidine: 150 mcg/mL   We will run this at a rate of aprox. 2.4 mg/day of hydromorphone (20% decrease)   Considering:   NOTE: ELIQUIS ANTICOAGULATION (Stop: 3days  Re-start: 6 hrs) Diagnostic bilateral lumbar facet blocks #1    Palliative PRN treatment(s):   Continue management of intrathecal pump     Recent Visits No visits were found meeting these conditions.  Showing recent visits within past 90 days and meeting all other requirements   Today's Visits Date Type Provider Dept  01/20/19 Procedure visit Spencer, Francisco, MD [facility]  Showing today's visits and meeting all other requirements   Future Appointments No visits were found meeting these conditions.  Showing future appointments within next 90 days and meeting all other requirements   Disposition: Discharge home  Discharge Date & Time: 01/20/2019; 1131 hrs.   Primary Care Physician:   System, Pcp Not In Location: [facility] Note by: Brian A Naveira, MD Date: 01/20/2019; Time: 12:48 PM  Disclaimer:  Medicine is not an exact science. The only guarantee in medicine is that nothing is guaranteed. It is important to note that the decision to proceed with this intervention was based on the information collected from the patient. The Data and conclusions were drawn from the patient's questionnaire, the interview, and the physical examination. Because the information was provided in large part by the patient, it cannot be guaranteed that it has not been purposely or unconsciously manipulated. Every effort has been made to obtain as much relevant data as possible for this evaluation. It is important to note that the conclusions that lead to this procedure are derived in large part from the available data. Always take into account  that the treatment will also be dependent on availability of resources and existing treatment guidelines, considered by other Pain Management Practitioners as being common knowledge and practice, at the time of the intervention. For Medico-Legal purposes, it is also important to point out that variation in procedural techniques and pharmacological choices are the acceptable norm. The indications, contraindications, technique, and results of the above procedure should only be interpreted and judged by a Board-Certified Interventional Pain Specialist with extensive familiarity and expertise in the same exact procedure and technique. 

## 2019-01-20 NOTE — Progress Notes (Signed)
Safety precautions to be maintained throughout the outpatient stay will include: orient to surroundings, keep bed in low position, maintain call bell within reach at all times, provide assistance with transfer out of bed and ambulation.  

## 2019-01-23 MED FILL — Medication: INTRATHECAL | Qty: 1 | Status: AC

## 2019-01-28 ENCOUNTER — Telehealth: Payer: Self-pay

## 2019-01-28 NOTE — Telephone Encounter (Signed)
Patient called and states that he is getting bills from [facility] health and  Is workers comp and shouldn't be getting bills.  Blanch [employer], will you please check on this and see if you can find the issue?  Thank you

## 2019-04-14 ENCOUNTER — Other Ambulatory Visit: Payer: Self-pay

## 2019-04-14 MED ORDER — PAIN MANAGEMENT IT PUMP REFILL: 1.0000 | Freq: Once | INTRATHECAL | 0 refills | Status: DC

## 2019-05-03 ENCOUNTER — Ambulatory Visit: Payer: Worker's Compensation | Attending: Pain Medicine | Admitting: Pain Medicine

## 2019-05-03 ENCOUNTER — Other Ambulatory Visit: Payer: Self-pay

## 2019-05-03 ENCOUNTER — Encounter: Payer: Self-pay | Admitting: Pain Medicine

## 2019-05-03 VITALS — BP 142/74 | HR 56 | Temp 98.2°F | Resp 18 | Ht 64.0 in | Wt 215.0 lb

## 2019-05-03 DIAGNOSIS — F1190 Opioid use, unspecified, uncomplicated: Secondary | ICD-10-CM | POA: Insufficient documentation

## 2019-05-03 DIAGNOSIS — Z978 Presence of other specified devices: Secondary | ICD-10-CM | POA: Diagnosis present

## 2019-05-03 DIAGNOSIS — Z79891 Long term (current) use of opiate analgesic: Secondary | ICD-10-CM | POA: Insufficient documentation

## 2019-05-03 DIAGNOSIS — M5416 Radiculopathy, lumbar region: Secondary | ICD-10-CM | POA: Insufficient documentation

## 2019-05-03 DIAGNOSIS — G894 Chronic pain syndrome: Secondary | ICD-10-CM | POA: Diagnosis not present

## 2019-05-03 DIAGNOSIS — M79604 Pain in right leg: Secondary | ICD-10-CM | POA: Diagnosis present

## 2019-05-03 DIAGNOSIS — G8929 Other chronic pain: Secondary | ICD-10-CM | POA: Insufficient documentation

## 2019-05-03 DIAGNOSIS — M5441 Lumbago with sciatica, right side: Secondary | ICD-10-CM | POA: Insufficient documentation

## 2019-05-03 DIAGNOSIS — Z451 Encounter for adjustment and management of infusion pump: Secondary | ICD-10-CM | POA: Diagnosis present

## 2019-05-03 DIAGNOSIS — M961 Postlaminectomy syndrome, not elsewhere classified: Secondary | ICD-10-CM | POA: Insufficient documentation

## 2019-05-03 MED FILL — Medication: INTRATHECAL | Qty: 1 | Status: AC

## 2019-05-03 NOTE — Progress Notes (Signed)
Safety precautions to be maintained throughout the outpatient stay will include: orient to surroundings, keep bed in low position, maintain call bell within reach at all times, provide assistance with transfer out of bed and ambulation.  

## 2019-05-03 NOTE — Progress Notes (Signed)
PROVIDER NOTE: Information contained herein reflects review and annotations entered in association with encounter. Interpretation of such information and data should be left to medically-trained personnel. Information provided to patient can be located elsewhere in the medical record under "Patient Instructions". Document created using STT-dictation technology, any transcriptional errors that may result from process are unintentional.    Patient: Brian Spencer  Service Category: Procedure  Provider: Gaspar Cola, MD  DOB: [birth year]  DOS: 05/03/2019  Location: [facility]  MRN: [id number]  Setting: Ambulatory - outpatient  Referring Provider: No ref. provider found  Type: Established Patient  Specialty: Interventional Pain Management  PCP: System, Pcp Not In   Primary Reason for Visit: Interventional Pain Management Treatment. CC: Back Pain (lower)  Procedure:          Intrathecal Drug Delivery System (IDDS):  Type: Reservoir Refill [phone]) 20% rate decrease Region: Abdominal Laterality: Right  Type of Pump: Medtronic Synchromed II Delivery Route: Intrathecal Type of Pain Treated: Neuropathic/Nociceptive Primary Medication Class: Opioid/opiate  Medication, Concentration, Infusion Program, & Delivery Rate: Please see scanned programming printout.   Indications: 1. Chronic pain syndrome   2. Chronic low back pain (Primary Source of Pain) (Bilateral) (R>L)   3. Chronic lower extremity pain (Secondary source of pain) (Right)   4. 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 Failed back surgical syndrome   6. Presence of intrathecal pump   7. Encounter for adjustment or management of infusion pump   8. Long term current use of opiate analgesic    Pain Assessment: Self-Reported Pain Score: 3 /10             Reported level is compatible with observation.        Brian Spencer refers that this is the best he has felt in a long time.  This is likely due to the  addition of the bupivacaine.  Before having added bupivacaine we did run it by the patient and we explained to him the issues about it being an off label use of the medication and what that meant.  He understood and accepted.  The patient currently has PF-hydromorphone 20 mg/mL running at 3.001 mg/day; PF-bupivacaine 20 mg/mL running at 3.001 mg/day; and PF-clonidine 300 mcg/mL running at 45.01 mcg/day.  Since we have been wasting a rather large amount of medication on every refill, today we will be changing the concentrations to the following: PF-hydromorphone 10 mg/mL running at 2.399 mg/day; PF-bupivacaine 10 mg/mL running at 2.399 mg/day; and PF-clonidine 150 mcg/mL running at 35.99 mcg/day.  In according to the patient's wishes, this represents a 20% decrease in the amount of hydromorphone that he has been getting.  Today he has expressed his interest in switching primarily to the use of a local anesthetic and to minimize the use of the opioid and his pump.  This will result in a drop of the patient's MME from 12.0 mg/day to 9.6 mg/day.  For the purpose of his next refill, we plan to make the following changes: PF-hydromorphone 10 mg/mL, which we will run at 1.9 mg/day; PF-bupivacaine 20 mg/mL to run at 3.8384 mg/day; PF-clonidine 150 mcg/mL to run at about 28.792 mcg/day.  We will continue to run the pump based on the hydromorphone concentration, for the time being.  This new medication should represent a decrease in the hydromorphone rate of 20% and therefore a decrease in the dose of 20% however, we will be increasing the bupivacaine so asked to rely more  on the local anesthetic.  In the case of the bupivacaine this new infusion rate will actually represent a 60% increase in the dose.  Regarding the clonidine, just as in the case of the hydromorphone, it will be a 20% decrease.  Pharmacotherapy Assessment  Analgesic: Intrathecal Hydromorphone (Dilaudid) @ 2.399 mg/d. MME: 9.6 mg/day.    Monitoring: [place] PMP: PDMP reviewed during this encounter.       Pharmacotherapy: No side-effects or adverse reactions reported. Compliance: No problems identified. Effectiveness: Clinically acceptable. Plan: Refer to "POC".  UDS:  Summary  Date Value Ref Range Status  11/12/2015 FINAL  Final    Comment:    ==================================================================== [facility] COMP DRUG ANALYSIS,UR ==================================================================== Test                             Result       Flag       Units Drug Present and Declared for Prescription Verification   7-aminoclonazepam              82           EXPECTED   ng/mg creat    7-aminoclonazepam is an expected metabolite of clonazepam. Source    of clonazepam is a scheduled prescription medication.   Trazodone                      PRESENT      EXPECTED   1,3 chlorophenyl piperazine    PRESENT      EXPECTED    1,3-chlorophenyl piperazine is an expected metabolite of    trazodone.   Verapamil                      PRESENT      EXPECTED Drug Present not Declared for Prescription Verification   Hydromorphone                  1656         UNEXPECTED ng/mg creat    Hydromorphone may be administered as a scheduled prescription    medication; it is also an expected metabolite of hydrocodone. Drug Absent but Declared for Prescription Verification   Venlafaxine                    Not Detected UNEXPECTED ==================================================================== Test                      Result    Flag   Units      Ref Range   Creatinine              73               mg/dL      >=20 ==================================================================== Declared Medications:  The flagging and interpretation on this report are based on the  following declared medications.  Unexpected results may arise from  inaccuracies in the declared medications.  **Note: The testing scope of this panel includes  these medications:  Clonazepam (Klonopin)  Trazodone (Desyrel)  Venlafaxine (Effexor)  Verapamil (Calan)  **Note: The testing scope of this panel does not include following  reported medications:  Albuterol  Budenoside (Symbicort)  Chlorthalidone  Formoterol (Symbicort)  Multivitamin  Tamsulosin (Flomax)  Tiotropium (Spiriva)  Ubiquinone (Coenzyme Q 10)  Vitamin D2 (Ergocalciferol) ==================================================================== For clinical consultation, please call [phone]. ====================================================================    Intrathecal Pump Therapy Assessment  Manufacturer: Medtronic  Synchromed Type: Programmable Volume: 40 mL reservoir MRI compatibility: Yes   Drug content (Initial):  Primary Medication Class: Opioid Primary Medication: PF-Hydromorphone (Dilaudid) (20 mg/mL) Secondary Medication: PF-Bupivacaine (20 mg/mL) Other Medication: PF-Clonidine (300 g/mL)  Drug content (New):  Primary Medication Class: Opioid Primary Medication: PF-Hydromorphone (Dilaudid) (10 mg/mL) Secondary Medication: PF-Bupivacaine (10 mg/mL) Other Medication: PF-Clonidine (150 g/mL)  Programming:  Type: Simple continuous. See pump readout for details.   Changes:  Medication Change: Today we will be dropping the concentration on his medicines by 50%. Rate Change: Today we will be decreasing the patient's rate and therefore daily dose by 20%.  Reported side-effects or adverse reactions: None reported  Effectiveness: Described as relatively effective, allowing for increase in activities of daily living (ADL) Clinically meaningful improvement in function (CMIF): Sustained CMIF goals met  Plan: Pump refill today  Pre-op Assessment:  Mr. Scadden is a 69 y.o. (year old), male patient, seen today for interventional treatment. 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 Mr. Lineman has a current medication list which includes the following prescription(s): albuterol, AMBULATORY NON FORMULARY MEDICATION, amlodipine, apixaban, atorvastatin, buspirone, coenzyme T62, folic acid, furosemide, methotrexate, multivitamin, pantoprazole, propranolol, tamsulosin, tiotropium bromide-olodaterol, trazodone, venlafaxine xr, verapamil, and PAIN MANAGEMENT IT PUMP REFILL. His primarily concern today is the Back Pain (lower)  Initial Vital Signs:  Pulse/HCG Rate: (!) 56  Temp: 98.2 F (36.8 C) Resp: 18 BP: (!) 142/74 SpO2: 93 %  BMI: Estimated body mass index is 36.9 kg/m as calculated from the following:   Height as of this encounter: _0  (1.626 m).   Weight as of this encounter: 215 lb (97.5 kg).  Risk Assessment: Allergies: Reviewed. He is allergic to baclofen and hydralazine.  Allergy Precautions: None required Coagulopathies: Reviewed. None identified.  Blood-thinner therapy: None at this time Active Infection(s): Reviewed. None identified. Mr. Kriesel is afebrile  Site Confirmation: Mr. Hepp was asked to confirm the procedure and laterality before marking the site Procedure checklist: Completed Consent: Before the procedure and under the influence of no sedative(s), amnesic(s), or  anxiolytics, the patient was informed of the treatment options, risks and possible complications. To fulfill our ethical and legal obligations, as recommended by the American Medical Association's Code of Ethics, I have informed the patient of my clinical impression; the nature and purpose of the treatment or procedure; the risks, benefits, and possible complications of the intervention; the alternatives, including doing nothing; the risk(s) and benefit(s) of the alternative treatment(s) or procedure(s); and the risk(s) and benefit(s) of doing nothing.  Mr. Bastyr was provided with information about the general risks and possible complications associated with most interventional procedures. These include, but are not limited to: failure to achieve desired goals, infection, bleeding, organ or nerve damage, allergic reactions, paralysis, and/or death.  In addition, he was informed of those risks and possible complications associated to this particular procedure, which include, but are not limited to: damage to the implant; failure  to decrease pain; local, systemic, or serious CNS infections, intraspinal abscess with possible cord compression and paralysis, or life-threatening such as meningitis; bleeding; organ damage; nerve injury or damage with subsequent sensory, motor, and/or autonomic system dysfunction, resulting in transient or permanent pain, numbness, and/or weakness of one or several areas of the body; allergic reactions, either minor or major life-threatening, such as anaphylactic or anaphylactoid reactions.  Furthermore, Mr. Rueb was informed of those risks and complications associated with the medications. These include, but are not limited to: allergic reactions (i.e.: anaphylactic or anaphylactoid reactions); endorphine suppression; bradycardia and/or hypotension; water retention and/or peripheral vascular relaxation leading to lower extremity edema and possible stasis ulcers; respiratory  depression and/or shortness of breath; decreased metabolic rate leading to weight gain; swelling or edema; medication-induced neural toxicity; particulate matter embolism and blood vessel occlusion with resultant organ, and/or nervous system infarction; and/or intrathecal granuloma formation with possible spinal cord compression and permanent paralysis.  Before refilling the pump Mr. Dickerman was informed that some of the medications used in the devise may not be FDA approved for such use and therefore it constitutes an off-label use of the medications.  Finally, he was informed that Medicine is not an exact science; therefore, there is also the possibility of unforeseen or unpredictable risks and/or possible complications that may result in a catastrophic outcome. The patient indicated having understood very clearly. We have given the patient no guarantees and we have made no promises. Enough time was given to the patient to ask questions, all of which were answered to the patient's satisfaction. Mr. Sistrunk has indicated that he wanted to continue with the procedure. Attestation: I, the ordering provider, attest that I have discussed with the patient the benefits, risks, side-effects, alternatives, likelihood of achieving goals, and potential problems during recovery for the procedure that I have provided informed consent. Date  Time: 05/03/2019 10:45 AM  Pre-Procedure Preparation:  Monitoring: As per clinic protocol. Respiration, ETCO2, SpO2, BP, heart rate and rhythm monitor placed and checked for adequate function Safety Precautions: Patient was assessed for positional comfort and pressure points before starting the procedure. Time-out: I initiated and conducted the "Time-out" before starting the procedure, as per protocol. The patient was asked to participate by confirming the accuracy of the "Time Out" information. Verification of the correct person, site, and procedure were performed and confirmed by  me, the nursing staff, and the patient. "Time-out" conducted as per Joint Commission's Universal Protocol (UP.01.01.01). Time: 1112  Description of Procedure:          Position: Supine Target Area: Central-port of intrathecal pump. Approach: Anterior, 90 degree angle approach. Area Prepped: Entire Area around the pump implant. Prepping solution: DuraPrep (Iodine Povacrylex [0.7% available iodine] and Isopropyl Alcohol, 74% w/w) Safety Precautions: Aspiration looking for blood return was conducted prior to all injections. At no point did we inject any substances, as a needle was being advanced. No attempts were made at seeking any paresthesias. Safe injection practices and needle disposal techniques used. Medications properly checked for expiration dates. SDV (single dose vial) medications used. Description of the Procedure: Protocol guidelines were followed. Two nurses trained to do implant refills were present during the entire procedure. The refill medication was checked by both healthcare providers as well as the patient. The patient was included in the "Time-out" to verify the medication. The patient was placed in position. The pump was identified. The area was prepped in the usual manner. The sterile template was positioned over the pump, making  sure the side-port location matched that of the pump. Both, the pump and the template were held for stability. The needle provided in the Medtronic Kit was then introduced thru the center of the template and into the central port. The pump content was aspirated and discarded volume documented. The new medication was slowly infused into the pump, thru the filter, making sure to avoid overpressure of the device. The needle was then removed and the area cleansed, making sure to leave some of the prepping solution back to take advantage of its long term bactericidal properties. The pump was interrogated and programmed to reflect the correct medication, volume, and  dosage. The program was printed and taken to the physician for approval. Once checked and signed by the physician, a copy was provided to the patient and another scanned into the EMR. Vitals:   05/03/19 1044  BP: (!) 142/74  Pulse: (!) 56  Resp: 18  Temp: 98.2 F (36.8 C)  TempSrc: Temporal  SpO2: 93%  Weight: 215 lb (97.5 kg)  Height: '5\' 4"'$  (1.626 m)    Start Time: 1115 hrs. End Time: 1121 hrs. Materials & Medications: Medtronic Refill Kit Medication(s): Please see chart orders for details.  Imaging Guidance:          Type of Imaging Technique: None used Indication(s): N/A Exposure Time: No patient exposure Contrast: None used. Fluoroscopic Guidance: N/A Ultrasound Guidance: N/A Interpretation: N/A  Antibiotic Prophylaxis:   Anti-infectives (From admission, onward)   None     Indication(s): None identified  Post-operative Assessment:  Post-procedure Vital Signs:  Pulse/HCG Rate: (!) 56  Temp: 98.2 F (36.8 C) Resp: 18 BP: (!) 142/74 SpO2: 93 %  EBL: None  Complications: No immediate post-treatment complications observed by team, or reported by patient.  Note: The patient tolerated the entire procedure well. A repeat set of vitals were taken after the procedure and the patient was kept under observation following institutional policy, for this type of procedure. Post-procedural neurological assessment was performed, showing return to baseline, prior to discharge. The patient was provided with post-procedure discharge instructions, including a section on how to identify potential problems. Should any problems arise concerning this procedure, the patient was given instructions to immediately contact us, at any time, without hesitation. In any case, we plan to contact the patient by telephone for a follow-up status report regarding this interventional procedure.  Comments:  No additional relevant information.  Plan of Care  Orders:  Orders Placed This Encounter   Procedures  . PUMP REFILL    Maintain Protocol by having two(2) healthcare providers during procedure and programming.    Scheduling Instructions:     Please refill intrathecal pump today.    Order Specific Question:   Where will this procedure be performed?    Answer:   [facility]  . PUMP REFILL    Whenever possible schedule on a procedure today.    Standing Status:   Future    Standing Expiration Date:   09/30/2019    Scheduling Instructions:     Please schedule intrathecal pump refill based on pump programming. Avoid schedule intervals of more than 120 days (4 months).    Order Specific Question:   Where will this procedure be performed?    Answer:   [facility]  . Informed Consent Details: Physician/Practitioner Attestation; Transcribe to consent form and obtain patient signature    Provider Attestation: I, Grinnell Dossie Arbour, MD, (Pain Management Specialist), the physician/practitioner, attest that I have  discussed with the patient the benefits, risks, side effects, alternatives, likelihood of achieving goals and potential problems during recovery for the procedure that I have provided informed consent.    Scheduling Instructions:     Procedure: Intrathecal Pump Refill     Attending Physician: Beatriz Chancellor A. Dossie Arbour, MD     Indications: Chronic Pain Syndrome (G89.4)     Transcribe to consent form and obtain patient signature.   Chronic Opioid Analgesic:  Intrathecal Hydromorphone (Dilaudid) @ 2.399 mg/d. MME: 9.6 mg/day.   Medications ordered for procedure: No orders of the defined types were placed in this encounter.  Medications administered: Kinte Trim had no medications administered during this visit.  See the medical record for exact dosing, route, and time of administration.  Follow-up plan:   Return for Pump Refill (Max:45mo.       Interventional management options: Planned, scheduled, and/or pending:   The patient has expressed his desire to  continue going down on the medications that he is receiving through the intrathecal pump.  Today we had an extensive conversation about this and we have decided to take the following approach. The plan is to change the concentration of the next refill to the following: PF-Hydromorphone: 10 mg/mL (primary)  PF-Bupivacaine: 20 mg/mL  PF-Clonidine: 150 mcg/mL   We will run this at a rate of aprox. 2.4 mg/day of hydromorphone (20% decrease)   Considering:   NOTE: ELIQUIS ANTICOAGULATION (Stop: 3days  Re-start: 6 hrs) Diagnostic bilateral lumbar facet blocks #1    Palliative PRN treatment(s):   Continue management of intrathecal pump      Recent Visits No visits were found meeting these conditions.  Showing recent visits within past 90 days and meeting all other requirements   Today's Visits Date Type Provider Dept  05/03/19 Procedure visit NMilinda Pointer MD [facility]  Showing today's visits and meeting all other requirements   Future Appointments No visits were found meeting these conditions.  Showing future appointments within next 90 days and meeting all other requirements   Disposition: Discharge home  Discharge (Date  Time): 05/03/2019; 1145 hrs.   Primary Care Physician: System, Pcp Not In [facility] Note by: FGaspar Cola MD Date: 05/03/2019; Time: 12:17 PM  Disclaimer:  Medicine is not an exact science. The only guarantee in medicine is that nothing is guaranteed. It is important to note that the decision to proceed with this intervention was based on the information collected from the patient. The Data and conclusions were drawn from the patient's questionnaire, the interview, and the physical examination. Because the information was provided in large part by the patient, it cannot be guaranteed that it has not been purposely or unconsciously manipulated. Every effort has been made to obtain as much relevant data  as possible for this evaluation. It is important to note that the conclusions that lead to this procedure are derived in large part from the available data. Always take into account that the treatment will also be dependent on availability of resources and existing treatment guidelines, considered by other Pain Management Practitioners as being common knowledge and practice, at the time of the intervention. For Medico-Legal purposes, it is also important to point out that variation in procedural techniques and pharmacological choices are the acceptable norm. The indications, contraindications, technique, and results of the above procedure should only be interpreted and judged by a Board-Certified Interventional Pain Specialist with extensive familiarity and expertise in the same exact procedure and technique.

## 2019-05-04 ENCOUNTER — Telehealth: Payer: Self-pay

## 2019-05-04 ENCOUNTER — Ambulatory Visit: Payer: Worker's Compensation | Admitting: Pain Medicine

## 2019-05-04 NOTE — Telephone Encounter (Signed)
Post procedure phone call.  Patient states he is doing good.  

## 2019-08-02 ENCOUNTER — Other Ambulatory Visit: Payer: Self-pay

## 2019-08-02 MED ORDER — PAIN MANAGEMENT IT PUMP REFILL: 1.0000 | Freq: Once | INTRATHECAL | 0 refills | Status: AC

## 2019-08-11 ENCOUNTER — Other Ambulatory Visit: Payer: Self-pay

## 2019-08-11 ENCOUNTER — Encounter: Payer: Self-pay | Admitting: Pain Medicine

## 2019-08-11 ENCOUNTER — Ambulatory Visit: Payer: Worker's Compensation | Attending: Pain Medicine | Admitting: Pain Medicine

## 2019-08-11 VITALS — BP 145/101 | HR 65 | Temp 97.2°F | Resp 18 | Ht 65.0 in | Wt 230.0 lb

## 2019-08-11 DIAGNOSIS — Z888 Allergy status to other drugs, medicaments and biological substances status: Secondary | ICD-10-CM | POA: Diagnosis not present

## 2019-08-11 DIAGNOSIS — Z7901 Long term (current) use of anticoagulants: Secondary | ICD-10-CM | POA: Diagnosis not present

## 2019-08-11 DIAGNOSIS — M961 Postlaminectomy syndrome, not elsewhere classified: Secondary | ICD-10-CM | POA: Diagnosis not present

## 2019-08-11 DIAGNOSIS — M899 Disorder of bone, unspecified: Secondary | ICD-10-CM | POA: Insufficient documentation

## 2019-08-11 DIAGNOSIS — Z79891 Long term (current) use of opiate analgesic: Secondary | ICD-10-CM | POA: Diagnosis not present

## 2019-08-11 DIAGNOSIS — M5137 Other intervertebral disc degeneration, lumbosacral region: Secondary | ICD-10-CM | POA: Diagnosis not present

## 2019-08-11 DIAGNOSIS — J449 Chronic obstructive pulmonary disease, unspecified: Secondary | ICD-10-CM | POA: Diagnosis not present

## 2019-08-11 DIAGNOSIS — G8929 Other chronic pain: Secondary | ICD-10-CM

## 2019-08-11 DIAGNOSIS — Z789 Other specified health status: Secondary | ICD-10-CM | POA: Insufficient documentation

## 2019-08-11 DIAGNOSIS — Z451 Encounter for adjustment and management of infusion pump: Secondary | ICD-10-CM | POA: Insufficient documentation

## 2019-08-11 DIAGNOSIS — G894 Chronic pain syndrome: Secondary | ICD-10-CM | POA: Diagnosis not present

## 2019-08-11 DIAGNOSIS — Z79899 Other long term (current) drug therapy: Secondary | ICD-10-CM | POA: Insufficient documentation

## 2019-08-11 DIAGNOSIS — Z978 Presence of other specified devices: Secondary | ICD-10-CM

## 2019-08-11 DIAGNOSIS — M79604 Pain in right leg: Secondary | ICD-10-CM | POA: Insufficient documentation

## 2019-08-11 DIAGNOSIS — R0902 Hypoxemia: Secondary | ICD-10-CM | POA: Diagnosis not present

## 2019-08-11 MED FILL — Medication: INTRATHECAL | Qty: 1 | Status: AC

## 2019-08-11 NOTE — Progress Notes (Signed)
Safety precautions to be maintained throughout the outpatient stay will include: orient to surroundings, keep bed in low position, maintain call bell within reach at all times, provide assistance with transfer out of bed and ambulation.  

## 2019-08-11 NOTE — Progress Notes (Signed)
PROVIDER NOTE: Information contained herein reflects review and annotations entered in association with encounter. Interpretation of such information and data should be left to medically-trained personnel. Information provided to patient can be located elsewhere in the medical record under "Patient Instructions". Document created using STT-dictation technology, any transcriptional errors that may result from process are unintentional.    Patient: Brian Spencer  Service Category: Procedure  Provider: Gaspar Cola, MD  DOB: Jul 07, 1950  DOS: 08/11/2019  Location: [facility]  MRN: [id number]  Setting: Ambulatory - outpatient  Referring Provider: No ref. provider found  Type: Established Patient  Specialty: Interventional Pain Management  PCP: System, Pcp Not In   Primary Reason for Visit: Interventional Pain Management Treatment. CC: Back Pain  Procedure:          Intrathecal Drug Delivery System (IDDS):  Type: Reservoir Refill [phone]) 20% rate decrease Region: Abdominal Laterality: Right  Type of Pump: Medtronic Synchromed II Delivery Route: Intrathecal Type of Pain Treated: Neuropathic/Nociceptive Primary Medication Class: Opioid/opiate  Medication, Concentration, Infusion Program, & Delivery Rate: Please see scanned programming printout.   Indications: 1. Chronic pain syndrome   2. Failed back surgical syndrome   3. Chronic low back pain (Primary Source of Pain) (Bilateral) (R>L)   4. Chronic lower extremity pain (Secondary source of pain) (Right)   5. DDD (degenerative disc disease), lumbosacral   6. Presence of intrathecal pump   7. Encounter for adjustment or management of infusion pump   8. Chronic anticoagulation (Eliquis)   9. 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        Note: Today I had a conversation with the patient regarding his  reasons for continuing to go down on his pump.  He indicated to me that soon he will be time for him to replace the pump due to its battery life coming to an end.  He apparently wants to know how we would feel without him having any of that medicine.  The reason behind that is that he is interested and deciding whether or not to have it replaced or simply taken out.  However, lately he has been getting quite a bit of benefit from the local anesthetic in the pump and he is open to the possibility of simply eliminating the other medicines and keeping just the bupivacaine.  I asked him if he had any specific reasons for this and he indicated that he suffers from anxiety and at the [place] they do not want to prescribe any type of benzodiazepine while he is on a narcotic analgesic.  He indicates that he rather be on Klonopin then to be taking pain medicine and not being able to take anything for the anxiety.  Today I informed the patient that although I understand the reason why they are reluctant to prescribe the benzodiazepines while someone is on opioid analgesics, I also understand that their [place] physicians may be misinterpreting this CDC recommendations.  First of all this recommendations to avoid mixing benzodiazepines and opioid analgesics if it is more relevant to the use of those medications orally and concomitantly.  However, because the total amount of opioids that he receives intrathecally is minuscule, I really do not see any major contraindication to putting him back on the Klonopin.  It is very unlikely that the 2 would interact at such  doses.  In addition to that, he had to keep in mind that anybody that has been on opioids and benzodiazepines for a long time, have probably developed tolerance to both of them and most studies would suggest that under this type of scenario the possibility of drug to drug interaction and respiratory depression as a consequence of that is rather low.  In any case, it is up to  them to decide whether or not they need to prescribe the Klonopin since I will not be prescribing any.  Pharmacotherapy Assessment  Analgesic: Intrathecal Hydromorphone (Dilaudid) @ 2.399 mg/d. MME: 9.6 mg/day.   Monitoring: [place] PMP: PDMP reviewed during this encounter.       Pharmacotherapy: No side-effects or adverse reactions reported. Compliance: No problems identified. Effectiveness: Clinically acceptable. Plan: Refer to "POC".  UDS:  Summary  Date Value Ref Range Status  11/12/2015 FINAL  Final    Comment:    ==================================================================== TOXASSURE COMP DRUG ANALYSIS,UR ==================================================================== Test                             Result       Flag       Units Drug Present and Declared for Prescription Verification   7-aminoclonazepam              82           EXPECTED   ng/mg creat    7-aminoclonazepam is an expected metabolite of clonazepam. Source    of clonazepam is a scheduled prescription medication.   Trazodone                      PRESENT      EXPECTED   1,3 chlorophenyl piperazine    PRESENT      EXPECTED    1,3-chlorophenyl piperazine is an expected metabolite of    trazodone.   Verapamil                      PRESENT      EXPECTED Drug Present not Declared for Prescription Verification   Hydromorphone                  1656         UNEXPECTED ng/mg creat    Hydromorphone may be administered as a scheduled prescription    medication; it is also an expected metabolite of hydrocodone. Drug Absent but Declared for Prescription Verification   Venlafaxine                    Not Detected UNEXPECTED ==================================================================== Test                      Result    Flag   Units      Ref Range   Creatinine              73               mg/dL      >=20 ==================================================================== Declared Medications:  The flagging  and interpretation on this report are based on the  following declared medications.  Unexpected results may arise from  inaccuracies in the declared medications.  **Note: The testing scope of this panel includes these medications:  Clonazepam (Klonopin)  Trazodone (Desyrel)  Venlafaxine (Effexor)  Verapamil (Calan)  **Note: The testing scope of this panel does not include following  reported  medications:  Albuterol  Budenoside (Symbicort)  Chlorthalidone  Formoterol (Symbicort)  Multivitamin  Tamsulosin (Flomax)  Tiotropium (Spiriva)  Ubiquinone (Coenzyme Q 10)  Vitamin D2 (Ergocalciferol) ==================================================================== For clinical consultation, please call [phone]. ====================================================================     Intrathecal Pump Therapy Assessment  Manufacturer: Medtronic Synchromed Type: Programmable Volume: 40 mL reservoir MRI compatibility: Yes   Drug content (Initial):  Primary Medication Class:Opioid Primary Medication:PF-Hydromorphone (Dilaudid)(10 mg/mL) Secondary Medication:PF-Bupivacaine(10 mg/mL) Other Medication:PF-Clonidine(150 g/mL)  Drug content (New):  Primary Medication Class:Opioid Primary Medication:PF-Hydromorphone (Dilaudid)(10 mg/mL) Secondary Medication:PF-Bupivacaine(20 mg/mL) Other Medication:PF-Clonidine(150 g/mL)   Programming:  Type: Simple continuous. See pump readout for details.   Changes:  Medication Change: None at this point Rate Change: No change in rate  Reported side-effects or adverse reactions: None reported  Effectiveness: Described as relatively effective, allowing for increase in activities of daily living (ADL) Clinically meaningful improvement in function (CMIF): Sustained CMIF goals met  Plan: Pump refill today  Pre-op Assessment:  Mr. Hanshaw is a 69 y.o. (year old), male patient, seen today for interventional treatment. 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 Mr. Herbers has a current medication list which includes the following prescription(s): albuterol, AMBULATORY NON FORMULARY MEDICATION, amlodipine, apixaban, atorvastatin, buspirone, coenzyme B70, folic acid, furosemide, methotrexate, multivitamin, NON FORMULARY, pantoprazole, propranolol, tamsulosin, tiotropium bromide-olodaterol, trazodone, venlafaxine xr, verapamil, and PAIN MANAGEMENT IT PUMP REFILL. His primarily concern today is the Back Pain  Initial Vital Signs:  Pulse/HCG Rate: 65  Temp: (!) 97.2 F (36.2 C) Resp: 18 BP: (!) 145/101 SpO2: 97 %  BMI: Estimated body mass index is 38.27 kg/m as calculated from the following:   Height as of this encounter: 5' 5"  (1.651 m).   Weight as of this encounter: 230 lb (104.3 kg).  Risk Assessment: Allergies: Reviewed. He is allergic to baclofen and hydralazine.  Allergy Precautions: None required Coagulopathies: Reviewed. None identified.  Blood-thinner therapy: None at this time Active Infection(s): Reviewed. None identified. Mr. Dhondt is afebrile  Site Confirmation: Mr. Hanton was asked to confirm the procedure and laterality before marking the  site Procedure checklist: Completed Consent: Before the procedure and under the influence of no sedative(s), amnesic(s), or anxiolytics, the patient was informed of the treatment options, risks and possible complications. To fulfill our ethical and legal obligations, as recommended by the American Medical Association's Code of Ethics, I have informed the patient of my clinical impression; the nature and purpose of the treatment or procedure; the risks, benefits, and possible complications of the intervention; the alternatives, including doing nothing; the risk(s) and benefit(s) of the alternative treatment(s) or procedure(s); and the risk(s) and benefit(s) of doing nothing.  Mr. Bedrosian was provided with information about the general risks and possible complications associated with most interventional procedures. These include, but are not limited to: failure to achieve desired goals, infection, bleeding, organ or nerve damage, allergic reactions, paralysis, and/or death.  In addition, he was informed of those risks and possible  complications associated to this particular procedure, which include, but are not limited to: damage to the implant; failure to decrease pain; local, systemic, or serious CNS infections, intraspinal abscess with possible cord compression and paralysis, or life-threatening such as meningitis; bleeding; organ damage; nerve injury or damage with subsequent sensory, motor, and/or autonomic system dysfunction, resulting in transient or permanent pain, numbness, and/or weakness of one or several areas of the body; allergic reactions, either minor or major life-threatening, such as anaphylactic or anaphylactoid reactions.  Furthermore, Mr. Banka was informed of those risks and complications associated with the medications. These include, but are not limited to: allergic reactions (i.e.: anaphylactic or anaphylactoid reactions); endorphine suppression; bradycardia and/or hypotension; water  retention and/or peripheral vascular relaxation leading to lower extremity edema and possible stasis ulcers; respiratory depression and/or shortness of breath; decreased metabolic rate leading to weight gain; swelling or edema; medication-induced neural toxicity; particulate matter embolism and blood vessel occlusion with resultant organ, and/or nervous system infarction; and/or intrathecal granuloma formation with possible spinal cord compression and permanent paralysis.  Before refilling the pump Mr. Lindeman was informed that some of the medications used in the devise may not be FDA approved for such use and therefore it constitutes an off-label use of the medications.  Finally, he was informed that Medicine is not an exact science; therefore, there is also the possibility of unforeseen or unpredictable risks and/or possible complications that may result in a catastrophic outcome. The patient indicated having understood very clearly. We have given the patient no guarantees and we have made no promises. Enough time was given to the patient to ask questions, all of which were answered to the patient's satisfaction. Mr. Turman has indicated that he wanted to continue with the procedure. Attestation: I, the ordering provider, attest that I have discussed with the patient the benefits, risks, side-effects, alternatives, likelihood of achieving goals, and potential problems during recovery for the procedure that I have provided informed consent. Date  Time: 08/11/2019 10:42 AM  Pre-Procedure Preparation:  Monitoring: As per clinic protocol. Respiration, ETCO2, SpO2, BP, heart rate and rhythm monitor placed and checked for adequate function Safety Precautions: Patient was assessed for positional comfort and pressure points before starting the procedure. Time-out: I initiated and conducted the "Time-out" before starting the procedure, as per protocol. The patient was asked to participate by confirming the accuracy  of the "Time Out" information. Verification of the correct person, site, and procedure were performed and confirmed by me, the nursing staff, and the patient. "Time-out" conducted as per Joint Commission's Universal Protocol (UP.01.01.01). Time: 1140  Description of Procedure:          Position: Supine Target Area: Central-port of intrathecal pump. Approach: Anterior, 90 degree angle approach. Area Prepped: Entire Area around the pump implant. DuraPrep (Iodine Povacrylex [0.7% available iodine] and Isopropyl Alcohol, 74% w/w) Safety Precautions: Aspiration looking for blood return was conducted prior to all injections. At no point did we inject any substances, as a needle was being advanced. No attempts were made at seeking any paresthesias. Safe injection practices and needle disposal techniques used. Medications properly checked for expiration dates. SDV (single dose vial) medications used. Description of the Procedure: Protocol guidelines were followed. Two nurses trained to do implant refills were present during the entire procedure. The refill medication was checked by both healthcare providers as well as the patient. The patient was included in the "Time-out" to verify the medication. The patient was placed in position. The pump was identified. The  area was prepped in the usual manner. The sterile template was positioned over the pump, making sure the side-port location matched that of the pump. Both, the pump and the template were held for stability. The needle provided in the Medtronic Kit was then introduced thru the center of the template and into the central port. The pump content was aspirated and discarded volume documented. The new medication was slowly infused into the pump, thru the filter, making sure to avoid overpressure of the device. The needle was then removed and the area cleansed, making sure to leave some of the prepping solution back to take advantage of its long term bactericidal  properties. The pump was interrogated and programmed to reflect the correct medication, volume, and dosage. The program was printed and taken to the physician for approval. Once checked and signed by the physician, a copy was provided to the patient and another scanned into the EMR. Vitals:   08/11/19 1034  BP: (!) 145/101  Pulse: 65  Resp: 18  Temp: (!) 97.2 F (36.2 C)  SpO2: 97%  Weight: 230 lb (104.3 kg)  Height: 5' 5"  (1.651 m)    Start Time: 1140 hrs. End Time: 1200 hrs. Materials & Medications: Medtronic Refill Kit Medication(s): Please see chart orders for details.  Imaging Guidance:          Type of Imaging Technique: None used Indication(s): N/A Exposure Time: No patient exposure Contrast: None used. Fluoroscopic Guidance: N/A Ultrasound Guidance: N/A Interpretation: N/A  Antibiotic Prophylaxis:   Anti-infectives (From admission, onward)   None     Indication(s): None identified  Post-operative Assessment:  Post-procedure Vital Signs:  Pulse/HCG Rate: 65  Temp: (!) 97.2 F (36.2 C) Resp: 18 BP: (!) 145/101 SpO2: 97 %  EBL: None  Complications: No immediate post-treatment complications observed by team, or reported by patient.  Note: The patient tolerated the entire procedure well. A repeat set of vitals were taken after the procedure and the patient was kept under observation following institutional policy, for this type of procedure. Post-procedural neurological assessment was performed, showing return to baseline, prior to discharge. The patient was provided with post-procedure discharge instructions, including a section on how to identify potential problems. Should any problems arise concerning this procedure, the patient was given instructions to immediately contact us, at any time, without hesitation. In any case, we plan to contact the patient by telephone for a follow-up status report regarding this interventional procedure.  Comments:  No additional  relevant information.  Plan of Care  Orders:  Orders Placed This Encounter  Procedures  . PUMP REFILL    Maintain Protocol by having two(2) healthcare providers during procedure and programming.    Scheduling Instructions:     Please refill intrathecal pump today.    Order Specific Question:   Where will this procedure be performed?    Answer:   [facility]  . PUMP REFILL    Whenever possible schedule on a procedure today.    Standing Status:   Future    Standing Expiration Date:   01/08/2020    Scheduling Instructions:     Please schedule intrathecal pump refill based on pump programming. Avoid schedule intervals of more than 120 days (4 months).    Order Specific Question:   Where will this procedure be performed?    Answer:   [facility]  . PUMP REPROGRAM    Follow programming protocol by having two(2) healthcare providers present during programming.  Scheduling Instructions:     Please perform the following adjustment: Decrease rate by 20%.    Order Specific Question:   Where will this procedure be performed?    Answer:   [facility]  . ToxASSURE Select 13 (MW), Urine    Volume: 30 ml(s). Minimum 3 ml of urine is needed. Document temperature of fresh sample. Indications: Long term (current) use of opiate analgesic (T51.761)    Order Specific Question:   Release to patient    Answer:   Immediate  . Informed Consent Details: Physician/Practitioner Attestation; Transcribe to consent form and obtain patient signature    Provider Attestation: I, [place] Dossie Arbour, MD, (Pain Management Specialist), the physician/practitioner, attest that I have discussed with the patient the benefits, risks, side effects, alternatives, likelihood of achieving goals and potential problems during recovery for the procedure that I have provided informed consent.    Scheduling Instructions:     Procedure: Intrathecal Pump Refill     Attending Physician: Beatriz Chancellor A.  Dossie Arbour, MD     Indications: Chronic Pain Syndrome (G89.4)     Transcribe to consent form and obtain patient signature.   Chronic Opioid Analgesic:  Intrathecal Hydromorphone (Dilaudid) @ 2.399 mg/d. MME: 9.6 mg/day.   Medications ordered for procedure: No orders of the defined types were placed in this encounter.  Medications administered: Ladarien Beeks had no medications administered during this visit.  See the medical record for exact dosing, route, and time of administration.  Follow-up plan:   Return for Pump Refill (Max:28mo.       Interventional management options: Planned, scheduled, and/or pending:   The patient has expressed his desire to continue going down on the medications that he is receiving through the intrathecal pump.  Today we had an extensive conversation about this and we have decided to take the following approach. The plan is to change the concentration of the next refill to the following: PF-Hydromorphone: 10 mg/mL (primary)  PF-Bupivacaine: 20 mg/mL  PF-Clonidine: 150 mcg/mL   We will run this at a rate of aprox. 2.4 mg/day of hydromorphone (20% decrease)   Considering:   NOTE: ELIQUIS ANTICOAGULATION (Stop: 3days  Re-start: 6 hrs) Diagnostic bilateral lumbar facet blocks #1    Palliative PRN treatment(s):   Continue management of intrathecal pump       Recent Visits No visits were found meeting these conditions. Showing recent visits within past 90 days and meeting all other requirements Today's Visits Date Type Provider Dept  08/11/19 Procedure visit NMilinda Pointer MD [facility]  Showing today's visits and meeting all other requirements Future Appointments No visits were found meeting these conditions. Showing future appointments within next 90 days and meeting all other requirements  Disposition: Discharge home  Discharge (Date  Time): 08/11/2019; 1154 hrs.   Primary Care Physician: System, Pcp Not In [facility] Note by: FGaspar Cola MD Date: 08/11/2019; Time: 12:53 PM  Disclaimer:  Medicine is not an eChief Strategy Officer The only guarantee in medicine is that nothing is guaranteed. It is important to note that the decision to proceed with this intervention was based on the information collected from the patient. The Data and conclusions were drawn from the patient's questionnaire, the interview, and the physical examination. Because the information was provided in large part by the patient, it cannot be guaranteed that it has not been purposely or unconsciously manipulated. Every effort has been made to obtain as much relevant data as possible  for this evaluation. It is important to note that the conclusions that lead to this procedure are derived in large part from the available data. Always take into account that the treatment will also be dependent on availability of resources and existing treatment guidelines, considered by other Pain Management Practitioners as being common knowledge and practice, at the time of the intervention. For Medico-Legal purposes, it is also important to point out that variation in procedural techniques and pharmacological choices are the acceptable norm. The indications, contraindications, technique, and results of the above procedure should only be interpreted and judged by a Board-Certified Interventional Pain Specialist with extensive familiarity and expertise in the same exact procedure and technique.

## 2019-08-12 ENCOUNTER — Telehealth: Payer: Self-pay | Admitting: *Deleted

## 2019-08-12 NOTE — Telephone Encounter (Signed)
Spoke with patient re; pump refill, no questions or concerns.

## 2019-09-27 ENCOUNTER — Encounter: Payer: Self-pay | Admitting: Pain Medicine

## 2019-10-31 ENCOUNTER — Other Ambulatory Visit: Payer: Self-pay

## 2019-10-31 MED ORDER — PAIN MANAGEMENT IT PUMP REFILL: 1.0000 | Freq: Once | INTRATHECAL | 0 refills | Status: AC

## 2019-11-17 ENCOUNTER — Ambulatory Visit: Payer: Worker's Compensation | Admitting: Pain Medicine

## 2019-11-22 ENCOUNTER — Other Ambulatory Visit: Payer: Self-pay

## 2019-11-22 ENCOUNTER — Encounter: Payer: Self-pay | Admitting: Pain Medicine

## 2019-11-22 ENCOUNTER — Ambulatory Visit: Payer: Worker's Compensation | Attending: Pain Medicine | Admitting: Pain Medicine

## 2019-11-22 VITALS — BP 124/56 | HR 68 | Temp 97.3°F | Ht 65.0 in | Wt 233.0 lb

## 2019-11-22 DIAGNOSIS — Z451 Encounter for adjustment and management of infusion pump: Secondary | ICD-10-CM | POA: Insufficient documentation

## 2019-11-22 DIAGNOSIS — M5137 Other intervertebral disc degeneration, lumbosacral region: Secondary | ICD-10-CM | POA: Diagnosis present

## 2019-11-22 DIAGNOSIS — Z978 Presence of other specified devices: Secondary | ICD-10-CM | POA: Insufficient documentation

## 2019-11-22 DIAGNOSIS — G894 Chronic pain syndrome: Secondary | ICD-10-CM | POA: Diagnosis present

## 2019-11-22 DIAGNOSIS — M961 Postlaminectomy syndrome, not elsewhere classified: Secondary | ICD-10-CM | POA: Insufficient documentation

## 2019-11-22 MED ORDER — PAIN MANAGEMENT IT PUMP REFILL: 1.0000 | Freq: Once | INTRATHECAL | 0 refills | Status: AC

## 2019-11-22 NOTE — Progress Notes (Signed)
Safety precautions to be maintained throughout the outpatient stay will include: orient to surroundings, keep bed in low position, maintain call bell within reach at all times, provide assistance with transfer out of bed and ambulation.  

## 2019-11-22 NOTE — Progress Notes (Signed)
PROVIDER NOTE: Information contained herein reflects review and annotations entered in association with encounter. Interpretation of such information and data should be left to medically-trained personnel. Information provided to patient can be located elsewhere in the medical record under "Patient Instructions". Document created using STT-dictation technology, any transcriptional errors that may result from process are unintentional.    Patient: Brian Spencer  Service Category: Procedure  Provider: Gaspar Cola, MD  DOB: 01/12/1951  DOS: 11/22/2019  Location: [facility]  MRN: [id number]  Setting: Ambulatory - outpatient  Referring Provider: No ref. provider found  Type: Established Patient  Specialty: Interventional Pain Management  Brian Spencer: Brian Spencer, Brian Spencer   Primary Reason for Visit: Interventional Pain Management Treatment. CC: Back Pain  Procedure:          Intrathecal Drug Delivery System (IDDS):  Type: Reservoir Refill [phone]) 20% rate decrease Region: Abdominal Laterality: Right  Type of Pump: Medtronic Synchromed II Delivery Route: Intrathecal Type of Pain Treated: Neuropathic/Nociceptive Primary Medication Class: Opioid/opiate  Medication, Concentration, Infusion Program, & Delivery Rate: Please see scanned programming printout.   Indications: 1. Chronic pain syndrome   2. Failed back surgical syndrome   3. DDD (degenerative disc disease), lumbosacral   4. Presence of intrathecal pump   5. Encounter for adjustment or management of infusion pump    Pain Assessment: Self-Reported Pain Score: 4 /10             Reported level is compatible with observation.        The patient returns today after having had a 20% decrease in his intrathecal pump rate (patient requested).  The patient has expressed his desire to completely come off of the narcotics.  He still wants to keep the pump for the delivery of the local anesthetic.  Today we have decreased the pump rate by 20%, but we  have noticed a residual of 20 mL.  Because of this, on the next refill I will be changing his concentration to approximately half on everything except for the opioid which we will cut it down further.  Pharmacotherapy Assessment  Analgesic: Intrathecal Hydromorphone (Dilaudid) @ 2.399 mg/d. MME: 9.6 mg/day.   Monitoring: [place] PMP: PDMP reviewed during this encounter.       Pharmacotherapy: Brian Spencer side-effects or adverse reactions reported. Compliance: Brian Spencer problems identified. Effectiveness: Clinically acceptable. Plan: Refer to "POC".  UDS:  Summary  Date Value Ref Range Status  11/12/2015 FINAL  Final    Comment:    ==================================================================== [facility] COMP DRUG ANALYSIS,UR ==================================================================== Test                             Result       Flag       Units Drug Present and Declared for Prescription Verification   7-aminoclonazepam              82           EXPECTED   ng/mg creat    7-aminoclonazepam is an expected metabolite of clonazepam. Source    of clonazepam is a scheduled prescription medication.   Trazodone                      PRESENT      EXPECTED   1,3 chlorophenyl piperazine    PRESENT      EXPECTED    1,3-chlorophenyl piperazine is an expected metabolite of    trazodone.   Verapamil  PRESENT      EXPECTED Drug Present not Declared for Prescription Verification   Hydromorphone                  1656         UNEXPECTED ng/mg creat    Hydromorphone may be administered as a scheduled prescription    medication; it is also an expected metabolite of hydrocodone. Drug Absent but Declared for Prescription Verification   Venlafaxine                    Not Detected UNEXPECTED ==================================================================== Test                      Result    Flag   Units      Ref Range   Creatinine              73               mg/dL       >=20 ==================================================================== Declared Medications:  The flagging and interpretation on this report are based on the  following declared medications.  Unexpected results may arise from  inaccuracies in the declared medications.  **Note: The testing scope of this panel includes these medications:  Clonazepam (Klonopin)  Trazodone (Desyrel)  Venlafaxine (Effexor)  Verapamil (Calan)  **Note: The testing scope of this panel does not include following  reported medications:  Albuterol  Budenoside (Symbicort)  Chlorthalidone  Formoterol (Symbicort)  Multivitamin  Tamsulosin (Flomax)  Tiotropium (Spiriva)  Ubiquinone (Coenzyme Q 10)  Vitamin D2 (Ergocalciferol) ==================================================================== For clinical consultation, please call [phone]. ====================================================================     Intrathecal Pump Therapy Assessment  Manufacturer: Medtronic Synchromed Type: Programmable Volume: 40 mL reservoir MRI compatibility: Yes   Drug content:  Primary Medication Class:Opioid Primary Medication:PF-Hydromorphone (Dilaudid)(10 mg/mL) Secondary Medication:PF-Bupivacaine(20 mg/mL) Other Medication:PF-Clonidine(150 g/mL)    Programming:  Type: Simple continuous. See pump readout for details.   Changes:  Medication Change: None at this point Rate Change: Brian Spencer change in rate  Reported side-effects or adverse reactions: None reported  Effectiveness: Described as relatively effective, allowing for increase in activities of daily living (ADL) Clinically meaningful improvement in function (CMIF): Sustained CMIF goals met  Plan: Pump refill today  Pre-op Assessment:  Brian Spencer is a 69 y.o. (year old), male patient, seen today for interventional treatment. 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 Mr. Melendrez has a current medication list which includes the following prescription(s): albuterol, AMBULATORY NON FORMULARY MEDICATION, amlodipine, apixaban, atorvastatin, buspirone, coenzyme q10, furosemide, multivitamin, NON FORMULARY, pantoprazole, propranolol, tamsulosin, tiotropium bromide-olodaterol, trazodone, venlafaxine xr, verapamil, folic acid, methotrexate, and PAIN MANAGEMENT IT PUMP REFILL. His primarily concern today is the Back Pain  Initial Vital Signs:  Pulse/HCG Rate: 68  Temp: (!) 97.3 F (36.3 C) Resp:   BP: (!) 124/56 SpO2: 95 %  BMI: Estimated body mass index is 38.77 kg/m as calculated  from the following:   Height as of this encounter: 5' 5"  (1.651 m).   Weight as of this encounter: 233 lb (105.7 kg).  Risk Assessment: Allergies: Reviewed. He is allergic to baclofen and hydralazine.  Allergy Precautions: None required Coagulopathies: Reviewed. None identified.  Blood-thinner therapy: None at this time Active Infection(s): Reviewed. None identified. Mr. Pounders is afebrile  Site Confirmation: Mr. Kersh was asked to confirm the procedure and laterality before marking the site Procedure checklist: Completed Consent: Before the procedure and under the influence of Brian Spencer sedative(s),  amnesic(s), or anxiolytics, the patient was informed of the treatment options, risks and possible complications. To fulfill our ethical and legal obligations, as recommended by the American Medical Association's Code of Ethics, I have informed the patient of my clinical impression; the nature and purpose of the treatment or procedure; the risks, benefits, and possible complications of the intervention; the alternatives, including doing nothing; the risk(s) and benefit(s) of the alternative treatment(s) or procedure(s); and the risk(s) and benefit(s) of doing nothing.  Mr. Steinfeldt was provided with information about the general risks and possible complications associated with most interventional procedures. These include, but are not limited to: failure to achieve desired goals, infection, bleeding, organ or nerve damage, allergic reactions, paralysis, and/or death.  In addition, he was informed of those risks and possible complications associated to this particular procedure, which include, but are not limited to: damage to the implant; failure to decrease pain; local, systemic, or serious CNS infections, intraspinal abscess with possible cord compression and paralysis, or life-threatening such as meningitis; bleeding; organ damage; nerve injury or damage with subsequent sensory, motor, and/or autonomic system dysfunction, resulting in transient or permanent pain, numbness, and/or weakness of one or several areas of the body; allergic reactions, either minor or major life-threatening, such as anaphylactic or anaphylactoid reactions.  Furthermore, Mr. Fontanilla was informed of those risks and complications associated with the medications. These include, but are not limited to: allergic reactions (i.e.: anaphylactic or anaphylactoid reactions); endorphine suppression; bradycardia and/or hypotension; water retention and/or peripheral vascular relaxation leading to lower extremity edema and possible stasis ulcers;  respiratory depression and/or shortness of breath; decreased metabolic rate leading to weight gain; swelling or edema; medication-induced neural toxicity; particulate matter embolism and blood vessel occlusion with resultant organ, and/or nervous system infarction; and/or intrathecal granuloma formation with possible spinal cord compression and permanent paralysis.  Before refilling the pump Mr. Mcneal was informed that some of the medications used in the devise may not be FDA approved for such use and therefore it constitutes an off-label use of the medications.  Finally, he was informed that Medicine is not an exact science; therefore, there is also the possibility of unforeseen or unpredictable risks and/or possible complications that may result in a catastrophic outcome. The patient indicated having understood very clearly. We have given the patient Brian Spencer guarantees and we have made Brian Spencer promises. Enough time was given to the patient to ask questions, all of which were answered to the patient's satisfaction. Mr. Shabazz has indicated that he wanted to continue with the procedure. Attestation: I, the ordering provider, attest that I have discussed with the patient the benefits, risks, side-effects, alternatives, likelihood of achieving goals, and potential problems during recovery for the procedure that I have provided informed consent. Date  Time: 11/22/2019 12:42 PM  Pre-Procedure Preparation:  Monitoring: As per clinic protocol. Respiration, ETCO2, SpO2, BP, heart rate and rhythm monitor placed and checked for adequate function Safety Precautions: Patient was assessed  for positional comfort and pressure points before starting the procedure. Time-out: I initiated and conducted the "Time-out" before starting the procedure, as per protocol. The patient was asked to participate by confirming the accuracy of the "Time Out" information. Verification of the correct person, site, and procedure were performed and  confirmed by me, the nursing staff, and the patient. "Time-out" conducted as per Joint Commission's Universal Protocol (UP.01.01.01). Time: 1319  Description of Procedure:          Position: Supine Target Area: Central-port of intrathecal pump. Approach: Anterior, 90 degree angle approach. Area Prepped: Entire Area around the pump implant. DuraPrep (Iodine Povacrylex [0.7% available iodine] and Isopropyl Alcohol, 74% w/w) Safety Precautions: Aspiration looking for blood return was conducted prior to all injections. At Brian Spencer point did we inject any substances, as a needle was being advanced. Brian Spencer attempts were made at seeking any paresthesias. Safe injection practices and needle disposal techniques used. Medications properly checked for expiration dates. SDV (single dose vial) medications used. Description of the Procedure: Protocol guidelines were followed. Two nurses trained to do implant refills were present during the entire procedure. The refill medication was checked by both healthcare providers as well as the patient. The patient was included in the "Time-out" to verify the medication. The patient was placed in position. The pump was identified. The area was prepped in the usual manner. The sterile template was positioned over the pump, making sure the side-port location matched that of the pump. Both, the pump and the template were held for stability. The needle provided in the Medtronic Kit was then introduced thru the center of the template and into the central port. The pump content was aspirated and discarded volume documented. The new medication was slowly infused into the pump, thru the filter, making sure to avoid overpressure of the device. The needle was then removed and the area cleansed, making sure to leave some of the prepping solution back to take advantage of its long term bactericidal properties. The pump was interrogated and programmed to reflect the correct medication, volume, and  dosage. The program was printed and taken to the physician for approval. Once checked and signed by the physician, a copy was provided to the patient and another scanned into the EMR. Vitals:   11/22/19 1240  BP: (!) 124/56  Pulse: 68  Temp: (!) 97.3 F (36.3 C)  SpO2: 95%  Weight: 233 lb (105.7 kg)  Height: 5' 5"  (1.651 m)    Start Time: 1319 hrs. End Time: 1354 hrs. Materials & Medications: Medtronic Refill Kit Medication(s): Please see chart orders for details.  Imaging Guidance:          Type of Imaging Technique: None used Indication(s): N/A Exposure Time: Brian Spencer patient exposure Contrast: None used. Fluoroscopic Guidance: N/A Ultrasound Guidance: N/A Interpretation: N/A  Antibiotic Prophylaxis:   Anti-infectives (From admission, onward)   None     Indication(s): None identified  Post-operative Assessment:  Post-procedure Vital Signs:  Pulse/HCG Rate: 68  Temp: (!) 97.3 F (36.3 C) Resp:   BP: (!) 124/56 SpO2: 95 %  EBL: None  Complications: Brian Spencer immediate post-treatment complications observed by team, or reported by patient.  Note: The patient tolerated the entire procedure well. A repeat set of vitals were taken after the procedure and the patient was kept under observation following institutional policy, for this type of procedure. Post-procedural neurological assessment was performed, showing return to baseline, prior to discharge. The patient was provided with post-procedure discharge instructions, including a  section on how to identify potential problems. Should any problems arise concerning this procedure, the patient was given instructions to immediately contact us, at any time, without hesitation. In any case, we plan to contact the patient by telephone for a follow-up status report regarding this interventional procedure.  Comments:  Brian Spencer additional relevant information.  Plan of Care  Orders:  Orders Placed This Encounter  Procedures  . PUMP REFILL     Maintain Protocol by having two(2) healthcare providers during procedure and programming.    Scheduling Instructions:     Please refill intrathecal pump today.    Order Specific Question:   Where will this procedure be performed?    Answer:   [facility]  . PUMP REFILL    Whenever possible schedule on a procedure today.    Standing Status:   Future    Standing Expiration Date:   04/20/2020    Scheduling Instructions:     Please schedule intrathecal pump refill based on pump programming. Avoid schedule intervals of more than 120 days (4 months).    Order Specific Question:   Where will this procedure be performed?    Answer:   [facility]  . Informed Consent Details: Physician/Practitioner Attestation; Transcribe to consent form and obtain patient signature    Provider Attestation: I, Wilkes Dossie Arbour, MD, (Pain Management Specialist), the physician/practitioner, attest that I have discussed with the patient the benefits, risks, side effects, alternatives, likelihood of achieving goals and potential problems during recovery for the procedure that I have provided informed consent.    Scheduling Instructions:     Procedure: Intrathecal Pump Refill     Attending Physician: Beatriz Chancellor A. Dossie Arbour, MD     Indications: Chronic Pain Syndrome (G89.4)     Transcribe to consent form and obtain patient signature.   Chronic Opioid Analgesic:  Intrathecal Hydromorphone (Dilaudid) @ 2.399 mg/d. MME: 9.6 mg/day.   Medications ordered for procedure: Meds ordered this encounter  Medications  . PAIN MANAGEMENT IT PUMP REFILL    Sig: 1 each by Intrathecal route once for 1 dose. Medication: PF -Bupivacaine 10 mg/mL PF -Clonidine 50 mcg/mL PF -Hydromorphone 2.5 mg/mL Total Volume: 40 Needed by 02/14/20    Dispense:  1 each    Refill:  0   Medications administered: Brian Spencer had Brian Spencer medications administered during this visit.  See the medical record for exact dosing, route, and time  of administration.  Follow-up plan:   Return for Pump Refill (Max:2mo.       Interventional management options: Planned, scheduled, and/or pending:   The patient has expressed his desire to continue going down on the medications that he is receiving through the intrathecal pump.  Today we had an extensive conversation about this and we have decided to take the following approach. The plan is to change the concentration of the next refill to the following: PF-Hydromorphone: 10 mg/mL (primary)  PF-Bupivacaine: 20 mg/mL  PF-Clonidine: 150 mcg/mL   We will run this at a rate of aprox. 2.4 mg/day of hydromorphone (20% decrease)   Considering:   NOTE: ELIQUIS ANTICOAGULATION (Stop: 3days  Re-start: 6 hrs) Diagnostic bilateral lumbar facet blocks #1    Palliative PRN treatment(s):   Continue management of intrathecal pump     Recent Visits Brian Spencer visits were found meeting these conditions. Showing recent visits within past 90 days and meeting all other requirements Today's Visits Date Type Provider Dept  11/22/19 Procedure visit NMilinda Pointer MD [facility]  Showing today's visits and meeting all other requirements Future Appointments Date Type Provider Dept  02/14/20 Appointment Milinda Pointer, MD [facility]  Showing future appointments within next 90 days and meeting all other requirements  Disposition: Discharge home  Discharge (Date  Time): 11/22/2019; 1412 hrs.   Primary Care Physician: Brian Spencer, Brian Spencer Location: [facility] Note by: Brian Cola, MD Date: 11/22/2019; Time: 2:56 PM  Disclaimer:  Medicine is not an Chief Strategy Officer. The only guarantee in medicine is that nothing is guaranteed. It is important to note that the decision to proceed with this intervention was based on the information collected from the patient. The Data and conclusions were drawn from the patient's questionnaire, the interview, and the physical  examination. Because the information was provided in large part by the patient, it cannot be guaranteed that it has not been purposely or unconsciously manipulated. Every effort has been made to obtain as much relevant data as possible for this evaluation. It is important to note that the conclusions that lead to this procedure are derived in large part from the available data. Always take into account that the treatment will also be dependent on availability of resources and existing treatment guidelines, considered by other Pain Management Practitioners as being common knowledge and practice, at the time of the intervention. For Medico-Legal purposes, it is also important to point out that variation in procedural techniques and pharmacological choices are the acceptable norm. The indications, contraindications, technique, and results of the above procedure should only be interpreted and judged by a Board-Certified Interventional Pain Specialist with extensive familiarity and expertise in the same exact procedure and technique.

## 2019-11-23 ENCOUNTER — Telehealth: Payer: Self-pay

## 2019-11-23 NOTE — Telephone Encounter (Signed)
Post pump refill follow up.   Patient states he is doing fantastic.

## 2019-11-27 MED FILL — Medication: INTRATHECAL | Qty: 1 | Status: AC

## 2020-01-16 ENCOUNTER — Other Ambulatory Visit: Payer: Self-pay

## 2020-01-16 MED ORDER — PAIN MANAGEMENT IT PUMP REFILL: 1.0000 | Freq: Once | INTRATHECAL | 0 refills | Status: AC

## 2020-02-14 ENCOUNTER — Encounter: Payer: Self-pay | Admitting: Pain Medicine

## 2020-02-14 ENCOUNTER — Other Ambulatory Visit: Payer: Self-pay

## 2020-02-14 ENCOUNTER — Ambulatory Visit: Payer: Worker's Compensation | Attending: Pain Medicine | Admitting: Pain Medicine

## 2020-02-14 VITALS — BP 152/76 | HR 64 | Temp 97.2°F | Resp 18 | Ht 65.0 in | Wt 240.0 lb

## 2020-02-14 DIAGNOSIS — G8929 Other chronic pain: Secondary | ICD-10-CM

## 2020-02-14 DIAGNOSIS — M5441 Lumbago with sciatica, right side: Secondary | ICD-10-CM | POA: Insufficient documentation

## 2020-02-14 DIAGNOSIS — M961 Postlaminectomy syndrome, not elsewhere classified: Secondary | ICD-10-CM | POA: Diagnosis present

## 2020-02-14 DIAGNOSIS — G894 Chronic pain syndrome: Secondary | ICD-10-CM

## 2020-02-14 DIAGNOSIS — Z978 Presence of other specified devices: Secondary | ICD-10-CM

## 2020-02-14 DIAGNOSIS — M51379 Other intervertebral disc degeneration, lumbosacral region without mention of lumbar back pain or lower extremity pain: Secondary | ICD-10-CM

## 2020-02-14 DIAGNOSIS — M5137 Other intervertebral disc degeneration, lumbosacral region: Secondary | ICD-10-CM | POA: Insufficient documentation

## 2020-02-14 DIAGNOSIS — Z79899 Other long term (current) drug therapy: Secondary | ICD-10-CM | POA: Diagnosis present

## 2020-02-14 DIAGNOSIS — M79604 Pain in right leg: Secondary | ICD-10-CM | POA: Diagnosis present

## 2020-02-14 DIAGNOSIS — Z451 Encounter for adjustment and management of infusion pump: Secondary | ICD-10-CM | POA: Diagnosis present

## 2020-02-14 NOTE — Progress Notes (Signed)
PROVIDER NOTE: Information contained herein reflects review and annotations entered in association with encounter. Interpretation of such information and data should be left to medically-trained personnel. Information provided to patient can be located elsewhere in the medical record under "Patient Instructions". Document created using STT-dictation technology, any transcriptional errors that may result from process are unintentional.    Patient: Brian Spencer  Service Category: Procedure  Provider: Gaspar Cola, MD  DOB: February 14, 1951  DOS: 02/14/2020  Location: [facility]  MRN: [id number]  Setting: Ambulatory - outpatient  Referring Provider: No ref. provider found  Type: Established Patient  Specialty: Interventional Pain Management  PCP: Pcp, No   Primary Reason for Visit: Interventional Pain Management Treatment. CC: Pain (Chronic  pain )  Procedure:          Intrathecal Drug Delivery System (IDDS):  Type: Reservoir Refill [phone]) Today we will be changing the concentration of the medication in the pump and we will also be decreasing the right by another 20%. Region: Abdominal Laterality: Right  Type of Pump: Medtronic Synchromed II Delivery Route: Intrathecal Type of Pain Treated: Neuropathic/Nociceptive Primary Medication Class: Opioid/opiate  Medication, Concentration, Infusion Program, & Delivery Rate: Please see scanned programming printout.   Indications: 1. Chronic pain syndrome   2. Failed back surgical syndrome   3. DDD (degenerative disc disease), lumbosacral   4. Chronic low back pain (Primary Source of Pain) (Bilateral) (R>L)   5. Chronic lower extremity pain (Secondary source of pain) (Right)   6. Presence of intrathecal pump   7. Pharmacologic therapy   8. Encounter for adjustment or management of infusion pump    Pain Assessment: Self-Reported Pain Score: 4 /10             Reported level is compatible with observation.        Pharmacotherapy  Assessment  Analgesic: Intrathecal Hydromorphone (Dilaudid) @ 2.399 mg/d. MME: 9.6 mg/day.   Monitoring: [place] PMP: PDMP reviewed during this encounter.       Pharmacotherapy: No side-effects or adverse reactions reported. Compliance: No problems identified. Effectiveness: Clinically acceptable. Plan: Refer to "POC".  UDS:  Summary  Date Value Ref Range Status  11/12/2015 FINAL  Final    Comment:    ==================================================================== [facility] COMP DRUG ANALYSIS,UR ==================================================================== Test                             Result       Flag       Units Drug Present and Declared for Prescription Verification   7-aminoclonazepam              82           EXPECTED   ng/mg creat    7-aminoclonazepam is an expected metabolite of clonazepam. Source    of clonazepam is a scheduled prescription medication.   Trazodone                      PRESENT      EXPECTED   1,3 chlorophenyl piperazine    PRESENT      EXPECTED    1,3-chlorophenyl piperazine is an expected metabolite of    trazodone.   Verapamil                      PRESENT      EXPECTED Drug Present not Declared for Prescription Verification   Hydromorphone  1656         UNEXPECTED ng/mg creat    Hydromorphone may be administered as a scheduled prescription    medication; it is also an expected metabolite of hydrocodone. Drug Absent but Declared for Prescription Verification   Venlafaxine                    Not Detected UNEXPECTED ==================================================================== Test                      Result    Flag   Units      Ref Range   Creatinine              73               mg/dL      >=20 ==================================================================== Declared Medications:  The flagging and interpretation on this report are based on the  following declared medications.  Unexpected results may arise from   inaccuracies in the declared medications.  **Note: The testing scope of this panel includes these medications:  Clonazepam (Klonopin)  Trazodone (Desyrel)  Venlafaxine (Effexor)  Verapamil (Calan)  **Note: The testing scope of this panel does not include following  reported medications:  Albuterol  Budenoside (Symbicort)  Chlorthalidone  Formoterol (Symbicort)  Multivitamin  Tamsulosin (Flomax)  Tiotropium (Spiriva)  Ubiquinone (Coenzyme Q 10)  Vitamin D2 (Ergocalciferol) ==================================================================== For clinical consultation, please call [phone]. ====================================================================     Intrathecal Pump Therapy Assessment  Manufacturer: Medtronic Synchromed Type: Programmable Volume: 40 mL reservoir MRI compatibility: Yes   Initial Drug Concentration and Content:  Primary Medication Class:Opioid Primary Medication:PF-Hydromorphone (Dilaudid)(22m/mL) Secondary Medication:PF-Bupivacaine(242mmL) Other Medication:PF-Clonidine(150g/mL)  Final Drug Concentration and Content:  Primary Medication Class:Opioid Primary Medication:PF-Hydromorphone (Dilaudid)(2.40m41mL) (75% decrease) (1/4) Secondary Medication:PF-Bupivacaine(64m50m) (50% decrease) (1/2) Other Medication:PF-Clonidine(50g/mL) (66% decrease) (1/3)   Programming:  Type: Simple continuous. See pump readout for details.   Changes:  Medication Change: Today we have decrease the concentration of the hydromorphone by 75%, the concentration of the bupivacaine by 50%, and the concentration of the clonidine by 66%. Rate Change: In addition to the above changes, the patient has requested a 20% decrease in his daily dose of the hydromorphone.  The pump was previously running at a rate of 1.5 mg/day of hydromorphone (3 mg/day of bupivacaine and 23 mcg/day of clonidine) with a change of the concentrations and the 20% decrease in  the daily dose of the hydromorphone we have that the pump will be running at a rate of 1.2 mg/day of hydromorphone (4.8 mg/day of bupivacaine and 24 mcg/day of clonidine).  This represents a 20% decrease in the hydromorphone, a 60% increase in the bupivacaine and a 4.1% increase in the clonidine.  Reported side-effects or adverse reactions: None reported  Effectiveness: Described as relatively effective, allowing for increase in activities of daily living (ADL) Clinically meaningful improvement in function (CMIF): Sustained CMIF goals met  Plan: Pump refill today w/ Analysis and programming.  Today we have contacted [employer] to help us [place] and confirm our calculations.  Pre-op H&P Assessment:  Mr. MillRinera 69 y21. (year old), male patient, seen today for interventional treatment. 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 Mr. Toran has a current medication list which includes the following prescription(s): albuterol, AMBULATORY NON FORMULARY MEDICATION, amlodipine, apixaban, atorvastatin, buspirone, coenzyme q10, furosemide, multivitamin, NON FORMULARY, pantoprazole, propranolol, tamsulosin, tiotropium bromide-olodaterol, trazodone, venlafaxine  xr, verapamil, folic acid, and methotrexate. His primarily concern today is the Pain (Chronic  pain )  Initial Vital Signs:  Pulse/HCG Rate: 64  Temp: (!) 97.2 F (36.2 C) Resp: 18 BP: (!) 152/76 SpO2: 93 %  BMI: Estimated body mass index is 39.94 kg/m as calculated from the following:   Height as of this encounter: 5' 5"  (1.651 m).   Weight as of this encounter: 240 lb (108.9 kg).  Risk Assessment: Allergies: Reviewed. He is allergic to baclofen and hydralazine.  Allergy Precautions: None required Coagulopathies: Reviewed. None identified.  Blood-thinner therapy: None at this time Active Infection(s): Reviewed. None identified. Mr. Bergdoll is afebrile  Site Confirmation: Mr. Meckel was asked to confirm the procedure and laterality before marking the site Procedure checklist: Completed Consent: Before the procedure and under the influence of no sedative(s), amnesic(s), or anxiolytics, the patient was informed of the treatment options, risks and possible complications. To fulfill our ethical and legal obligations, as recommended by the American Medical Association's Code of Ethics, I have informed the patient of my clinical impression; the nature and purpose of the treatment or procedure; the risks, benefits, and possible complications of the intervention; the alternatives, including doing nothing; the risk(s) and benefit(s) of the alternative treatment(s) or procedure(s); and the risk(s) and benefit(s) of doing nothing.  Mr. Fluegge was provided with information about the general risks and possible complications associated with most interventional procedures. These include, but are not limited to: failure to achieve desired goals, infection, bleeding, organ or nerve damage, allergic reactions, paralysis, and/or death.  In addition, he was informed of those risks and possible complications associated to this particular procedure, which include, but are not limited to: damage to the implant;  failure to decrease pain; local, systemic, or serious CNS infections, intraspinal abscess with possible cord compression and paralysis, or life-threatening such as meningitis; bleeding; organ damage; nerve injury or damage with subsequent sensory, motor, and/or autonomic system dysfunction, resulting in transient or permanent pain, numbness, and/or weakness of one or several areas of the body; allergic reactions, either minor or major life-threatening, such as anaphylactic or anaphylactoid reactions.  Furthermore, Mr. Malerba was informed of those risks and complications associated with the medications. These include, but are not limited to: allergic reactions (i.e.: anaphylactic or anaphylactoid reactions); endorphine suppression; bradycardia and/or hypotension; water retention and/or peripheral vascular relaxation leading to lower extremity edema and possible stasis ulcers; respiratory depression and/or shortness of breath; decreased metabolic rate leading to weight gain; swelling or edema; medication-induced neural toxicity; particulate matter embolism and blood vessel occlusion with resultant organ, and/or nervous system infarction; and/or intrathecal granuloma formation with possible spinal cord compression and permanent paralysis.  Before refilling the pump Mr. Castile was informed that some of the medications used in the devise may not be FDA approved for such use and therefore it constitutes an off-label use of the medications.  Finally, he was informed that Medicine is not an exact science; therefore, there is also the possibility of unforeseen or unpredictable risks  and/or possible complications that may result in a catastrophic outcome. The patient indicated having understood very clearly. We have given the patient no guarantees and we have made no promises. Enough time was given to the patient to ask questions, all of which were answered to the patient's satisfaction. Mr. Yim has indicated that he  wanted to continue with the procedure. Attestation: I, the ordering provider, attest that I have discussed with the patient the benefits, risks, side-effects, alternatives, likelihood of achieving goals, and potential problems during recovery for the procedure that I have provided informed consent. Date  Time: 02/14/2020 11:59 AM  Pre-Procedure Preparation:  Monitoring: As per clinic protocol. Respiration, ETCO2, SpO2, BP, heart rate and rhythm monitor placed and checked for adequate function Safety Precautions: Patient was assessed for positional comfort and pressure points before starting the procedure. Time-out: I initiated and conducted the "Time-out" before starting the procedure, as per protocol. The patient was asked to participate by confirming the accuracy of the "Time Out" information. Verification of the correct person, site, and procedure were performed and confirmed by me, the nursing staff, and the patient. "Time-out" conducted as per Joint Commission's Universal Protocol (UP.01.01.01). Time: 1300  Description of Procedure:          Position: Supine Target Area: Central-port of intrathecal pump. Approach: Anterior, 90 degree angle approach. Area Prepped: Entire Area around the pump implant. DuraPrep (Iodine Povacrylex [0.7% available iodine] and Isopropyl Alcohol, 74% w/w) Safety Precautions: Aspiration looking for blood return was conducted prior to all injections. At no point did we inject any substances, as a needle was being advanced. No attempts were made at seeking any paresthesias. Safe injection practices and needle disposal techniques used. Medications properly checked for expiration dates. SDV (single dose vial) medications used. Description of the Procedure: Protocol guidelines were followed. Two nurses trained to do implant refills were present during the entire procedure. The refill medication was checked by both healthcare providers as well as the patient. The patient  was included in the "Time-out" to verify the medication. The patient was placed in position. The pump was identified. The area was prepped in the usual manner. The sterile template was positioned over the pump, making sure the side-port location matched that of the pump. Both, the pump and the template were held for stability. The needle provided in the Medtronic Kit was then introduced thru the center of the template and into the central port. The pump content was aspirated and discarded volume documented. The new medication was slowly infused into the pump, thru the filter, making sure to avoid overpressure of the device. The needle was then removed and the area cleansed, making sure to leave some of the prepping solution back to take advantage of its long term bactericidal properties. The pump was interrogated and programmed to reflect the correct medication, volume, and dosage. The program was printed and taken to the physician for approval. Once checked and signed by the physician, a copy was provided to the patient and another scanned into the EMR. Vitals:   02/14/20 1155  BP: (!) 152/76  Pulse: 64  Resp: 18  Temp: (!) 97.2 F (36.2 C)  TempSrc: Temporal  SpO2: 93%  Weight: 240 lb (108.9 kg)  Height: 5' 5"  (1.651 m)    Start Time: 1300 hrs. End Time: 1337 hrs. Materials & Medications: Medtronic Refill Kit Medication(s): Please see chart orders for details.  Imaging Guidance:          Type of Imaging Technique:  None used Indication(s): N/A Exposure Time: No patient exposure Contrast: None used. Fluoroscopic Guidance: N/A Ultrasound Guidance: N/A Interpretation: N/A  Antibiotic Prophylaxis:   Anti-infectives (From admission, onward)   None     Indication(s): None identified  Post-operative Assessment:  Post-procedure Vital Signs:  Pulse/HCG Rate: 64  Temp: (!) 97.2 F (36.2 C) Resp: 18 BP: (!) 152/76 SpO2: 93 %  EBL: None  Complications: No immediate post-treatment  complications observed by team, or reported by patient.  Note: The patient tolerated the entire procedure well. A repeat set of vitals were taken after the procedure and the patient was kept under observation following institutional policy, for this type of procedure. Post-procedural neurological assessment was performed, showing return to baseline, prior to discharge. The patient was provided with post-procedure discharge instructions, including a section on how to identify potential problems. Should any problems arise concerning this procedure, the patient was given instructions to immediately contact us, at any time, without hesitation. In any case, we plan to contact the patient by telephone for a follow-up status report regarding this interventional procedure.  Comments:  No additional relevant information.  Plan of Care  Orders:  Orders Placed This Encounter  Procedures  . PUMP REFILL    Maintain Protocol by having two(2) healthcare providers during procedure and programming.    Scheduling Instructions:     Please refill intrathecal pump today.    Order Specific Question:   Where will this procedure be performed?    Answer:   [facility]  . PUMP REFILL    Whenever possible schedule on a procedure today.    Standing Status:   Future    Standing Expiration Date:   07/13/2020    Scheduling Instructions:     Please schedule intrathecal pump refill based on pump programming. Avoid schedule intervals of more than 120 days (4 months).    Order Specific Question:   Where will this procedure be performed?    Answer:   [facility]  . Informed Consent Details: Physician/Practitioner Attestation; Transcribe to consent form and obtain patient signature    Transcribe to consent form and obtain patient signature.    Order Specific Question:   Physician/Practitioner attestation of informed consent for procedure/surgical case    Answer:   I, the physician/practitioner, attest that I  have discussed with the patient the benefits, risks, side effects, alternatives, likelihood of achieving goals and potential problems during recovery for the procedure that I have provided informed consent.    Order Specific Question:   Procedure    Answer:   Intrathecal pump refill    Order Specific Question:   Physician/Practitioner performing the procedure    Answer:   Attending Physician: Kathlen Brunswick. Dossie Arbour, MD & designated trained staff    Order Specific Question:   Indication/Reason    Answer:   Chronic Pain Syndrome (G89.4), presence of an intrathecal pump (Z97.8)   Chronic Opioid Analgesic:  Intrathecal Hydromorphone (Dilaudid) @ 2.399 mg/d. MME: 9.6 mg/day.   Medications ordered for procedure: No orders of the defined types were placed in this encounter.  Medications administered: Marven Veley had no medications administered during this visit.  See the medical record for exact dosing, route, and time of administration.  Follow-up plan:   Return for Pump Refill (Max:83mo.       Interventional management options: Planned, scheduled, and/or pending:   The patient has expressed his desire to continue going down on the medications that he is receiving through the intrathecal  pump.  Today we had an extensive conversation about this and we have decided to take the following approach. The plan is to change the concentration of the next refill to the following: PF-Hydromorphone: 10 mg/mL (primary)  PF-Bupivacaine: 20 mg/mL  PF-Clonidine: 150 mcg/mL   We will run this at a rate of aprox. 2.4 mg/day of hydromorphone (20% decrease)   Considering:   NOTE: ELIQUIS ANTICOAGULATION (Stop: 3days  Re-start: 6 hrs) Diagnostic bilateral lumbar facet blocks #1    Palliative PRN treatment(s):   Continue management of intrathecal pump      Recent Visits Date Type Provider Dept  11/22/19 Procedure visit Milinda Pointer, MD [facility]  Showing recent visits within past 90 days  and meeting all other requirements Today's Visits Date Type Provider Dept  02/14/20 Procedure visit Milinda Pointer, MD [facility]  Showing today's visits and meeting all other requirements Future Appointments No visits were found meeting these conditions. Showing future appointments within next 90 days and meeting all other requirements  Disposition: Discharge home  Discharge (Date  Time): 02/14/2020; 1357 hrs.   Primary Care Physician: Pcp, No Location: [facility] Note by: Gaspar Cola, MD Date: 02/14/2020; Time: 4:53 PM  Disclaimer:  Medicine is not an Chief Strategy Officer. The only guarantee in medicine is that nothing is guaranteed. It is important to note that the decision to proceed with this intervention was based on the information collected from the patient. The Data and conclusions were drawn from the patient's questionnaire, the interview, and the physical examination. Because the information was provided in large part by the patient, it cannot be guaranteed that it has not been purposely or unconsciously manipulated. Every effort has been made to obtain as much relevant data as possible for this evaluation. It is important to note that the conclusions that lead to this procedure are derived in large part from the available data. Always take into account that the treatment will also be dependent on availability of resources and existing treatment guidelines, considered by other Pain Management Practitioners as being common knowledge and practice, at the time of the intervention. For Medico-Legal purposes, it is also important to point out that variation in procedural techniques and pharmacological choices are the acceptable norm. The indications, contraindications, technique, and results of the above procedure should only be interpreted and judged by a Board-Certified Interventional Pain Specialist with extensive familiarity and expertise in  the same exact procedure and technique.

## 2020-02-14 NOTE — Progress Notes (Signed)
Safety precautions to be maintained throughout the outpatient stay will include: orient to surroundings, keep bed in low position, maintain call bell within reach at all times, provide assistance with transfer out of bed and ambulation.  

## 2020-02-15 ENCOUNTER — Telehealth: Payer: Self-pay | Admitting: *Deleted

## 2020-02-15 NOTE — Telephone Encounter (Signed)
Called for post pump refill check. Denies any problems.

## 2020-05-01 ENCOUNTER — Other Ambulatory Visit: Payer: Self-pay

## 2020-05-01 MED ORDER — PAIN MANAGEMENT IT PUMP REFILL: 1.0000 | Freq: Once | INTRATHECAL | 0 refills | Status: AC

## 2020-05-11 ENCOUNTER — Telehealth: Payer: Self-pay

## 2020-05-11 ENCOUNTER — Ambulatory Visit: Payer: Worker's Compensation | Attending: Pain Medicine | Admitting: Pain Medicine

## 2020-05-11 ENCOUNTER — Encounter: Payer: Self-pay | Admitting: Pain Medicine

## 2020-05-11 ENCOUNTER — Other Ambulatory Visit: Payer: Self-pay

## 2020-05-11 VITALS — BP 140/68 | HR 57 | Temp 97.3°F | Resp 16 | Ht 65.0 in | Wt 242.0 lb

## 2020-05-11 DIAGNOSIS — G894 Chronic pain syndrome: Secondary | ICD-10-CM | POA: Insufficient documentation

## 2020-05-11 DIAGNOSIS — Z79899 Other long term (current) drug therapy: Secondary | ICD-10-CM | POA: Diagnosis not present

## 2020-05-11 DIAGNOSIS — Z451 Encounter for adjustment and management of infusion pump: Secondary | ICD-10-CM | POA: Insufficient documentation

## 2020-05-11 DIAGNOSIS — M5137 Other intervertebral disc degeneration, lumbosacral region: Secondary | ICD-10-CM | POA: Insufficient documentation

## 2020-05-11 DIAGNOSIS — M961 Postlaminectomy syndrome, not elsewhere classified: Secondary | ICD-10-CM | POA: Insufficient documentation

## 2020-05-11 DIAGNOSIS — G8929 Other chronic pain: Secondary | ICD-10-CM

## 2020-05-11 DIAGNOSIS — M549 Dorsalgia, unspecified: Secondary | ICD-10-CM | POA: Insufficient documentation

## 2020-05-11 DIAGNOSIS — M79604 Pain in right leg: Secondary | ICD-10-CM | POA: Diagnosis not present

## 2020-05-11 DIAGNOSIS — M5441 Lumbago with sciatica, right side: Secondary | ICD-10-CM | POA: Insufficient documentation

## 2020-05-11 DIAGNOSIS — Z978 Presence of other specified devices: Secondary | ICD-10-CM | POA: Diagnosis not present

## 2020-05-11 NOTE — Progress Notes (Signed)
Safety precautions to be maintained throughout the outpatient stay will include: orient to surroundings, keep bed in low position, maintain call bell within reach at all times, provide assistance with transfer out of bed and ambulation.  

## 2020-05-11 NOTE — Patient Instructions (Signed)
Opioid Overdose Opioids are drugs that are often used to treat pain. Opioids include illegal drugs, such as heroin, as well as prescription pain medicines, such as codeine, morphine, hydrocodone, oxycodone, and fentanyl. An opioid overdose happens when you take too much of an opioid. An overdose may be intentional or accidental and can happen with any type of opioid. The effects of an overdose can be mild, dangerous, or even deadly. Opioid overdose is a medical emergency. What are the causes? This condition may be caused by:  Taking too much of an opioid on purpose.  Taking too much of an opioid by accident.  Using two or more substances that contain opioids at the same time.  Taking an opioid with a substance that affects your heart, breathing, or blood pressure. These include alcohol, tranquilizers, sleeping pills, illegal drugs, and some over-the-counter medicines. This condition may also happen due to an error made by:  A health care provider who prescribes a medicine.  The pharmacist who fills the prescription order. What increases the risk? This condition is more likely in:  Children. They may be attracted to colorful pills. Because of a child's small size, even a small amount of a drug can be dangerous.  Older people. They may be taking many different drugs. Older people may have difficulty reading labels or remembering when they last took their medicine. They may also be more sensitive to the effects of opioids.  People with chronic medical conditions, especially heart, liver, kidney, or neurological diseases.  People who take an opioid for a long period of time.  People who use: ? Illegal drugs. IV heroin is especially dangerous. ? Other substances, including alcohol, while using an opioid.  People who have: ? A history of drug or alcohol abuse. ? Certain mental health conditions. ? A history of previous drug overdoses.  People who take opioids that are not prescribed  for them. What are the signs or symptoms? Symptoms of this condition depend on the type of opioid and the amount that was taken. Common symptoms include:  Sleepiness or difficulty waking from sleep.  Decrease in attention.  Confusion.  Slurred speech.  Slowed breathing and a slow pulse (bradycardia).  Nausea and vomiting.  Abnormally small pupils. Signs and symptoms that require emergency treatment include:  Cold, clammy, and pale skin.  Blue lips and fingernails.  Vomiting.  Gurgling sounds in the throat.  A pulse that is very slow or difficult to detect.  Breathing that is very irregular, slow, noisy, or difficult to detect.  Limp body.  Inability to respond to speech or be awakened from sleep (stupor).  Seizures. How is this diagnosed? This condition is diagnosed based on your symptoms and medical history. It is important to tell your health care provider:  About all of the opioids that you took.  When you took the opioids.  Whether you were drinking alcohol or using marijuana, cocaine, or other drugs. Your health care provider will do a physical exam. This exam may include:  Checking and monitoring your heart rate and rhythm, breathing rate, temperature, and blood pressure (vital signs).  Measuring oxygen levels in your blood.  Checking for abnormally small pupils. You may also have blood tests or urine tests. You may have X-rays if you are having severe breathing problems. How is this treated? This condition requires immediate medical treatment and hospitalization. Treatment is given in the hospital intensive care (ICU) setting. Supporting your vital signs and your breathing is the first step in   treating an opioid overdose. Treatment may also include:  Giving salts and minerals (electrolytes) along with fluids through an IV.  Inserting a breathing tube (endotracheal tube) in your airway to help you breathe if you cannot breathe on your own or you are in  danger of not being able to breathe on your own.  Giving oxygen through a small tube under your nose.  Passing a tube through your nose and into your stomach (nasogastric tube, or NG tube) to empty your stomach.  Giving medicines that: ? Increase your blood pressure. ? Relieve nausea and vomiting. ? Relieve abdominal pain and cramping. ? Reverse the effects of the opioid (naloxone).  Monitoring your heart and oxygen levels.  Ongoing counseling and mental health support if you intentionally overdosed or used an illegal drug. Follow these instructions at home: Medicines  Take over-the-counter and prescription medicines only as told by your health care provider.  Always ask your health care provider about possible side effects and interactions of any new medicine that you start taking.  Keep a list of all the medicines that you take, including over-the-counter medicines. Bring this list with you to all your medical visits. General instructions  Drink enough fluid to keep your urine pale yellow.  Keep all follow-up visits as told by your health care provider. This is important.   How is this prevented?  Read the drug inserts that come with your opioid pain medicines.  Take medicines only as told by your health care provider. Do not take more medicine than you are told. Do not take medicines more frequently than you are told.  Do not drink alcohol or take sedatives when taking opioids.  Do not use illegal or recreational drugs, including cocaine, ecstasy, and marijuana.  Do not take opioid medicines that are not prescribed for you.  Store all medicines in safety containers that are out of the reach of children.  Get help if you are struggling with: ? Alcohol or drug use. ? Depression or another mental health problem. ? Thoughts of hurting yourself or another person.  Keep the phone number of your local poison control center near your phone or in your mobile phone. In the  [place] the hotline of the National Poison Control Center is (800) 222-1222.  If you were prescribed naloxone, make sure you understand how to take it. Contact a health care provider if you:  Need help understanding how to take your pain medicines.  Feel your medicines are too strong.  Are concerned that your pain medicines are not working well for your pain.  Develop new symptoms or side effects when you are taking medicines. Get help right away if:  You or someone else is having symptoms of an opioid overdose. Get help even if you are not sure.  You have serious thoughts about hurting yourself or others.  You have: ? Chest pain. ? Difficulty breathing. ? A loss of consciousness. These symptoms may represent a serious problem that is an emergency. Do not wait to see if the symptoms will go away. Get medical help right away. Call your local emergency services (911 in the [place] Do not drive yourself to the hospital. If you ever feel like you may hurt yourself or others, or have thoughts about taking your own life, get help right away. You can go to your nearest emergency department or call:  Your local emergency services (911 in the [place]  A suicide crisis helpline, such as the National Suicide Prevention Lifeline   at 1-800-273-8255. This is open 24 hours a day. Summary  Opioids are drugs that are often used to treat pain. Opioids include illegal drugs, such as heroin, as well as prescription pain medicines.  An opioid overdose happens when you take too much of an opioid.  Overdoses can be intentional or accidental.  Opioid overdose is very dangerous. It is a life-threatening emergency.  If you or someone you know is experiencing an opioid overdose, get help right away. This information is not intended to replace advice given to you by your health care provider. Make sure you discuss any questions you have with your health care provider. Document Revised: 01/21/2018 Document  Reviewed: 01/21/2018 [employer] Patient Education  2021 [employer]  

## 2020-05-14 NOTE — Progress Notes (Signed)
PROVIDER NOTE: Information contained herein reflects review and annotations entered in association with encounter. Interpretation of such information and data should be left to medically-trained personnel. Information provided to patient can be located elsewhere in the medical record under "Patient Instructions". Document created using STT-dictation technology, any transcriptional errors that may result from process are unintentional.    Patient: Brian Spencer  Service Category: Procedure  Provider: Gaspar Cola, MD  DOB: 1950-09-25  DOS: 05/11/2020  Location: [facility]  MRN: [id number]  Setting: Ambulatory - outpatient  Referring Provider: No ref. provider found  Type: Established Patient  Specialty: Interventional Pain Management  PCP: Pcp, No   Primary Reason for Visit: Interventional Pain Management Treatment. CC: Back Pain  Procedure:          Intrathecal Drug Delivery System (IDDS):  Type: Reservoir Refill [phone]) No rate change Region: Abdominal Laterality: Right  Type of Pump: Medtronic Synchromed II Delivery Route: Intrathecal Type of Pain Treated: Neuropathic/Nociceptive Primary Medication Class: Opioid/opiate  Medication, Concentration, Infusion Program, & Delivery Rate: Please see scanned programming printout.   Indications: 1. Chronic pain syndrome   2. Failed back surgical syndrome   3. DDD (degenerative disc disease), lumbosacral   4. Chronic low back pain (Primary Source of Pain) (Bilateral) (R>L)   5. Chronic lower extremity pain (Secondary source of pain) (Right)   6. Presence of intrathecal pump   7. Pharmacologic therapy   8. Encounter for adjustment or management of infusion pump    Pain Assessment: Self-Reported Pain Score: 3 /10             Reported level is compatible with observation.         The patient comes into the clinic today for a pump refill.  Unfortunately, on his last visit he was scheduled to return past the duration of his  intrathecal pump medicine.  The patient called and indicated that he could hear the alarm on his pump.  Review of the pump readout immediately revealed the scheduling error.  The compounding pharmacy was immediately notified to see if they could provide [place] with the compounded medication so as to refill the pump.  After great efforts, we were able to secure the medication which arrived to the facility and was immediately used to refill the device in the usual manner.  Pharmacotherapy Assessment  Analgesic: Intrathecal Hydromorphone (Dilaudid) @ 2.399 mg/d. MME: 9.6 mg/day.   Monitoring: [place] PMP: PDMP reviewed during this encounter.       Pharmacotherapy: No side-effects or adverse reactions reported. Compliance: No problems identified. Effectiveness: Clinically acceptable. Plan: Refer to "POC".  UDS:  Summary  Date Value Ref Range Status  11/12/2015 FINAL  Final    Comment:    ==================================================================== [facility] COMP DRUG ANALYSIS,UR ==================================================================== Test                             Result       Flag       Units Drug Present and Declared for Prescription Verification   7-aminoclonazepam              82           EXPECTED   ng/mg creat    7-aminoclonazepam is an expected metabolite of clonazepam. Source    of clonazepam is a scheduled prescription medication.   Trazodone  PRESENT      EXPECTED   1,3 chlorophenyl piperazine    PRESENT      EXPECTED    1,3-chlorophenyl piperazine is an expected metabolite of    trazodone.   Verapamil                      PRESENT      EXPECTED Drug Present not Declared for Prescription Verification   Hydromorphone                  1656         UNEXPECTED ng/mg creat    Hydromorphone may be administered as a scheduled prescription    medication; it is also an expected metabolite of hydrocodone. Drug Absent but Declared for Prescription  Verification   Venlafaxine                    Not Detected UNEXPECTED ==================================================================== Test                      Result    Flag   Units      Ref Range   Creatinine              73               mg/dL      >=20 ==================================================================== Declared Medications:  The flagging and interpretation on this report are based on the  following declared medications.  Unexpected results may arise from  inaccuracies in the declared medications.  **Note: The testing scope of this panel includes these medications:  Clonazepam (Klonopin)  Trazodone (Desyrel)  Venlafaxine (Effexor)  Verapamil (Calan)  **Note: The testing scope of this panel does not include following  reported medications:  Albuterol  Budenoside (Symbicort)  Chlorthalidone  Formoterol (Symbicort)  Multivitamin  Tamsulosin (Flomax)  Tiotropium (Spiriva)  Ubiquinone (Coenzyme Q 10)  Vitamin D2 (Ergocalciferol) ==================================================================== For clinical consultation, please call [phone]. ====================================================================     Intrathecal Pump Therapy Assessment  Manufacturer: Medtronic Synchromed Type: Programmable Volume: 40 mL reservoir MRI compatibility: Yes   Drug content:  Primary Medication Class: Opioid Primary Medication:PF-Hydromorphone (Dilaudid)(2.14m/mL)  Secondary Medication:PF-Bupivacaine(165mmL)  Other Medication:PF-Clonidine(50g/mL)    Programming:  Type: Simple continuous. See pump readout for details.   Changes:  Medication Change: None at this point Rate Change: No change in rate  Reported side-effects or adverse reactions: None reported  Effectiveness: Described as relatively effective, allowing for increase in activities of daily living (ADL) Clinically meaningful improvement in function (CMIF): Sustained CMIF  goals met  Plan: Pump refill today  Pre-op H&P Assessment:  Mr. MiBattertons a [age].o. (year old), male patient, seen today for interventional treatment. 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 Mr. MiBowlbyas a current medication list which includes the following prescription(s): albuterol, AMBULATORY NON FORMULARY MEDICATION, amlodipine, apixaban, atorvastatin, buspirone, coenzyme q10, furosemide, metformin, multivitamin, NON  FORMULARY, pantoprazole, propranolol, tamsulosin, tiotropium bromide-olodaterol, trazodone, venlafaxine xr, and verapamil. His primarily concern today is the Back Pain  Initial Vital Signs:  Pulse/HCG Rate: (!) 57  Temp: (!) 97.3 F (36.3 C) Resp: 16 BP: 140/68 SpO2: 93 %  BMI: Estimated body mass index is 40.27 kg/m as calculated from the following:   Height as of this encounter: 5' 5"  (1.651 m).   Weight as of this encounter: 242 lb (109.8 kg).  Risk Assessment: Allergies: Reviewed. He is allergic to baclofen and hydralazine.  Allergy Precautions: None required Coagulopathies: Reviewed. None identified.  Blood-thinner therapy: None at this time Active Infection(s): Reviewed. None identified. Mr. Duell is  afebrile  Site Confirmation: Mr. Grimley was asked to confirm the procedure and laterality before marking the site Procedure checklist: Completed Consent: Before the procedure and under the influence of no sedative(s), amnesic(s), or anxiolytics, the patient was informed of the treatment options, risks and possible complications. To fulfill our ethical and legal obligations, as recommended by the American Medical Association's Code of Ethics, I have informed the patient of my clinical impression; the nature and purpose of the treatment or procedure; the risks, benefits, and possible complications of the intervention; the alternatives, including doing nothing; the risk(s) and benefit(s) of the alternative treatment(s) or procedure(s); and the risk(s) and benefit(s) of doing nothing.  Mr. Mark was provided with information about the general risks and possible complications associated with most interventional procedures. These include, but are not limited to: failure to achieve desired goals, infection, bleeding, organ or nerve damage, allergic reactions, paralysis, and/or death.  In addition, he was informed of those risks and possible complications associated to this particular procedure, which include, but are not limited to: damage to the implant; failure to decrease pain; local, systemic, or serious CNS infections, intraspinal abscess with possible cord compression and paralysis, or life-threatening such as meningitis; bleeding; organ damage; nerve injury or damage with subsequent sensory, motor, and/or autonomic system dysfunction, resulting in transient or permanent pain, numbness, and/or weakness of one or several areas of the body; allergic reactions, either minor or major life-threatening, such as anaphylactic or anaphylactoid reactions.  Furthermore, Mr. Schwinn was informed of those risks and complications associated with the medications. These include, but are not limited to: allergic reactions  (i.e.: anaphylactic or anaphylactoid reactions); endorphine suppression; bradycardia and/or hypotension; water retention and/or peripheral vascular relaxation leading to lower extremity edema and possible stasis ulcers; respiratory depression and/or shortness of breath; decreased metabolic rate leading to weight gain; swelling or edema; medication-induced neural toxicity; particulate matter embolism and blood vessel occlusion with resultant organ, and/or nervous system infarction; and/or intrathecal granuloma formation with possible spinal cord compression and permanent paralysis.  Before refilling the pump Mr. Waltman was informed that some of the medications used in the devise may not be FDA approved for such use and therefore it constitutes an off-label use of the medications.  Finally, he was informed that Medicine is not an exact science; therefore, there is also the possibility of unforeseen or unpredictable risks and/or possible complications that may result in a catastrophic outcome. The patient indicated having understood very clearly. We have given the patient no guarantees and we have made no promises. Enough time was given to the patient to ask questions, all of which were answered to the patient's satisfaction. Mr. Levick has indicated that he wanted to continue with the procedure. Attestation: I, the ordering provider, attest that I have discussed with the patient the benefits, risks, side-effects, alternatives,  likelihood of achieving goals, and potential problems during recovery for the procedure that I have provided informed consent. Date  Time: 05/11/2020  2:15 PM  Pre-Procedure Preparation:  Monitoring: As per clinic protocol. Respiration, ETCO2, SpO2, BP, heart rate and rhythm monitor placed and checked for adequate function Safety Precautions: Patient was assessed for positional comfort and pressure points before starting the procedure. Time-out: I initiated and conducted the "Time-out"  before starting the procedure, as per protocol. The patient was asked to participate by confirming the accuracy of the "Time Out" information. Verification of the correct person, site, and procedure were performed and confirmed by me, the nursing staff, and the patient. "Time-out" conducted as per Joint Commission's Universal Protocol (UP.01.01.01). Time: 1415  Description of Procedure:          Position: Supine Target Area: Central-port of intrathecal pump. Approach: Anterior, 90 degree angle approach. Area Prepped: Entire Area around the pump implant. DuraPrep (Iodine Povacrylex [0.7% available iodine] and Isopropyl Alcohol, 74% w/w) Safety Precautions: Aspiration looking for blood return was conducted prior to all injections. At no point did we inject any substances, as a needle was being advanced. No attempts were made at seeking any paresthesias. Safe injection practices and needle disposal techniques used. Medications properly checked for expiration dates. SDV (single dose vial) medications used. Description of the Procedure: Protocol guidelines were followed. Two nurses trained to do implant refills were present during the entire procedure. The refill medication was checked by both healthcare providers as well as the patient. The patient was included in the "Time-out" to verify the medication. The patient was placed in position. The pump was identified. The area was prepped in the usual manner. The sterile template was positioned over the pump, making sure the side-port location matched that of the pump. Both, the pump and the template were held for stability. The needle provided in the Medtronic Kit was then introduced thru the center of the template and into the central port. The pump content was aspirated and discarded volume documented. The new medication was slowly infused into the pump, thru the filter, making sure to avoid overpressure of the device. The needle was then removed and the area  cleansed, making sure to leave some of the prepping solution back to take advantage of its long term bactericidal properties. The pump was interrogated and programmed to reflect the correct medication, volume, and dosage. The program was printed and taken to the physician for approval. Once checked and signed by the physician, a copy was provided to the patient and another scanned into the EMR. Vitals:   05/11/20 1414  BP: 140/68  Pulse: (!) 57  Resp: 16  Temp: (!) 97.3 F (36.3 C)  SpO2: 93%  Weight: 242 lb (109.8 kg)  Height: 5' 5"  (1.651 m)    Start Time: 1415 hrs. End Time: 1439 hrs. Materials & Medications: Medtronic Refill Kit Medication(s): Please see chart orders for details.  Imaging Guidance:          Type of Imaging Technique: None used Indication(s): N/A Exposure Time: No patient exposure Contrast: None used. Fluoroscopic Guidance: N/A Ultrasound Guidance: N/A Interpretation: N/A  Antibiotic Prophylaxis:   Anti-infectives (From admission, onward)   None     Indication(s): None identified  Post-operative Assessment:  Post-procedure Vital Signs:  Pulse/HCG Rate: (!) 57  Temp: (!) 97.3 F (36.3 C) Resp: 16 BP: 140/68 SpO2: 93 %  EBL: None  Complications: No immediate post-treatment complications observed by team, or reported by patient.  Note: The patient tolerated the entire procedure well. A repeat set of vitals were taken after the procedure and the patient was kept under observation following institutional policy, for this type of procedure. Post-procedural neurological assessment was performed, showing return to baseline, prior to discharge. The patient was provided with post-procedure discharge instructions, including a section on how to identify potential problems. Should any problems arise concerning this procedure, the patient was given instructions to immediately contact us, at any time, without hesitation. In any case, we plan to contact the patient  by telephone for a follow-up status report regarding this interventional procedure.  Comments:  No additional relevant information.  Plan of Care  Orders:  Orders Placed This Encounter  Procedures  . PUMP REFILL    Maintain Protocol by having two(2) healthcare providers during procedure and programming.    Scheduling Instructions:     Please refill intrathecal pump today.    Order Specific Question:   Where will this procedure be performed?    Answer:   [facility]  . PUMP REFILL    Whenever possible schedule on a procedure today.    Standing Status:   Future    Standing Expiration Date:   10/11/2020    Scheduling Instructions:     Please schedule intrathecal pump refill based on pump programming. Avoid schedule intervals of more than 120 days (4 months).    Order Specific Question:   Where will this procedure be performed?    Answer:   [facility]  . Informed Consent Details: Physician/Practitioner Attestation; Transcribe to consent form and obtain patient signature    Transcribe to consent form and obtain patient signature.    Order Specific Question:   Physician/Practitioner attestation of informed consent for procedure/surgical case    Answer:   I, the physician/practitioner, attest that I have discussed with the patient the benefits, risks, side effects, alternatives, likelihood of achieving goals and potential problems during recovery for the procedure that I have provided informed consent.    Order Specific Question:   Procedure    Answer:   Intrathecal pump refill    Order Specific Question:   Physician/Practitioner performing the procedure    Answer:   Attending Physician: Kathlen Brunswick. Dossie Arbour, MD & designated trained staff    Order Specific Question:   Indication/Reason    Answer:   Chronic Pain Syndrome (G89.4), presence of an intrathecal pump (Z97.8)   Chronic Opioid Analgesic:  Intrathecal Hydromorphone (Dilaudid) @ 2.399 mg/d. MME: 9.6 mg/day.    Medications ordered for procedure: No orders of the defined types were placed in this encounter.  Medications administered: Brian Spencer had no medications administered during this visit.  See the medical record for exact dosing, route, and time of administration.  Follow-up plan:   Return for Pump Refill (Max:76mo.       Interventional management options: Planned, scheduled, and/or pending:      Considering:   NOTE: ELIQUIS ANTICOAGULATION (Stop: 3days  Re-start: 6 hrs) Diagnostic bilateral lumbar facet blocks #1    Palliative PRN treatment(s):   Continue management of intrathecal pump     Recent Visits Date Type Provider Dept  05/11/20 Procedure visit NMilinda Pointer MD [facility]  02/14/20 Procedure visit NMilinda Pointer MD [facility]  Showing recent visits within past 90 days and meeting all other requirements Future Appointments Date Type Provider Dept  07/19/20 Appointment NMilinda Pointer MD [facility]  Showing future appointments within next 90 days and meeting  all other requirements  Disposition: Discharge home  Discharge (Date  Time): 05/11/2020; 1500 hrs.   Primary Care Physician: Pcp, No Location: [facility] Note by: Brian Cola, MD Date: 05/11/2020; Time: 5:29 AM  Disclaimer:  Medicine is not an Chief Strategy Officer. The only guarantee in medicine is that nothing is guaranteed. It is important to note that the decision to proceed with this intervention was based on the information collected from the patient. The Data and conclusions were drawn from the patient's questionnaire, the interview, and the physical examination. Because the information was provided in large part by the patient, it cannot be guaranteed that it has not been purposely or unconsciously manipulated. Every effort has been made to obtain as much relevant data as possible for this evaluation. It is important to note that  the conclusions that lead to this procedure are derived in large part from the available data. Always take into account that the treatment will also be dependent on availability of resources and existing treatment guidelines, considered by other Pain Management Practitioners as being common knowledge and practice, at the time of the intervention. For Medico-Legal purposes, it is also important to point out that variation in procedural techniques and pharmacological choices are the acceptable norm. The indications, contraindications, technique, and results of the above procedure should only be interpreted and judged by a Board-Certified Interventional Pain Specialist with extensive familiarity and expertise in the same exact procedure and technique.

## 2020-05-22 ENCOUNTER — Encounter: Payer: Self-pay | Admitting: Pain Medicine

## 2020-05-24 MED FILL — Medication: INTRATHECAL | Qty: 1 | Status: AC

## 2020-06-26 ENCOUNTER — Other Ambulatory Visit: Payer: Self-pay | Admitting: Pain Medicine

## 2020-06-26 DIAGNOSIS — Z462 Encounter for fitting and adjustment of other devices related to nervous system and special senses: Secondary | ICD-10-CM | POA: Insufficient documentation

## 2020-06-26 NOTE — Progress Notes (Signed)
Intrathecal pump will be needing replacement due to battery on June 2022.  We will be referring the patient to neurosurgery for pump exchange.  If at all possible, will like to keep it to a 40 mL pump.  Currently the patient has preservative-free bupivacaine 10 mg/mL + preservative-free clonidine 50 mcg/mL + preservative-free hydromorphone 2.5 mg/mL on a 40 mL pump.  We will continue with the same mixture and we have the pump running on a simple continuous infusion at 1.2276 mg/day of the hydromorphone.

## 2020-06-27 ENCOUNTER — Other Ambulatory Visit: Payer: Self-pay

## 2020-06-27 MED ORDER — PAIN MANAGEMENT IT PUMP REFILL: 1.0000 | Freq: Once | INTRATHECAL | 0 refills | Status: AC

## 2020-07-18 NOTE — Progress Notes (Signed)
PROVIDER NOTE: Information contained herein reflects review and annotations entered in association with encounter. Interpretation of such information and data should be left to medically-trained personnel. Information provided to patient can be located elsewhere in the medical record under "Patient Instructions". Document created using STT-dictation technology, any transcriptional errors that may result from process are unintentional.    Patient: Brian Spencer  Service Category: Procedure  Provider: Gaspar Cola, MD  DOB: Dec 05, 1950  DOS: 07/19/2020  Location: [facility]  MRN: [id number]  Setting: Ambulatory - outpatient  Referring Provider: No ref. provider found  Type: Established Patient  Specialty: Interventional Pain Management  PCP: Pcp, No   Primary Reason for Visit: Interventional Pain Management Treatment. CC: Back Pain (low)  Procedure:          Intrathecal Drug Delivery System (IDDS):  Type: Reservoir Refill [phone]) No rate change Region: Abdominal Laterality: Right  Type of Pump: Medtronic Synchromed II Delivery Route: Intrathecal Type of Pain Treated: Neuropathic/Nociceptive Primary Medication Class: Opioid/opiate  Medication, Concentration, Infusion Program, & Delivery Rate: Please see scanned programming printout.   Indications: 1. Failed back surgical syndrome   2. Chronic low back pain (1ry area of Pain) (Bilateral) (R>L)   3. Chronic lower extremity pain (2ry area of Pain) (Right)   4. DDD (degenerative disc disease), lumbosacral   5. Fusion of lumbar spine   6. Chronic pain syndrome   7. Presence of intrathecal pump   8. Encounter for adjustment or management of infusion pump    Pain Assessment: Self-Reported Pain Score: 4 /10             Reported level is compatible with observation.         Pharmacotherapy Assessment  Analgesic: Intrathecal Hydromorphone (Dilaudid) @ 2.399 mg/d. MME: 9.6 mg/day.   Monitoring: [place] PMP: PDMP reviewed during  this encounter.       Pharmacotherapy: No side-effects or adverse reactions reported. Compliance: No problems identified. Effectiveness: Clinically acceptable. Plan: Refer to "POC".  UDS:  Summary  Date Value Ref Range Status  11/12/2015 FINAL  Final    Comment:    ==================================================================== TOXAS[facility]RE COMP DRUG ANALYSIS,UR ==================================================================== Test                             Result       Flag       Units Drug Present and Declared for Prescription Verification   7-aminoclonazepam              82           EXPECTED   ng/mg creat    7-aminoclonazepam is an expected metabolite of clonazepam. Source    of clonazepam is a scheduled prescription medication.   Trazodone                      PRESENT      EXPECTED   1,3 chlorophenyl piperazine    PRESENT      EXPECTED    1,3-chlorophenyl piperazine is an expected metabolite of    trazodone.   Verapamil                      PRESENT      EXPECTED Drug Present not Declared for Prescription Verification   Hydromorphone                  1656  UNEXPECTED ng/mg creat    Hydromorphone may be administered as a scheduled prescription    medication; it is also an expected metabolite of hydrocodone. Drug Absent but Declared for Prescription Verification   Venlafaxine                    Not Detected UNEXPECTED ==================================================================== Test                      Result    Flag   Units      Ref Range   Creatinine              73               mg/dL      >=20 ==================================================================== Declared Medications:  The flagging and interpretation on this report are based on the  following declared medications.  Unexpected results may arise from  inaccuracies in the declared medications.  **Note: The testing scope of this panel includes these medications:  Clonazepam (Klonopin)   Trazodone (Desyrel)  Venlafaxine (Effexor)  Verapamil (Calan)  **Note: The testing scope of this panel does not include following  reported medications:  Albuterol  Budenoside (Symbicort)  Chlorthalidone  Formoterol (Symbicort)  Multivitamin  Tamsulosin (Flomax)  Tiotropium (Spiriva)  Ubiquinone (Coenzyme Q 10)  Vitamin D2 (Ergocalciferol) ==================================================================== For clinical consultation, please call [phone]. ====================================================================     Intrathecal Pump Therapy Assessment  Manufacturer: Medtronic Synchromed Type: Programmable Volume: 40 mL reservoir MRI compatibility: Yes   Drug content:  Primary Medication Class: Opioid Primary Medication: PF-Fentanyl Secondary Medication: see pump readout Other Medication: see pump readout   Programming:  Type: Simple continuous. See pump readout for details.   Changes:  Medication Change: None at this point Rate Change: No change in rate  Reported side-effects or adverse reactions: None reported  Effectiveness: Described as relatively effective, allowing for increase in activities of daily living (ADL) Clinically meaningful improvement in function (CMIF): Sustained CMIF goals met  Plan: Pump refill today  Pre-op H&P Assessment:  Mr. Prokop is a 70 y.o. (year old), male patient, seen today for interventional treatment. 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 Mr. Statler has a current medication list which includes the following prescription(s): albuterol, amlodipine, apixaban, atorvastatin, buspirone, coenzyme q10, furosemide, metformin, multivitamin, NON FORMULARY, pantoprazole, propranolol, tamsulosin, tiotropium bromide-olodaterol, trazodone, venlafaxine xr, verapamil, and AMBULATORY NON FORMULARY MEDICATION. His primarily concern today is the Back Pain (low)  Initial Vital Signs:  Pulse/HCG Rate: 64  Temp: (!) 97.2 F (36.2 C) Resp: 16 BP: 139/66 SpO2: 98 %  BMI: Estimated body mass index is 41.54 kg/m as calculated from the following:   Height as of this encounter: _0  (1.626 m).   Weight as of this encounter: 242 lb (109.8 kg).  Risk Assessment: Allergies: Reviewed. He is allergic to baclofen and hydralazine.  Allergy Precautions: None required Coagulopathies: Reviewed. None identified.  Blood-thinner therapy: None at this time Active Infection(s): Reviewed. None identified. Mr. Monette is afebrile  Site Confirmation: Mr. Wacha was asked to confirm the procedure and laterality before marking the site Procedure checklist: Completed Consent: Before the procedure and under the influence of no sedative(s), amnesic(s), or anxiolytics, the patient was informed of the treatment options, risks and possible complications. To fulfill our ethical and legal obligations, as recommended by the American Medical Association's Code of Ethics, I have informed the patient of my clinical impression; the nature and purpose of the treatment or procedure; the risks, benefits, and possible complications of the intervention; the alternatives, including doing nothing; the  risk(s) and benefit(s) of the alternative treatment(s) or procedure(s); and the risk(s) and benefit(s) of doing nothing.  Mr. Kleinsasser was provided with information about the general risks and possible complications associated with most interventional procedures. These include, but are not limited to: failure to achieve desired goals, infection, bleeding, organ or nerve damage, allergic reactions, paralysis, and/or death.  In addition, he was informed of those risks and possible complications associated to this particular procedure, which include, but are not limited to: damage to the implant; failure to decrease pain; local, systemic, or serious CNS infections, intraspinal abscess with possible cord compression and paralysis, or life-threatening such as meningitis; bleeding; organ damage; nerve injury or damage with subsequent sensory, motor, and/or autonomic system dysfunction, resulting in transient or permanent pain, numbness, and/or weakness of one or several areas of the body; allergic reactions, either minor or major life-threatening, such as anaphylactic or anaphylactoid reactions.  Furthermore, Mr. Lobos was informed of those risks and complications associated with the medications. These include, but are not limited to: allergic reactions (i.e.: anaphylactic or anaphylactoid reactions); endorphine suppression; bradycardia and/or hypotension; water retention and/or peripheral vascular relaxation leading to lower extremity edema and possible stasis ulcers; respiratory depression and/or shortness of breath; decreased metabolic rate leading to weight gain; swelling or edema; medication-induced neural toxicity; particulate matter embolism and blood vessel occlusion with resultant organ, and/or nervous system infarction; and/or intrathecal granuloma formation with possible spinal cord compression and permanent paralysis.  Before refilling the pump Mr. Cantave was informed that some of the medications used in  the devise may not be FDA approved for such use and therefore it constitutes an off-label use of the medications.  Finally, he was informed that Medicine is not an exact science; therefore, there is also the possibility of unforeseen or unpredictable risks and/or possible complications that may result in a catastrophic outcome. The patient indicated having understood very clearly. We have given the patient no guarantees and we have made no promises. Enough time was given to the patient to ask questions, all of which were answered to the patient's satisfaction. Mr. Raver has indicated that he wanted to continue with the procedure. Attestation: I, the ordering provider, attest that I have discussed with the patient the benefits, risks, side-effects, alternatives, likelihood of achieving goals, and potential problems during recovery for the procedure that I have provided informed consent. Date  Time: 07/19/2020 11:56 AM  Pre-Procedure Preparation:  Monitoring: As per clinic protocol. Respiration, ETCO2, SpO2, BP, heart rate and rhythm monitor placed and checked for adequate function Safety Precautions: Patient was assessed for positional comfort and pressure points before starting the procedure. Time-out: I initiated and conducted the "Time-out" before starting the procedure, as per protocol. The patient was asked to participate by confirming the accuracy of the "Time Out" information. Verification of the correct person, site, and procedure  were performed and confirmed by me, the nursing staff, and the patient. "Time-out" conducted as per Joint Commission's Universal Protocol (UP.01.01.01). Time: 1208  Description of Procedure:          Position: Supine Target Area: Central-port of intrathecal pump. Approach: Anterior, 90 degree angle approach. Area Prepped: Entire Area around the pump implant. DuraPrep (Iodine Povacrylex [0.7% available iodine] and Isopropyl Alcohol, 74% w/w) Safety Precautions:  Aspiration looking for blood return was conducted prior to all injections. At no point did we inject any substances, as a needle was being advanced. No attempts were made at seeking any paresthesias. Safe injection practices and needle disposal techniques used. Medications properly checked for expiration dates. SDV (single dose vial) medications used. Description of the Procedure: Protocol guidelines were followed. Two nurses trained to do implant refills were present during the entire procedure. The refill medication was checked by both healthcare providers as well as the patient. The patient was included in the "Time-out" to verify the medication. The patient was placed in position. The pump was identified. The area was prepped in the usual manner. The sterile template was positioned over the pump, making sure the side-port location matched that of the pump. Both, the pump and the template were held for stability. The needle provided in the Medtronic Kit was then introduced thru the center of the template and into the central port.  However, after several unfruitful attempts I was called in to access the pump.  They are increasing difficulty had to do with the fact that the patient indicated having gained more weight lately.  I was able to access the pump and the second attempt.  The pump content was aspirated and discarded volume documented. The new medication was slowly infused into the pump, thru the filter, making sure to avoid overpressure of the device. The needle was then removed and the area cleansed, making sure to leave some of the prepping solution back to take advantage of its long term bactericidal properties. The pump was interrogated and programmed to reflect the correct medication, volume, and dosage. The program was printed and taken to the physician for approval. Once checked and signed by the physician, a copy was provided to the patient and another scanned into the EMR. Vitals:   07/19/20  1155  BP: 139/66  Pulse: 64  Resp: 16  Temp: (!) 97.2 F (36.2 C)  TempSrc: Temporal  SpO2: 98%  Weight: 242 lb (109.8 kg)  Height: _0  (1.626 m)    Start Time: 1220 hrs. End Time: 1225 hrs. Materials & Medications: Medtronic Refill Kit Medication(s): Please see chart orders for details.  Imaging Guidance:          Type of Imaging Technique: None used Indication(s): N/A Exposure Time: No patient exposure Contrast: None used. Fluoroscopic Guidance: N/A Ultrasound Guidance: N/A Interpretation: N/A  Antibiotic Prophylaxis:   Anti-infectives (From admission, onward)   None     Indication(s): None identified  Post-operative Assessment:  Post-procedure Vital Signs:  Pulse/HCG Rate: 64  Temp: (!) 97.2 F (36.2 C) Resp: 16 BP: 139/66 SpO2: 98 %  EBL: None  Complications: No immediate post-treatment complications observed by team, or reported by patient.  Note: The patient tolerated the entire procedure well. A repeat set of vitals were taken after the procedure and the patient was kept under observation following institutional policy, for this type of procedure. Post-procedural neurological assessment was performed, showing return to baseline, prior to discharge. The patient was provided with post-procedure discharge  instructions, including a section on how to identify potential problems. Should any problems arise concerning this procedure, the patient was given instructions to immediately contact us, at any time, without hesitation. In any case, we plan to contact the patient by telephone for a follow-up status report regarding this interventional procedure.  Comments:  No additional relevant information.  Plan of Care  Orders:  No orders of the defined types were placed in this encounter.  Chronic Opioid Analgesic:  Intrathecal Hydromorphone (Dilaudid) @ 2.399 mg/d. MME: 9.6 mg/day.   Medications ordered for procedure: No orders of the defined types were placed  in this encounter.  Medications administered: Melton Walls had no medications administered during this visit.  See the medical record for exact dosing, route, and time of administration.  Follow-up plan:   Return for Pump Refill (Max:34mo.       Interventional management options: Planned, scheduled, and/or pending:      Considering:   NOTE: ELIQUIS ANTICOAGULATION (Stop: 3days  Re-start: 6 hrs) Diagnostic bilateral lumbar facet blocks #1    Palliative PRN treatment(s):   Continue management of intrathecal pump      Recent Visits Date Type Provider Dept  05/11/20 Procedure visit NMilinda Pointer MD [facility]  Showing recent visits within past 90 days and meeting all other requirements Today's Visits Date Type Provider Dept  07/19/20 Procedure visit NMilinda Pointer MD [facility]  Showing today's visits and meeting all other requirements Future Appointments Date Type Provider Dept  09/25/20 Appointment NMilinda Pointer MD [facility]  Showing future appointments within next 90 days and meeting all other requirements  Disposition: Discharge home  Discharge (Date  Time): 07/19/2020; 1250 hrs.   Primary Care Physician: Pcp, No [facility] Note by: FGaspar Cola MD Date: 07/19/2020; Time: 1:00 PM  Disclaimer:  Medicine is not an eChief Strategy Officer The only guarantee in medicine is that nothing is guaranteed. It is important to note that the decision to proceed with this intervention was based on the information collected from the patient. The Data and conclusions were drawn from the patient's questionnaire, the interview, and the physical examination. Because the information was provided in large part by the patient, it cannot be guaranteed that it has not been purposely or unconsciously manipulated. Every effort has been made to obtain as much relevant data as possible for this evaluation. It is  important to note that the conclusions that lead to this procedure are derived in large part from the available data. Always take into account that the treatment will also be dependent on availability of resources and existing treatment guidelines, considered by other Pain Management Practitioners as being common knowledge and practice, at the time of the intervention. For Medico-Legal purposes, it is also important to point out that variation in procedural techniques and pharmacological choices are the acceptable norm. The indications, contraindications, technique, and results of the above procedure should only be interpreted and judged by a Board-Certified Interventional Pain Specialist with extensive familiarity and expertise in the same exact procedure and technique.

## 2020-07-19 ENCOUNTER — Ambulatory Visit: Payer: Worker's Compensation | Attending: Pain Medicine | Admitting: Pain Medicine

## 2020-07-19 ENCOUNTER — Encounter: Payer: Self-pay | Admitting: Pain Medicine

## 2020-07-19 ENCOUNTER — Other Ambulatory Visit: Payer: Self-pay

## 2020-07-19 VITALS — BP 139/66 | HR 64 | Temp 97.2°F | Resp 16 | Ht 64.0 in | Wt 242.0 lb

## 2020-07-19 DIAGNOSIS — M4326 Fusion of spine, lumbar region: Secondary | ICD-10-CM | POA: Insufficient documentation

## 2020-07-19 DIAGNOSIS — M5137 Other intervertebral disc degeneration, lumbosacral region: Secondary | ICD-10-CM

## 2020-07-19 DIAGNOSIS — M5441 Lumbago with sciatica, right side: Secondary | ICD-10-CM | POA: Diagnosis present

## 2020-07-19 DIAGNOSIS — G894 Chronic pain syndrome: Secondary | ICD-10-CM | POA: Diagnosis present

## 2020-07-19 DIAGNOSIS — G8929 Other chronic pain: Secondary | ICD-10-CM | POA: Diagnosis present

## 2020-07-19 DIAGNOSIS — Z451 Encounter for adjustment and management of infusion pump: Secondary | ICD-10-CM | POA: Diagnosis present

## 2020-07-19 DIAGNOSIS — M79604 Pain in right leg: Secondary | ICD-10-CM | POA: Diagnosis present

## 2020-07-19 DIAGNOSIS — M961 Postlaminectomy syndrome, not elsewhere classified: Secondary | ICD-10-CM | POA: Diagnosis not present

## 2020-07-19 DIAGNOSIS — Z978 Presence of other specified devices: Secondary | ICD-10-CM | POA: Insufficient documentation

## 2020-07-19 NOTE — Patient Instructions (Signed)
Opioid Overdose Opioids are drugs that are often used to treat pain. Opioids include illegal drugs, such as heroin, as well as prescription pain medicines, such as codeine, morphine, hydrocodone, oxycodone, and fentanyl. An opioid overdose happens when you take too much of an opioid. An overdose may be intentional or accidental and can happen with any type of opioid. The effects of an overdose can be mild, dangerous, or even deadly. Opioid overdose is a medical emergency. What are the causes? This condition may be caused by:  Taking too much of an opioid on purpose.  Taking too much of an opioid by accident.  Using two or more substances that contain opioids at the same time.  Taking an opioid with a substance that affects your heart, breathing, or blood pressure. These include alcohol, tranquilizers, sleeping pills, illegal drugs, and some over-the-counter medicines. This condition may also happen due to an error made by:  A health care provider who prescribes a medicine.  The pharmacist who fills the prescription order. What increases the risk? This condition is more likely in:  Children. They may be attracted to colorful pills. Because of a child's small size, even a small amount of a drug can be dangerous.  Older people. They may be taking many different drugs. Older people may have difficulty reading labels or remembering when they last took their medicine. They may also be more sensitive to the effects of opioids.  People with chronic medical conditions, especially heart, liver, kidney, or neurological diseases.  People who take an opioid for a long period of time.  People who use: ? Illegal drugs. IV heroin is especially dangerous. ? Other substances, including alcohol, while using an opioid.  People who have: ? A history of drug or alcohol abuse. ? Certain mental health conditions. ? A history of previous drug overdoses.  People who take opioids that are not prescribed  for them. What are the signs or symptoms? Symptoms of this condition depend on the type of opioid and the amount that was taken. Common symptoms include:  Sleepiness or difficulty waking from sleep.  Decrease in attention.  Confusion.  Slurred speech.  Slowed breathing and a slow pulse (bradycardia).  Nausea and vomiting.  Abnormally small pupils. Signs and symptoms that require emergency treatment include:  Cold, clammy, and pale skin.  Blue lips and fingernails.  Vomiting.  Gurgling sounds in the throat.  A pulse that is very slow or difficult to detect.  Breathing that is very irregular, slow, noisy, or difficult to detect.  Limp body.  Inability to respond to speech or be awakened from sleep (stupor).  Seizures. How is this diagnosed? This condition is diagnosed based on your symptoms and medical history. It is important to tell your health care provider:  About all of the opioids that you took.  When you took the opioids.  Whether you were drinking alcohol or using marijuana, cocaine, or other drugs. Your health care provider will do a physical exam. This exam may include:  Checking and monitoring your heart rate and rhythm, breathing rate, temperature, and blood pressure (vital signs).  Measuring oxygen levels in your blood.  Checking for abnormally small pupils. You may also have blood tests or urine tests. You may have X-rays if you are having severe breathing problems. How is this treated? This condition requires immediate medical treatment and hospitalization. Treatment is given in the hospital intensive care (ICU) setting. Supporting your vital signs and your breathing is the first step in   treating an opioid overdose. Treatment may also include:  Giving salts and minerals (electrolytes) along with fluids through an IV.  Inserting a breathing tube (endotracheal tube) in your airway to help you breathe if you cannot breathe on your own or you are in  danger of not being able to breathe on your own.  Giving oxygen through a small tube under your nose.  Passing a tube through your nose and into your stomach (nasogastric tube, or NG tube) to empty your stomach.  Giving medicines that: ? Increase your blood pressure. ? Relieve nausea and vomiting. ? Relieve abdominal pain and cramping. ? Reverse the effects of the opioid (naloxone).  Monitoring your heart and oxygen levels.  Ongoing counseling and mental health support if you intentionally overdosed or used an illegal drug. Follow these instructions at home: Medicines  Take over-the-counter and prescription medicines only as told by your health care provider.  Always ask your health care provider about possible side effects and interactions of any new medicine that you start taking.  Keep a list of all the medicines that you take, including over-the-counter medicines. Bring this list with you to all your medical visits. General instructions  Drink enough fluid to keep your urine pale yellow.  Keep all follow-up visits as told by your health care provider. This is important.   How is this prevented?  Read the drug inserts that come with your opioid pain medicines.  Take medicines only as told by your health care provider. Do not take more medicine than you are told. Do not take medicines more frequently than you are told.  Do not drink alcohol or take sedatives when taking opioids.  Do not use illegal or recreational drugs, including cocaine, ecstasy, and marijuana.  Do not take opioid medicines that are not prescribed for you.  Store all medicines in safety containers that are out of the reach of children.  Get help if you are struggling with: ? Alcohol or drug use. ? Depression or another mental health problem. ? Thoughts of hurting yourself or another person.  Keep the phone number of your local poison control center near your phone or in your mobile phone. In the  [place] the hotline of the National Poison Control Center is (800) 222-1222.  If you were prescribed naloxone, make sure you understand how to take it. Contact a health care provider if you:  Need help understanding how to take your pain medicines.  Feel your medicines are too strong.  Are concerned that your pain medicines are not working well for your pain.  Develop new symptoms or side effects when you are taking medicines. Get help right away if:  You or someone else is having symptoms of an opioid overdose. Get help even if you are not sure.  You have serious thoughts about hurting yourself or others.  You have: ? Chest pain. ? Difficulty breathing. ? A loss of consciousness. These symptoms may represent a serious problem that is an emergency. Do not wait to see if the symptoms will go away. Get medical help right away. Call your local emergency services (911 in the [place] Do not drive yourself to the hospital. If you ever feel like you may hurt yourself or others, or have thoughts about taking your own life, get help right away. You can go to your nearest emergency department or call:  Your local emergency services (911 in the [place]  A suicide crisis helpline, such as the National Suicide Prevention Lifeline   at 1-800-273-8255. This is open 24 hours a day. Summary  Opioids are drugs that are often used to treat pain. Opioids include illegal drugs, such as heroin, as well as prescription pain medicines.  An opioid overdose happens when you take too much of an opioid.  Overdoses can be intentional or accidental.  Opioid overdose is very dangerous. It is a life-threatening emergency.  If you or someone you know is experiencing an opioid overdose, get help right away. This information is not intended to replace advice given to you by your health care provider. Make sure you discuss any questions you have with your health care provider. Document Revised: 01/21/2018 Document  Reviewed: 01/21/2018 [employer] Patient Education  2021 [employer]  

## 2020-07-19 NOTE — Progress Notes (Signed)
Safety precautions to be maintained throughout the outpatient stay will include: orient to surroundings, keep bed in low position, maintain call bell within reach at all times, provide assistance with transfer out of bed and ambulation.  

## 2020-07-20 ENCOUNTER — Telehealth: Payer: Self-pay

## 2020-07-20 NOTE — Telephone Encounter (Signed)
Post procedure IT pump refill phone call.  Patient states he is doing well.

## 2020-07-24 MED FILL — Medication: INTRATHECAL | Qty: 1 | Status: AC

## 2020-08-13 ENCOUNTER — Telehealth: Payer: Self-pay | Admitting: Pain Medicine

## 2020-08-13 NOTE — Telephone Encounter (Signed)
Patient informed ok to have pump filled on 09-25-20.

## 2020-08-13 NOTE — Telephone Encounter (Signed)
Patient states he is having his pump battery replaced by Dr. Lacinda Axon on Aug 20. His pump refill is Aug 9. Wants to make sure this is not going to be a problem. Please call patient to confirm.

## 2020-08-21 DIAGNOSIS — Z902 Acquired absence of lung [part of]: Secondary | ICD-10-CM | POA: Insufficient documentation

## 2020-08-28 ENCOUNTER — Other Ambulatory Visit: Payer: Self-pay | Admitting: Neurosurgery

## 2020-09-03 ENCOUNTER — Other Ambulatory Visit: Payer: Self-pay

## 2020-09-03 MED ORDER — PAIN MANAGEMENT IT PUMP REFILL: 1.0000 | Freq: Once | INTRATHECAL | 0 refills | Status: AC

## 2020-09-03 NOTE — Progress Notes (Signed)
It

## 2020-09-24 NOTE — Progress Notes (Signed)
PROVIDER NOTE: Information contained herein reflects review and annotations entered in association with encounter. Interpretation of such information and data should be left to medically-trained personnel. Information provided to patient can be located elsewhere in the medical record under "Patient Instructions". Document created using STT-dictation technology, any transcriptional errors that may result from process are unintentional.    Patient: Brian Spencer  Service Category: Procedure  Provider: Gaspar Cola, MD  DOB: 09-09-1950  DOS: 09/25/2020  Location: [facility]  MRN: [id number]  Setting: Ambulatory - outpatient  Referring Provider: No ref. provider found  Type: Established Patient  Specialty: Interventional Pain Management  PCP: Pcp, No   Primary Reason for Visit: Interventional Pain Management Treatment. CC: Back Pain (low)  Procedure:          Intrathecal Drug Delivery System (IDDS):  Type: Reservoir Refill [phone]) No rate change Region: Abdominal Laterality: Right  Type of Pump: Medtronic Synchromed II Delivery Route: Intrathecal Type of Pain Treated: Neuropathic/Nociceptive Primary Medication Class: Opioid/opiate  Medication, Concentration, Infusion Program, & Delivery Rate: Please see scanned programming printout.   Indications: 1. Failed back surgical syndrome   2. Chronic low back pain (1ry area of Pain) (Bilateral) (R>L)   3. Chronic lower extremity pain (2ry area of Pain) (Right)   4. DDD (degenerative disc disease), lumbosacral   5. Fusion of lumbar spine   6. Chronic pain syndrome   7. Presence of intrathecal pump   8. Encounter for adjustment or management of infusion pump   9. End of battery life of intrathecal infusion pump   10. Pharmacologic therapy   11. Chronic anticoagulation (Eliquis)   12. Encounter for therapeutic procedure   13. Encounter for medication management   14. Encounter for chronic pain management    Pain  Assessment: Self-Reported Pain Score: 3 /10             Reported level is compatible with observation.        Intrathecal pump needed replacement due to battery, by June 2022.  Patient referred to neurosurgery for pump replacement.  If at all possible, will like to keep it to a 40 mL pump.  Currently the patient has preservative-free bupivacaine 10 mg/mL + preservative-free clonidine 50 mcg/mL + preservative-free hydromorphone 2.5 mg/mL on a 40 mL pump.  We will continue with the same mixture and we have the pump running on a simple continuous infusion at 1.2276 mg/day of the hydromorphone.  Pharmacotherapy Assessment   Analgesic: Intrathecal Hydromorphone (Dilaudid) @ 2.399 mg/d. MME: 9.6 mg/day.   Monitoring: [place] PMP: PDMP reviewed during this encounter.       Pharmacotherapy: No side-effects or adverse reactions reported. Compliance: No problems identified. Effectiveness: Clinically acceptable. Plan: Refer to "POC". UDS:  Summary  Date Value Ref Range Status  11/12/2015 FINAL  Final    Comment:    ==================================================================== [facility] COMP DRUG ANALYSIS,UR ==================================================================== Test                             Result       Flag       Units Drug Present and Declared for Prescription Verification   7-aminoclonazepam              82           EXPECTED   ng/mg creat    7-aminoclonazepam is an expected metabolite of clonazepam. Source    of clonazepam is a scheduled prescription medication.  Trazodone                      PRESENT      EXPECTED   1,3 chlorophenyl piperazine    PRESENT      EXPECTED    1,3-chlorophenyl piperazine is an expected metabolite of    trazodone.   Verapamil                      PRESENT      EXPECTED Drug Present not Declared for Prescription Verification   Hydromorphone                  1656         UNEXPECTED ng/mg creat    Hydromorphone may be administered as a  scheduled prescription    medication; it is also an expected metabolite of hydrocodone. Drug Absent but Declared for Prescription Verification   Venlafaxine                    Not Detected UNEXPECTED ==================================================================== Test                      Result    Flag   Units      Ref Range   Creatinine              73               mg/dL      >=20 ==================================================================== Declared Medications:  The flagging and interpretation on this report are based on the  following declared medications.  Unexpected results may arise from  inaccuracies in the declared medications.  **Note: The testing scope of this panel includes these medications:  Clonazepam (Klonopin)  Trazodone (Desyrel)  Venlafaxine (Effexor)  Verapamil (Calan)  **Note: The testing scope of this panel does not include following  reported medications:  Albuterol  Budenoside (Symbicort)  Chlorthalidone  Formoterol (Symbicort)  Multivitamin  Tamsulosin (Flomax)  Tiotropium (Spiriva)  Ubiquinone (Coenzyme Q 10)  Vitamin D2 (Ergocalciferol) ==================================================================== For clinical consultation, please call [phone]. ====================================================================      Intrathecal Pump Therapy Assessment  Manufacturer: Medtronic Synchromed Type: Programmable Volume: 40 mL reservoir MRI compatibility: Yes   Drug content:  Primary Medication Class: Opioid Primary Medication: PF-Hydromorphone (Dilaudid) (2.5 mg/mL)  Secondary Medication: PF-Bupivacaine (10 mg/mL)  Other Medication: PF-Clonidine (50 mcg/mL)    Programming:  Type: Simple continuous. See pump readout for details.  (1.2276 mg/day of hydromorphone)  Changes:  Medication Change: None at this point Rate Change: No change in rate  Reported side-effects or adverse reactions: None reported  Effectiveness:  Described as relatively effective, allowing for increase in activities of daily living (ADL) Clinically meaningful improvement in function (CMIF): Sustained CMIF goals met  Plan: Pump refill today  Pre-op H&P Assessment:  Mr. Dasch is a 70 y.o. (year old), male patient, seen today for interventional treatment. 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 Mr. Havey has a current medication list which includes the following prescription(s): acetaminophen, albuterol, albuterol, amlodipine, apixaban, atorvastatin, clonazepam, neuriva plus, coq10, furosemide, magnesium oxide, multivitamin, NON FORMULARY, oxygen-helium, pantoprazole, polyvinyl alcohol, propranolol, tamsulosin, tiotropium bromide-olodaterol, trazodone, and venlafaxine xr. His primarily concern today is the Back Pain (low)  Initial Vital Signs:  Pulse/HCG Rate: 64  Temp: (!) 97 F (36.1 C) Resp: 18 BP: 128/71 SpO2: 94 %  BMI: Estimated body mass index is 40.27 kg/m as calculated from the following:   Height as of this encounter: _0  (1.651 m).   Weight as of this encounter: 242 lb (109.8 kg).  Risk Assessment: Allergies: Reviewed. He is allergic to baclofen and hydralazine.   Allergy Precautions: None required Coagulopathies: Reviewed. None identified.  Blood-thinner therapy: None at this time Active Infection(s): Reviewed. None identified. Mr. Schoffstall is afebrile  Site Confirmation: Mr. Bocock was asked to confirm the procedure and laterality before marking the site Procedure checklist: Completed Consent: Before the procedure and under the influence of no sedative(s), amnesic(s), or anxiolytics, the patient was informed of the treatment options, risks and possible complications. To fulfill our ethical and legal obligations, as recommended by the American Medical Association's Code of Ethics, I have informed the patient of my clinical impression; the nature and purpose of the treatment or procedure; the risks, benefits, and possible complications of the intervention; the alternatives, including doing nothing; the risk(s) and benefit(s) of the alternative treatment(s) or procedure(s); and the risk(s) and benefit(s) of doing nothing.  Mr. Coldren was provided with information about the general risks and possible complications associated with most interventional procedures. These include, but are not limited to: failure to achieve desired goals, infection, bleeding, organ or nerve damage, allergic reactions, paralysis, and/or death.  In addition, he was informed of those risks and possible complications associated to this particular procedure, which include, but are not limited to: damage to the implant; failure to decrease pain; local, systemic, or serious CNS infections, intraspinal abscess with possible cord compression and paralysis, or life-threatening such as meningitis; bleeding; organ damage; nerve injury or damage with subsequent sensory, motor, and/or autonomic system dysfunction, resulting in transient or permanent pain, numbness, and/or weakness of one or several areas of the body; allergic reactions, either minor or major life-threatening, such as anaphylactic or  anaphylactoid reactions.  Furthermore, Mr. Milne was informed of those risks and complications associated with the medications. These include, but are not limited to: allergic reactions (i.e.: anaphylactic or anaphylactoid reactions); endorphine suppression; bradycardia and/or hypotension; water retention and/or peripheral vascular relaxation leading to lower extremity edema and possible stasis ulcers; respiratory depression and/or shortness of breath; decreased metabolic rate leading to weight gain; swelling or edema; medication-induced neural toxicity; particulate matter embolism and blood vessel occlusion with resultant organ, and/or nervous system infarction; and/or intrathecal granuloma formation with possible spinal cord compression and permanent paralysis.  Before refilling the pump Mr. Barfield was informed that some of the medications used in the devise may not be FDA approved for such use and therefore it constitutes an off-label use of the medications.  Finally, he was informed that Medicine is not an exact science; therefore, there is also the possibility of unforeseen or unpredictable risks and/or possible complications that may result in a catastrophic outcome. The patient indicated having understood very clearly. We have given the patient no guarantees and we have made no promises. Enough time was given to the patient to ask questions, all  of which were answered to the patient's satisfaction. Mr. Silguero has indicated that he wanted to continue with the procedure. Attestation: I, the ordering provider, attest that I have discussed with the patient the benefits, risks, side-effects, alternatives, likelihood of achieving goals, and potential problems during recovery for the procedure that I have provided informed consent. Date  Time: 09/25/2020 11:06 AM  Pre-Procedure Preparation:  Monitoring: As per clinic protocol. Respiration, ETCO2, SpO2, BP, heart rate and rhythm monitor placed and checked for  adequate function Safety Precautions: Patient was assessed for positional comfort and pressure points before starting the procedure. Time-out: I initiated and conducted the "Time-out" before starting the procedure, as per protocol. The patient was asked to participate by confirming the accuracy of the "Time Out" information. Verification of the correct person, site, and procedure were performed and confirmed by me, the nursing staff, and the patient. "Time-out" conducted as per Joint Commission's Universal Protocol (UP.01.01.01). Time: 1113  Description of Procedure:          Position: Supine Target Area: Central-port of intrathecal pump. Approach: Anterior, 90 degree angle approach. Area Prepped: Entire Area around the pump implant. DuraPrep (Iodine Povacrylex [0.7% available iodine] and Isopropyl Alcohol, 74% w/w) Safety Precautions: Aspiration looking for blood return was conducted prior to all injections. At no point did we inject any substances, as a needle was being advanced. No attempts were made at seeking any paresthesias. Safe injection practices and needle disposal techniques used. Medications properly checked for expiration dates. SDV (single dose vial) medications used. Description of the Procedure: Protocol guidelines were followed. Two nurses trained to do implant refills were present during the entire procedure. The refill medication was checked by both healthcare providers as well as the patient. The patient was included in the "Time-out" to verify the medication. The patient was placed in position. The pump was identified. The area was prepped in the usual manner. The sterile template was positioned over the pump, making sure the side-port location matched that of the pump. Both, the pump and the template were held for stability. The needle provided in the Medtronic Kit was then introduced thru the center of the template and into the central port. The pump content was aspirated and  discarded volume documented. The new medication was slowly infused into the pump, thru the filter, making sure to avoid overpressure of the device. The needle was then removed and the area cleansed, making sure to leave some of the prepping solution back to take advantage of its long term bactericidal properties. The pump was interrogated and programmed to reflect the correct medication, volume, and dosage. The program was printed and taken to the physician for approval. Once checked and signed by the physician, a copy was provided to the patient and another scanned into the EMR. Vitals:   09/25/20 1104  BP: 128/71  Pulse: 64  Resp: 18  Temp: (!) 97 F (36.1 C)  SpO2: 94%  Weight: 242 lb (109.8 kg)  Height: _0  (1.651 m)    Start Time: 1113 hrs. End Time: 1139 hrs. Materials & Medications: Medtronic Refill Kit Medication(s): Please see chart orders for details.  Imaging Guidance:          Type of Imaging Technique: None used Indication(s): N/A Exposure Time: No patient exposure Contrast: None used. Fluoroscopic Guidance: N/A Ultrasound Guidance: N/A Interpretation: N/A  Antibiotic Prophylaxis:   Anti-infectives (From admission, onward)    None      Indication(s): None identified  Post-operative Assessment:  Post-procedure Vital Signs:  Pulse/HCG Rate: 64  Temp: (!) 97 F (36.1 C) Resp: 18 BP: 128/71 SpO2: 94 %  EBL: None  Complications: No immediate post-treatment complications observed by team, or reported by patient.  Note: The patient tolerated the entire procedure well. A repeat set of vitals were taken after the procedure and the patient was kept under observation following institutional policy, for this type of procedure. Post-procedural neurological assessment was performed, showing return to baseline, prior to discharge. The patient was provided with post-procedure discharge instructions, including a section on how to identify potential problems. Should any  problems arise concerning this procedure, the patient was given instructions to immediately contact us, at any time, without hesitation. In any case, we plan to contact the patient by telephone for a follow-up status report regarding this interventional procedure.  Comments:  No additional relevant information.  Plan of Care  Orders:  Orders Placed This Encounter  Procedures   PUMP REFILL    Maintain Protocol by having two(2) healthcare providers during procedure and programming.    Scheduling Instructions:     Please refill intrathecal pump today.    Order Specific Question:   Where will this procedure be performed?    Answer:   [facility]   PUMP REFILL    Whenever possible schedule on a procedure today.    Standing Status:   Future    Standing Expiration Date:   02/22/2021    Scheduling Instructions:     Please schedule intrathecal pump refill based on pump programming. Avoid schedule intervals of more than 120 days (4 months).    Order Specific Question:   Where will this procedure be performed?    Answer:   [facility]   Informed Consent Details: Physician/Practitioner Attestation; Transcribe to consent form and obtain patient signature    Transcribe to consent form and obtain patient signature.    Order Specific Question:   Physician/Practitioner attestation of informed consent for procedure/surgical case    Answer:   I, the physician/practitioner, attest that I have discussed with the patient the benefits, risks, side effects, alternatives, likelihood of achieving goals and potential problems during recovery for the procedure that I have provided informed consent.    Order Specific Question:   Procedure    Answer:   Intrathecal pump refill    Order Specific Question:   Physician/Practitioner performing the procedure    Answer:   Attending Physician: Kathlen Brunswick. Dossie Arbour, MD & designated trained staff    Order Specific Question:   Indication/Reason    Answer:    Chronic Pain Syndrome (G89.4), presence of an intrathecal pump (Z97.8)    Chronic Opioid Analgesic:  Intrathecal Hydromorphone (Dilaudid) @ 2.399 mg/d. MME: 9.6 mg/day.   Medications ordered for procedure: No orders of the defined types were placed in this encounter.  Medications administered: Justinian Miano had no medications administered during this visit.  See the medical record for exact dosing, route, and time of administration.  Follow-up plan:   Return for Pump Refill (Max:56mo.      Interventional management options: Planned, scheduled, and/or pending:      Considering:   NOTE: ELIQUIS ANTICOAGULATION (Stop: 3days  Re-start: 6 hrs) Diagnostic bilateral lumbar facet blocks #1    Palliative PRN treatment(s):   Continue management of intrathecal pump       Recent Visits Date Type Provider Dept  07/19/20 Procedure visit NMilinda Pointer MD [facility]  Showing recent visits within past 90 days  and meeting all other requirements Today's Visits Date Type Provider Dept  09/25/20 Procedure visit Milinda Pointer, MD [facility]  Showing today's visits and meeting all other requirements Future Appointments Date Type Provider Dept  12/04/20 Appointment Milinda Pointer, MD [facility]  Showing future appointments within next 90 days and meeting all other requirements Disposition: Discharge home  Discharge (Date  Time): 09/25/2020; 1200 hrs.   Primary Care Physician: Pcp, No Location: [facility] Note by: Gaspar Cola, MD Date: 09/25/2020; Time: 1:38 PM  Disclaimer:  Medicine is not an Chief Strategy Officer. The only guarantee in medicine is that nothing is guaranteed. It is important to note that the decision to proceed with this intervention was based on the information collected from the patient. The Data and conclusions were drawn from the patient's questionnaire, the interview, and the physical examination.  Because the information was provided in large part by the patient, it cannot be guaranteed that it has not been purposely or unconsciously manipulated. Every effort has been made to obtain as much relevant data as possible for this evaluation. It is important to note that the conclusions that lead to this procedure are derived in large part from the available data. Always take into account that the treatment will also be dependent on availability of resources and existing treatment guidelines, considered by other Pain Management Practitioners as being common knowledge and practice, at the time of the intervention. For Medico-Legal purposes, it is also important to point out that variation in procedural techniques and pharmacological choices are the acceptable norm. The indications, contraindications, technique, and results of the above procedure should only be interpreted and judged by a Board-Certified Interventional Pain Specialist with extensive familiarity and expertise in the same exact procedure and technique.

## 2020-09-25 ENCOUNTER — Encounter: Payer: Self-pay | Admitting: Pain Medicine

## 2020-09-25 ENCOUNTER — Ambulatory Visit: Payer: Worker's Compensation | Attending: Pain Medicine | Admitting: Pain Medicine

## 2020-09-25 ENCOUNTER — Other Ambulatory Visit: Payer: Self-pay

## 2020-09-25 VITALS — BP 128/71 | HR 64 | Temp 97.0°F | Resp 18 | Ht 65.0 in | Wt 242.0 lb

## 2020-09-25 DIAGNOSIS — M961 Postlaminectomy syndrome, not elsewhere classified: Secondary | ICD-10-CM | POA: Diagnosis not present

## 2020-09-25 DIAGNOSIS — M79604 Pain in right leg: Secondary | ICD-10-CM | POA: Insufficient documentation

## 2020-09-25 DIAGNOSIS — M5441 Lumbago with sciatica, right side: Secondary | ICD-10-CM | POA: Insufficient documentation

## 2020-09-25 DIAGNOSIS — Z7901 Long term (current) use of anticoagulants: Secondary | ICD-10-CM | POA: Insufficient documentation

## 2020-09-25 DIAGNOSIS — G8929 Other chronic pain: Secondary | ICD-10-CM | POA: Diagnosis present

## 2020-09-25 DIAGNOSIS — Z79899 Other long term (current) drug therapy: Secondary | ICD-10-CM | POA: Insufficient documentation

## 2020-09-25 DIAGNOSIS — M5137 Other intervertebral disc degeneration, lumbosacral region: Secondary | ICD-10-CM | POA: Diagnosis present

## 2020-09-25 DIAGNOSIS — G894 Chronic pain syndrome: Secondary | ICD-10-CM | POA: Insufficient documentation

## 2020-09-25 DIAGNOSIS — Z462 Encounter for fitting and adjustment of other devices related to nervous system and special senses: Secondary | ICD-10-CM | POA: Diagnosis present

## 2020-09-25 DIAGNOSIS — M4326 Fusion of spine, lumbar region: Secondary | ICD-10-CM | POA: Insufficient documentation

## 2020-09-25 DIAGNOSIS — Z978 Presence of other specified devices: Secondary | ICD-10-CM | POA: Insufficient documentation

## 2020-09-25 DIAGNOSIS — Z451 Encounter for adjustment and management of infusion pump: Secondary | ICD-10-CM | POA: Diagnosis present

## 2020-09-25 DIAGNOSIS — Z5189 Encounter for other specified aftercare: Secondary | ICD-10-CM | POA: Insufficient documentation

## 2020-09-25 NOTE — Patient Instructions (Signed)
Patient lives at home alone.  Has Narcan on hand  Opioid Overdose Opioids are drugs that are often used to treat pain. Opioids include illegal drugs, such as heroin, as well as prescription pain medicines, such as codeine, morphine, hydrocodone, oxycodone, and fentanyl. An opioid overdose happens when you take too much of an opioid. An overdose may be intentional or accidentaland can happen with any type of opioid. The effects of an overdose can be mild, dangerous, or even deadly. Opioidoverdose is a medical emergency. What are the causes? This condition may be caused by: Taking too much of an opioid on purpose. Taking too much of an opioid by accident. Using two or more substances that contain opioids at the same time. Taking an opioid with a substance that affects your heart, breathing, or blood pressure. These include alcohol, tranquilizers, sleeping pills, illegal drugs, and some over-the-counter medicines. This condition may also happen due to an error made by: A health care provider who prescribes a medicine. The pharmacist who fills the prescription order. What increases the risk? This condition is more likely in: Children. They may be attracted to colorful pills. Because of a child's small size, even a small amount of a drug can be dangerous. Older people. They may be taking many different drugs. Older people may have difficulty reading labels or remembering when they last took their medicine. They may also be more sensitive to the effects of opioids. People with chronic medical conditions, especially heart, liver, kidney, or neurological diseases. People who take an opioid for a long period of time. People who use: Illegal drugs. IV heroin is especially dangerous. Other substances, including alcohol, while using an opioid. People who have: A history of drug or alcohol abuse. Certain mental health conditions. A history of previous drug overdoses. People who take opioids that  are not prescribed for them. What are the signs or symptoms? Symptoms of this condition depend on the type of opioid and the amount that was taken. Common symptoms include: Sleepiness or difficulty waking from sleep. Decrease in attention. Confusion. Slurred speech. Slowed breathing and a slow pulse (bradycardia). Nausea and vomiting. Abnormally small pupils. Signs and symptoms that require emergency treatment include: Cold, clammy, and pale skin. Blue lips and fingernails. Vomiting. Gurgling sounds in the throat. A pulse that is very slow or difficult to detect. Breathing that is very irregular, slow, noisy, or difficult to detect. Limp body. Inability to respond to speech or be awakened from sleep (stupor). Seizures. How is this diagnosed? This condition is diagnosed based on your symptoms and medical history. It is important to tell your health care provider: About all of the opioids that you took. When you took the opioids. Whether you were drinking alcohol or using marijuana, cocaine, or other drugs. Your health care provider will do a physical exam. This exam may include: Checking and monitoring your heart rate and rhythm, breathing rate, temperature, and blood pressure (vital signs). Measuring oxygen levels in your blood. Checking for abnormally small pupils. You may also have blood tests or urine tests. You may have X-rays if you arehaving severe breathing problems. How is this treated? This condition requires immediate medical treatment and hospitalization. Treatment is given in the hospital intensive care (ICU) setting. Supporting your vital signs and your breathing is the first step in treating an opioid overdose. Treatment may also include: Giving salts and minerals (electrolytes) along with fluids through an IV. Inserting a breathing tube (endotracheal tube) in your airway to help you  breathe if you cannot breathe on your own or you are in danger of not being able to  breathe on your own. Giving oxygen through a small tube under your nose. Passing a tube through your nose and into your stomach (nasogastric tube, or NG tube) to empty your stomach. Giving medicines that: Increase your blood pressure. Relieve nausea and vomiting. Relieve abdominal pain and cramping. Reverse the effects of the opioid (naloxone). Monitoring your heart and oxygen levels. Ongoing counseling and mental health support if you intentionally overdosed or used an illegal drug. Follow these instructions at home:  Medicines Take over-the-counter and prescription medicines only as told by your health care provider. Always ask your health care provider about possible side effects and interactions of any new medicine that you start taking. Keep a list of all the medicines that you take, including over-the-counter medicines. Bring this list with you to all your medical visits. General instructions Drink enough fluid to keep your urine pale yellow. Keep all follow-up visits as told by your health care provider. This is important. How is this prevented? Read the drug inserts that come with your opioid pain medicines. Take medicines only as told by your health care provider. Do not take more medicine than you are told. Do not take medicines more frequently than you are told. Do not drink alcohol or take sedatives when taking opioids. Do not use illegal or recreational drugs, including cocaine, ecstasy, and marijuana. Do not take opioid medicines that are not prescribed for you. Store all medicines in safety containers that are out of the reach of children. Get help if you are struggling with: Alcohol or drug use. Depression or another mental health problem. Thoughts of hurting yourself or another person. Keep the phone number of your local poison control center near your phone or in your mobile phone. In the [place] the hotline of the [facility] is (670) 712-9857. If  you were prescribed naloxone, make sure you understand how to take it. Contact a health care provider if you: Need help understanding how to take your pain medicines. Feel your medicines are too strong. Are concerned that your pain medicines are not working well for your pain. Develop new symptoms or side effects when you are taking medicines. Get help right away if: You or someone else is having symptoms of an opioid overdose. Get help even if you are not sure. You have serious thoughts about hurting yourself or others. You have: Chest pain. Difficulty breathing. A loss of consciousness. These symptoms may represent a serious problem that is an emergency. Do not wait to see if the symptoms will go away. Get medical help right away. Call your local emergency services (911 in the [place] Do not drive yourself to the hospital. If you ever feel like you may hurt yourself or others, or have thoughts about taking your own life, get help right away. You can go to your nearest emergency department or call: Your local emergency services (911 in the [place] A suicide crisis helpline, such as the [place] at 863-218-2205. This is open 24 hours a day. Summary Opioids are drugs that are often used to treat pain. Opioids include illegal drugs, such as heroin, as well as prescription pain medicines. An opioid overdose happens when you take too much of an opioid. Overdoses can be intentional or accidental. Opioid overdose is very dangerous. It is a life-threatening emergency. If you or someone you know is experiencing an opioid overdose,  get help right away. This information is not intended to replace advice given to you by your health care provider. Make sure you discuss any questions you have with your healthcare provider. Document Revised: 01/21/2018 Document Reviewed: 01/21/2018 Elsevier Patient Education  2022 [place]. Post-procedure Information What to  expect: Most procedures involve the use of a local anesthetic (numbing medicine), and a steroid (anti-inflammatory medicine).  The local anesthetics may cause temporary numbness and weakness of the legs or arms, depending on the location of the block. This numbness/weakness may last 4-6 hours, depending on the local anesthetic used. In rare instances, it can last up to 24 hours. While numb, you must be very careful not to injure the extremity.  After any procedure, you could expect the pain to get better within 15-20 minutes. This relief is temporary and may last 4-6 hours. Once the local anesthetics wears off, you could experience discomfort, possibly more than usual, for up to 10 (ten) days. In the case of radiofrequencies, it may last up to 6 weeks. Surgeries may take up to 8 weeks for the healing process. The discomfort is due to the irritation caused by needles going through skin and muscle. To minimize the discomfort, we recommend using ice the first day, and heat from then on. The ice should be applied for 15 minutes on, and 15 minutes off. Keep repeating this cycle until bedtime. Avoid applying the ice directly to the skin, to prevent frostbite. Heat should be used daily, until the pain improves (4-10 days). Be careful not to burn yourself.  Occasionally you may experience muscle spasms or cramps. These occur as a consequence of the irritation caused by the needle sticks to the muscle and the blood that will inevitably be lost into the surrounding muscle tissue. Blood tends to be very irritating to tissues, which tend to react by going into spasm. These spasms may start the same day of your procedure, but they may also take days to develop. This late onset type of spasm or cramp is usually caused by electrolyte imbalances triggered by the steroids, at the level of the kidney. Cramps and spasms tend to respond well to muscle relaxants, multivitamins (some are triggered by the procedure, but may have  their origins in vitamin deficiencies), and "Gatorade", or any sports drinks that can replenish any electrolyte imbalances. (If you are a diabetic, ask your pharmacist to get you a sugar-free brand.) Warm showers or baths may also be helpful. Stretching exercises are highly recommended. General Instructions:  Be alert for signs of possible infection: redness, swelling, heat, red streaks, elevated temperature, and/or fever. These typically appear 4 to 6 days after the procedure. Immediately notify your doctor if you experience unusual bleeding, difficulty breathing, or loss of bowel or bladder control. If you experience increased pain, do not increase your pain medicine intake, unless instructed by your pain physician. Post-Procedure Care:  Be careful in moving about. Muscle spasms in the area of the injection may occur. Applying ice or heat to the area is often helpful. The incidence of spinal headaches after epidural injections ranges between 1.4% and 6%. If you develop a headache that does not seem to respond to conservative therapy, please let your physician know. This can be treated with an epidural blood patch.   Post-procedure numbness or redness is to be expected, however it should average 4 to 6 hours. If numbness and weakness of your extremities begins to develop 4 to 6 hours after your procedure, and is  felt to be progressing and worsening, immediately contact your physician.   Diet:  If you experience nausea, do not eat until this sensation goes away. If you had a "Stellate Ganglion Block" for upper extremity "Reflex Sympathetic Dystrophy", do not eat or drink until your hoarseness goes away. In any case, always start with liquids first and if you tolerate them well, then slowly progress to more solid foods. Activity:  For the first 4 to 6 hours after the procedure, use caution in moving about as you may experience numbness and/or weakness. Use caution in cooking, using household electrical  appliances, and climbing steps. If you need to reach your Doctor call our office: [phone] Monday-Thursday 8:00 am - 4:00 PM    Fridays: Closed     In case of an emergency: In case of emergency, call 911 or go to the nearest emergency room and have the physician there call us.  Interpretation of Procedure Every nerve block has two components: a diagnostic component, and a treatment component. Unrealistic expectations are the most common causes of "perceived failure".  In a perfect world, a single nerve block should be able to completely and permanently eliminate the pain. Sadly, the world is not perfect.  Most pain management nerve blocks are performed using local anesthetics and steroids. Steroids are responsible for any long-term benefit that you may experience. Their purpose is to decrease any chronic swelling that may exist in the area. Steroids begin to work immediately after being injected. However, most patients will not experience any benefits until 5 to 10 days after the injection, when the swelling has come down to the point where they can tell a difference. Steroids will only help if there is swelling to be treated. As such, they can assist with the diagnosis. If effective, they suggest an inflammatory component to the pain, and if ineffective, they rule out inflammation as the main cause or component of the problem. If the problem is one of mechanical compression, you will get no benefit from those steroids.   In the case of local anesthetics, they have a crucial role in the diagnosis of your condition. Most will begin to work within15 to 20 minutes after injection. The duration will depend on the type used (short- vs. Long-acting). It is of outmost importance that patients keep tract of their pain, after the procedure. To assist with this matter, a "Post-procedure Pain Diary" is provided. Make sure to complete it and to bring it back to your follow-up appointment.  As long as the  patient keeps accurate, detailed records of their symptoms after every procedure, and returns to have those interpreted, every procedure will provide [place] with invaluable information. Even a block that does not provide the patient with any relief, will always provide [place] with information about the mechanism and the origin of the pain. The only time a nerve block can be considered a waste of time is when patients do not keep track of the results, or do not keep their post-procedure appointment.  Reporting the results back to your physician The Pain Score  Pain is a subjective complaint. It cannot be seen, touched, or measured. We depend entirely on the patient's report of the pain in order to assess your condition and treatment. To evaluate the pain, we use a pain scale, where "0" means "No Pain", and a "10" is "the worst possible pain that you can even imagine" (i.e. something like been eaten alive by a shark or being torn apart by  a lion).   You will frequently be asked to rate your pain. Please be as accurate, remember that medical decisions will be based on your responses. Please do not rate your pain above a 10. Doing so is actually interpreted as "symptom magnification" (exaggeration), as well as lack of understanding with regards to the scale. To put this into perspective, when you tell us that your pain is at a 10 (ten), what you are saying is that there is nothing we can do to make this pain any worse. (Carefully think about that.)

## 2020-09-26 ENCOUNTER — Telehealth: Payer: Self-pay

## 2020-09-26 NOTE — Telephone Encounter (Signed)
Post procedure phone call.  Patient states he is doing good.  

## 2020-09-28 ENCOUNTER — Encounter
Admission: RE | Admit: 2020-09-28 | Discharge: 2020-09-28 | Disposition: A | Discharge status: Home / Self Care | Payer: Worker's Compensation | Source: Ambulatory Visit | Attending: Neurosurgery | Admitting: Neurosurgery

## 2020-09-28 ENCOUNTER — Other Ambulatory Visit: Payer: Self-pay

## 2020-09-28 DIAGNOSIS — Z01818 Encounter for other preprocedural examination: Secondary | ICD-10-CM | POA: Insufficient documentation

## 2020-09-28 HISTORY — DX: Cardiac murmur, unspecified: R01.1

## 2020-09-28 HISTORY — DX: Unspecified osteoarthritis, unspecified site: M19.90

## 2020-09-28 HISTORY — DX: Chronic obstructive pulmonary disease, unspecified: J44.9

## 2020-09-28 HISTORY — DX: Gastro-esophageal reflux disease without esophagitis: K21.9

## 2020-09-28 HISTORY — DX: Prediabetes: R73.03

## 2020-09-28 HISTORY — DX: Anxiety disorder, unspecified: F41.9

## 2020-09-28 HISTORY — DX: Depression, unspecified: F32.A

## 2020-09-28 HISTORY — DX: Dyspnea, unspecified: R06.00

## 2020-09-28 LAB — URINALYSIS, ROUTINE W REFLEX MICROSCOPIC
Bilirubin Urine: NEGATIVE
Glucose, UA: NEGATIVE mg/dL
Hgb urine dipstick: NEGATIVE
Ketones, ur: 5 mg/dL — AB
Leukocytes,Ua: NEGATIVE
Nitrite: NEGATIVE
Protein, ur: NEGATIVE mg/dL
Specific Gravity, Urine: 1.023 (ref 1.005–1.030)
pH: 5 (ref 5.0–8.0)

## 2020-09-28 LAB — CBC
HCT: 41.1 % (ref 39.0–52.0)
Hemoglobin: 13.8 g/dL (ref 13.0–17.0)
MCH: 31.7 pg (ref 26.0–34.0)
MCHC: 33.6 g/dL (ref 30.0–36.0)
MCV: 94.5 fL (ref 80.0–100.0)
Platelets: 202 10*3/uL (ref 150–400)
RBC: 4.35 MIL/uL (ref 4.22–5.81)
RDW: 13.9 % (ref 11.5–15.5)
WBC: 9.1 10*3/uL (ref 4.0–10.5)
nRBC: 0 % (ref 0.0–0.2)

## 2020-09-28 LAB — BASIC METABOLIC PANEL
Anion gap: 10 (ref 5–15)
BUN: 12 mg/dL (ref 8–23)
CO2: 33 mmol/L — ABNORMAL HIGH (ref 22–32)
Calcium: 9 mg/dL (ref 8.9–10.3)
Chloride: 98 mmol/L (ref 98–111)
Creatinine, Ser: 0.92 mg/dL (ref 0.61–1.24)
GFR, Estimated: >60 mL/min (ref >60)
Glucose, Bld: 177 mg/dL — ABNORMAL HIGH (ref 70–99)
Potassium: 3.9 mmol/L (ref 3.5–5.1)
Sodium: 141 mmol/L (ref 135–145)

## 2020-09-28 LAB — SURGICAL PCR SCREEN
MRSA, PCR: NEGATIVE
Staphylococcus aureus: NEGATIVE

## 2020-09-28 LAB — TYPE AND SCREEN
ABO/RH(D): A POS
Antibody Screen: NEGATIVE

## 2020-09-28 MED FILL — Medication: INTRATHECAL | Qty: 1 | Status: AC

## 2020-09-28 NOTE — Patient Instructions (Addendum)
Your procedure is scheduled on: 10/08/20 Monday Report to the Registration Desk on the 1st floor of the [facility]. To find out your arrival time, please call [phone] between 1PM - 3PM on: 10/05/20 - Friday  REMEMBER: Instructions that are not followed completely may result in serious medical risk, up to and including death; or upon the discretion of your surgeon and anesthesiologist your surgery may need to be rescheduled.  Do not eat food after midnight the night before surgery.  No gum chewing, lozengers or hard candies.  You may however, drink CLEAR liquids up to 2 hours before you are scheduled to arrive for your surgery. Do not drink anything within 2 hours of your scheduled arrival time.  Clear liquids include: - water  - apple juice without pulp - gatorade (not RED, PURPLE, OR BLUE) - black coffee or tea (Do NOT add milk or creamers to the coffee or tea) Do NOT drink anything that is not on this list.  TAKE THESE MEDICATIONS THE MORNING OF SURGERY WITH A SIP OF WATER:  - albuterol (PROVENTIL) (2.5 MG/3ML) 0.083% nebulizer solution - amLODipine (NORVASC) 5 MG tablet - pantoprazole (PROTONIX) 40 MG tablet, take one the night before and one on the morning of surgery - helps to prevent nausea after surgery. - LIQUIFILM TEARS 1.4 % ophthalmic solution - propranolol (INDERAL) 10 MG tablet - Tiotropium Bromide-Olodaterol 2.5-2.5 MCG/ACT AERS   albuterol (VENTOLIN HFA) 108 (90 Base) MCG/ACT inhaler bring to the hospital.  Follow recommendations from Cardiologist, Pulmonologist or PCP regarding stopping Aspirin, Coumadin, Plavix, Eliquis, Pradaxa, or Pletal. Stop Eliquis beginning 10/05/20 , may resume , 10/12/20, 3 days after surgery.  One week prior to surgery: Stop Anti-inflammatories (NSAIDS) such as Advil, Aleve, Ibuprofen, Motrin, Naproxen, Naprosyn and Aspirin based products such as Excedrin, Goodys Powder, BC Powder.  Stop ANY OVER THE COUNTER supplements until  after surgery.  You may however, continue to take Tylenol if needed for pain up until the day of surgery.  No Alcohol for 24 hours before or after surgery.  No Smoking including e-cigarettes for 24 hours prior to surgery.  No chewable tobacco products for at least 6 hours prior to surgery.  No nicotine patches on the day of surgery.  Do not use any "recreational" drugs for at least a week prior to your surgery.  Please be advised that the combination of cocaine and anesthesia may have negative outcomes, up to and including death. If you test positive for cocaine, your surgery will be cancelled.  On the morning of surgery brush your teeth with toothpaste and water, you may rinse your mouth with mouthwash if you wish. Do not swallow any toothpaste or mouthwash.  Do not wear jewelry, make-up, hairpins, clips or nail polish.  Do not wear lotions, powders, or perfumes.   Do not shave body from the neck down 48 hours prior to surgery just in case you cut yourself which could leave a site for infection.  Also, freshly shaved skin may become irritated if using the CHG soap.  Contact lenses, hearing aids and dentures may not be worn into surgery.  Do not bring valuables to the hospital. [facility] is not responsible for any missing/lost belongings or valuables.   Use CHG Soap or wipes as directed on instruction sheet.  Bring your C-PAP to the hospital with you in case you may have to spend the night.   Notify your doctor if there is any change in your medical condition (cold, fever,  infection).  Wear comfortable clothing (specific to your surgery type) to the hospital.  After surgery, you can help prevent lung complications by doing breathing exercises.  Take deep breaths and cough every 1-2 hours. Your doctor may order a device called an Incentive Spirometer to help you take deep breaths. When coughing or sneezing, hold a pillow firmly against your incision with both hands. This is  called "splinting." Doing this helps protect your incision. It also decreases belly discomfort.  If you are being admitted to the hospital overnight, leave your suitcase in the car. After surgery it may be brought to your room.  If you are being discharged the day of surgery, you will not be allowed to drive home. You will need a responsible adult (18 years or older) to drive you home and stay with you that night.   If you are taking public transportation, you will need to have a responsible adult (18 years or older) with you. Please confirm with your physician that it is acceptable to use public transportation.   Please call the [facility] at [phone] if you have any questions about these instructions.  Surgery Visitation Policy:  Patients undergoing a surgery or procedure may have one family member or support person with them as long as that person is not COVID-19 positive or experiencing its symptoms.  That person may remain in the waiting area during the procedure.  Inpatient Visitation:    Visiting hours are 7 a.m. to 8 p.m. Inpatients will be allowed two visitors daily. The visitors may change each day during the patient's stay. No visitors under the age of 41. Any visitor under the age of 67 must be accompanied by an adult. The visitor must pass COVID-19 screenings, use hand sanitizer when entering and exiting the patient's room and wear a mask at all times, including in the patient's room. Patients must also wear a mask when staff or their visitor are in the room. Masking is required regardless of vaccination status.

## 2020-10-04 ENCOUNTER — Other Ambulatory Visit: Payer: Self-pay

## 2020-10-08 ENCOUNTER — Ambulatory Visit
Admission: RE | Admit: 2020-10-08 | Discharge: 2020-10-08 | Disposition: A | Discharge status: Home / Self Care | Payer: Worker's Compensation | Attending: Neurosurgery | Admitting: Neurosurgery

## 2020-10-08 ENCOUNTER — Encounter: Payer: Self-pay | Admitting: Neurosurgery

## 2020-10-08 ENCOUNTER — Encounter
Admission: RE | Disposition: A | Discharge status: Home / Self Care | Payer: Self-pay | Source: Home / Self Care | Attending: Neurosurgery

## 2020-10-08 ENCOUNTER — Ambulatory Visit: Payer: Worker's Compensation | Admitting: Anesthesiology

## 2020-10-08 ENCOUNTER — Ambulatory Visit: Payer: Worker's Compensation | Admitting: Urgent Care

## 2020-10-08 ENCOUNTER — Other Ambulatory Visit: Payer: Self-pay

## 2020-10-08 DIAGNOSIS — Z79899 Other long term (current) drug therapy: Secondary | ICD-10-CM | POA: Diagnosis not present

## 2020-10-08 DIAGNOSIS — Z96652 Presence of left artificial knee joint: Secondary | ICD-10-CM | POA: Insufficient documentation

## 2020-10-08 DIAGNOSIS — Z7901 Long term (current) use of anticoagulants: Secondary | ICD-10-CM | POA: Insufficient documentation

## 2020-10-08 DIAGNOSIS — Z96691 Finger-joint replacement of right hand: Secondary | ICD-10-CM | POA: Insufficient documentation

## 2020-10-08 DIAGNOSIS — Z86711 Personal history of pulmonary embolism: Secondary | ICD-10-CM | POA: Insufficient documentation

## 2020-10-08 DIAGNOSIS — Z888 Allergy status to other drugs, medicaments and biological substances status: Secondary | ICD-10-CM | POA: Insufficient documentation

## 2020-10-08 DIAGNOSIS — Z9981 Dependence on supplemental oxygen: Secondary | ICD-10-CM | POA: Insufficient documentation

## 2020-10-08 DIAGNOSIS — Z451 Encounter for adjustment and management of infusion pump: Secondary | ICD-10-CM | POA: Diagnosis not present

## 2020-10-08 DIAGNOSIS — Z87891 Personal history of nicotine dependence: Secondary | ICD-10-CM | POA: Insufficient documentation

## 2020-10-08 DIAGNOSIS — G894 Chronic pain syndrome: Secondary | ICD-10-CM | POA: Insufficient documentation

## 2020-10-08 HISTORY — PX: INTRATHECAL PUMP REVISION: SHX6810

## 2020-10-08 LAB — ABO/RH: ABO/RH(D): A POS

## 2020-10-08 SURGERY — INTRATHECAL PUMP REVISION
Anesthesia: General | Laterality: Right

## 2020-10-08 MED ORDER — ONDANSETRON HCL 4 MG/2ML IJ SOLN
INTRAMUSCULAR | Status: AC
  Filled 2020-10-08: qty 2

## 2020-10-08 MED ORDER — VANCOMYCIN HCL 1000 MG IV SOLR
INTRAVENOUS | Status: DC | PRN
  Administered 2020-10-08: 1000 mg via TOPICAL

## 2020-10-08 MED ORDER — DEXAMETHASONE SODIUM PHOSPHATE 10 MG/ML IJ SOLN
INTRAMUSCULAR | Status: AC
  Filled 2020-10-08: qty 1

## 2020-10-08 MED ORDER — TRAMADOL HCL 50 MG PO TABS: 50.0000 mg | ORAL_TABLET | Freq: Three times a day (TID) | ORAL | 0 refills | Status: AC | PRN

## 2020-10-08 MED ORDER — 0.9 % SODIUM CHLORIDE (POUR BTL) OPTIME
TOPICAL | Status: DC | PRN
  Administered 2020-10-08: 1000 mL

## 2020-10-08 MED ORDER — CHLORHEXIDINE GLUCONATE 0.12 % MT SOLN
OROMUCOSAL | Status: AC
  Administered 2020-10-08: 15 mL via OROMUCOSAL
  Filled 2020-10-08: qty 15

## 2020-10-08 MED ORDER — VANCOMYCIN HCL 1000 MG IV SOLR
INTRAVENOUS | Status: AC
  Filled 2020-10-08: qty 20

## 2020-10-08 MED ORDER — SUCCINYLCHOLINE CHLORIDE 200 MG/10ML IV SOSY
PREFILLED_SYRINGE | INTRAVENOUS | Status: DC | PRN
  Administered 2020-10-08: 120 mg via INTRAVENOUS

## 2020-10-08 MED ORDER — PROPOFOL 1000 MG/100ML IV EMUL
INTRAVENOUS | Status: AC
  Filled 2020-10-08: qty 100

## 2020-10-08 MED ORDER — CEFAZOLIN SODIUM-DEXTROSE 2-4 GM/100ML-% IV SOLN
2.0000 g | INTRAVENOUS | Status: AC
  Administered 2020-10-08: 2 g via INTRAVENOUS

## 2020-10-08 MED ORDER — MIDAZOLAM HCL 2 MG/2ML IJ SOLN
INTRAMUSCULAR | Status: AC
  Filled 2020-10-08: qty 2

## 2020-10-08 MED ORDER — SUGAMMADEX SODIUM 500 MG/5ML IV SOLN
INTRAVENOUS | Status: AC
  Filled 2020-10-08: qty 5

## 2020-10-08 MED ORDER — ROCURONIUM BROMIDE 100 MG/10ML IV SOLN
INTRAVENOUS | Status: DC | PRN
  Administered 2020-10-08: 5 mg via INTRAVENOUS
  Administered 2020-10-08: 35 mg via INTRAVENOUS

## 2020-10-08 MED ORDER — OXYCODONE HCL 5 MG PO TABS
5.0000 mg | ORAL_TABLET | Freq: Once | ORAL | Status: DC | PRN
Start: 2020-10-08 — End: 2020-10-08

## 2020-10-08 MED ORDER — SUCCINYLCHOLINE CHLORIDE 200 MG/10ML IV SOSY
PREFILLED_SYRINGE | INTRAVENOUS | Status: AC
  Filled 2020-10-08: qty 10

## 2020-10-08 MED ORDER — SUGAMMADEX SODIUM 500 MG/5ML IV SOLN
INTRAVENOUS | Status: DC | PRN
  Administered 2020-10-08: 250 mg via INTRAVENOUS

## 2020-10-08 MED ORDER — LIDOCAINE HCL (PF) 2 % IJ SOLN
INTRAMUSCULAR | Status: AC
  Filled 2020-10-08: qty 5

## 2020-10-08 MED ORDER — OXYCODONE HCL 5 MG/5ML PO SOLN
5.0000 mg | Freq: Once | ORAL | Status: DC | PRN
Start: 2020-10-08 — End: 2020-10-08

## 2020-10-08 MED ORDER — MIDAZOLAM HCL 2 MG/2ML IJ SOLN
INTRAMUSCULAR | Status: DC | PRN
  Administered 2020-10-08: 2 mg via INTRAVENOUS

## 2020-10-08 MED ORDER — DEXAMETHASONE SODIUM PHOSPHATE 10 MG/ML IJ SOLN
INTRAMUSCULAR | Status: DC | PRN
Start: 2020-10-08 — End: 2020-10-08
  Administered 2020-10-08: 10 mg via INTRAVENOUS

## 2020-10-08 MED ORDER — LACTATED RINGERS IV SOLN
INTRAVENOUS | Status: DC
Start: 2020-10-08 — End: 2020-10-08

## 2020-10-08 MED ORDER — BUPIVACAINE HCL (PF) 0.5 % IJ SOLN
INTRAMUSCULAR | Status: AC
  Filled 2020-10-08: qty 30

## 2020-10-08 MED ORDER — PROPOFOL 10 MG/ML IV BOLUS
INTRAVENOUS | Status: DC | PRN
  Administered 2020-10-08: 100 mg via INTRAVENOUS
  Administered 2020-10-08: 150 mg via INTRAVENOUS

## 2020-10-08 MED ORDER — FENTANYL CITRATE (PF) 100 MCG/2ML IJ SOLN: 25.0000 ug | INTRAMUSCULAR | Status: DC | PRN

## 2020-10-08 MED ORDER — LIDOCAINE HCL (CARDIAC) PF 100 MG/5ML IV SOSY
PREFILLED_SYRINGE | INTRAVENOUS | Status: DC | PRN
  Administered 2020-10-08: 100 mg via INTRAVENOUS

## 2020-10-08 MED ORDER — PHENYLEPHRINE HCL (PRESSORS) 10 MG/ML IV SOLN
INTRAVENOUS | Status: AC
  Filled 2020-10-08: qty 1

## 2020-10-08 MED ORDER — BUPIVACAINE-EPINEPHRINE (PF) 0.5% -1:200000 IJ SOLN
INTRAMUSCULAR | Status: DC | PRN
  Administered 2020-10-08: 20 mL

## 2020-10-08 MED ORDER — FENTANYL CITRATE (PF) 100 MCG/2ML IJ SOLN
INTRAMUSCULAR | Status: DC | PRN
  Administered 2020-10-08 (×2): 50 ug via INTRAVENOUS

## 2020-10-08 MED ORDER — FENTANYL CITRATE (PF) 100 MCG/2ML IJ SOLN
INTRAMUSCULAR | Status: AC
  Filled 2020-10-08: qty 2

## 2020-10-08 MED ORDER — CEFAZOLIN SODIUM-DEXTROSE 2-4 GM/100ML-% IV SOLN
INTRAVENOUS | Status: AC
  Filled 2020-10-08: qty 100

## 2020-10-08 MED ORDER — CHLORHEXIDINE GLUCONATE 0.12 % MT SOLN: 15.0000 mL | Freq: Once | OROMUCOSAL | Status: AC

## 2020-10-08 MED ORDER — ROCURONIUM BROMIDE 10 MG/ML (PF) SYRINGE
PREFILLED_SYRINGE | INTRAVENOUS | Status: AC
  Filled 2020-10-08: qty 10

## 2020-10-08 MED ORDER — ONDANSETRON HCL 4 MG/2ML IJ SOLN
INTRAMUSCULAR | Status: DC | PRN
  Administered 2020-10-08: 4 mg via INTRAVENOUS

## 2020-10-08 MED ORDER — ORAL CARE MOUTH RINSE: 15.0000 mL | Freq: Once | OROMUCOSAL | Status: AC

## 2020-10-08 SURGICAL SUPPLY — 62 items
ADH SKN CLS APL DERMABOND .7 (GAUZE/BANDAGES/DRESSINGS) ×1
AGENT HMST MTR 8 SURGIFLO (HEMOSTASIS)
APL PRP STRL LF DISP 70% ISPRP (MISCELLANEOUS) ×2
BINDER ABDOMINAL 12 ML 46-62 (SOFTGOODS) IMPLANT
BLADE SURG 15 STRL LF DISP TIS (BLADE) ×1 IMPLANT
BLADE SURG 15 STRL SS (BLADE) ×2
CHLORAPREP W/TINT 26 (MISCELLANEOUS) ×4 IMPLANT
CNTNR SPEC 2.5X3XGRAD LEK (MISCELLANEOUS) ×1
CONT SPEC 4OZ STER OR WHT (MISCELLANEOUS) ×1
CONT SPEC 4OZ STRL OR WHT (MISCELLANEOUS) ×1
CONTAINER SPEC 2.5X3XGRAD LEK (MISCELLANEOUS) ×1 IMPLANT
COUNTER NEEDLE 20/40 LG (NEEDLE) ×2 IMPLANT
COVER LIGHT HANDLE STERIS (MISCELLANEOUS) ×4 IMPLANT
DERMABOND ADVANCED (GAUZE/BANDAGES/DRESSINGS) ×1
DERMABOND ADVANCED .7 DNX12 (GAUZE/BANDAGES/DRESSINGS) ×1 IMPLANT
DRAPE C-ARM 42X72 X-RAY (DRAPES) IMPLANT
DRAPE INCISE IOBAN 66X45 STRL (DRAPES) ×2 IMPLANT
DRAPE LAPAROTOMY 100X77 ABD (DRAPES) ×2 IMPLANT
DRAPE SURG 17X11 SM STRL (DRAPES) ×4 IMPLANT
DRSG TEGADERM 4X4.75 (GAUZE/BANDAGES/DRESSINGS) IMPLANT
DRSG TEGADERM 6X8 (GAUZE/BANDAGES/DRESSINGS) ×2 IMPLANT
DRSG TELFA 4X3 1S NADH ST (GAUZE/BANDAGES/DRESSINGS) IMPLANT
ELECT REM PT RETURN 9FT ADLT (ELECTROSURGICAL) ×2
ELECTRODE REM PT RTRN 9FT ADLT (ELECTROSURGICAL) ×1 IMPLANT
GAUZE 4X4 16PLY ~~LOC~~+RFID DBL (SPONGE) ×2 IMPLANT
GAUZE SPONGE 4X4 12PLY STRL (GAUZE/BANDAGES/DRESSINGS) ×2 IMPLANT
GAUZE XEROFORM 1X8 LF (GAUZE/BANDAGES/DRESSINGS) ×2 IMPLANT
GLOVE SRG 8 PF TXTR STRL LF DI (GLOVE) ×2 IMPLANT
GLOVE SURG SYN 6.5 ES PF (GLOVE) ×4 IMPLANT
GLOVE SURG SYN 8.0 (GLOVE) ×4 IMPLANT
GLOVE SURG UNDER POLY LF SZ6.5 (GLOVE) ×4 IMPLANT
GLOVE SURG UNDER POLY LF SZ8 (GLOVE) ×4
GOWN SRG LRG LVL 4 IMPRV REINF (GOWNS) ×2 IMPLANT
GOWN STRL REIN LRG LVL4 (GOWNS) ×4
GOWN STRL REUS W/ TWL XL LVL3 (GOWN DISPOSABLE) ×1 IMPLANT
GOWN STRL REUS W/TWL XL LVL3 (GOWN DISPOSABLE) ×2
GRADUATE 1200CC STRL 31836 (MISCELLANEOUS) ×2 IMPLANT
KIT REFILL (MISCELLANEOUS) ×1
KIT REFILL CATH SYNCHROMED II (MISCELLANEOUS) ×1 IMPLANT
KIT TURNOVER KIT A (KITS) ×2 IMPLANT
MANIFOLD NEPTUNE II (INSTRUMENTS) ×2 IMPLANT
MARKER SKIN DUAL TIP RULER LAB (MISCELLANEOUS) ×4 IMPLANT
NEEDLE HYPO 22GX1.5 SAFETY (NEEDLE) ×2 IMPLANT
NS IRRIG 1000ML POUR BTL (IV SOLUTION) ×2 IMPLANT
PACK LAMINECTOMY NEURO (CUSTOM PROCEDURE TRAY) IMPLANT
PACK UNIVERSAL (MISCELLANEOUS) ×2 IMPLANT
PAD ARMBOARD 7.5X6 YLW CONV (MISCELLANEOUS) ×2 IMPLANT
PENCIL ELECTRO HAND CTR (MISCELLANEOUS) ×2 IMPLANT
POUCH TYRX NEURO LRG (Mesh General) ×2 IMPLANT
PUMP SYNCHROMED II 40ML RESVR (Neuro Prosthesis/Implant) ×2 IMPLANT
SPOGE SURGIFLO 8M (HEMOSTASIS)
SPONGE SURGIFLO 8M (HEMOSTASIS) IMPLANT
SUT ETHILON 3-0 FS-10 30 BLK (SUTURE) ×4
SUT POLYSORB 2-0 5X18 GS-10 (SUTURE) ×6 IMPLANT
SUT SILK 2 0 PERMA HAND 18 BK (SUTURE) ×6 IMPLANT
SUT VIC AB 0 CT1 18XCR BRD 8 (SUTURE) ×1 IMPLANT
SUT VIC AB 0 CT1 8-18 (SUTURE) ×2
SUTURE EHLN 3-0 FS-10 30 BLK (SUTURE) ×2 IMPLANT
SYR 20ML LL LF (SYRINGE) ×4 IMPLANT
TAPE TRANSPORE STRL 2 31045 (GAUZE/BANDAGES/DRESSINGS) IMPLANT
TOWEL OR 17X26 4PK STRL BLUE (TOWEL DISPOSABLE) ×6 IMPLANT
TUBING CONNECTING 10 (TUBING) ×2 IMPLANT

## 2020-10-08 NOTE — Anesthesia Postprocedure Evaluation (Signed)
Anesthesia Post Note  Patient: Greycen Geddes  Procedure(s) Performed: REVISION INTRATHECAL PUMP RIGHT ABDOMEN (Right)  Patient location during evaluation: PACU Anesthesia Type: General Level of consciousness: awake and alert Pain management: pain level controlled Vital Signs Assessment: post-procedure vital signs reviewed and stable Respiratory status: spontaneous breathing, nonlabored ventilation, respiratory function stable and patient connected to nasal cannula oxygen Cardiovascular status: blood pressure returned to baseline and stable Postop Assessment: no apparent nausea or vomiting Anesthetic complications: no   No notable events documented.   Last Vitals:  Vitals:   10/08/20 0912 10/08/20 0923  BP: (!) 142/73 (!) 158/83  Pulse: 71 73  Resp: 19 20  Temp: 36.6 C (!) 36.1 C  SpO2: 96% 95%    Last Pain:  Vitals:   10/08/20 0923  TempSrc: Temporal  PainSc: 0-No pain                 Precious Haws Azavier Creson

## 2020-10-08 NOTE — Anesthesia Preprocedure Evaluation (Signed)
Anesthesia Evaluation  Patient identified by MRN, date of birth, ID band Patient awake    Reviewed: Allergy & Precautions, NPO status , Patient's Chart, lab work & pertinent test results  History of Anesthesia Complications Negative for: history of anesthetic complications  Airway Mallampati: III  TM Distance: <3 FB Neck ROM: full    Dental  (+) Chipped   Pulmonary shortness of breath, sleep apnea , pneumonia, COPD, former smoker,    Pulmonary exam normal        Cardiovascular hypertension, (-) angina(-) Past MI and (-) DOE Normal cardiovascular exam     Neuro/Psych  Headaches, PSYCHIATRIC DISORDERS  Neuromuscular disease    GI/Hepatic GERD  Medicated and Controlled,(+) Hepatitis -  Endo/Other  negative endocrine ROS  Renal/GU      Musculoskeletal  (+) Arthritis ,   Abdominal   Peds  Hematology negative hematology ROS (+)   Anesthesia Other Findings Past Medical History: No date: Allergy No date: Anxiety No date: Apnea, sleep     Comment:  uses bi pap machine No date: Arthritis No date: Cancer ([place]) 01/22/2007: Chest pain No date: COPD (chronic obstructive pulmonary disease) (HCC) No date: Depression No date: Dyspnea No date: GERD (gastroesophageal reflux disease) No date: Heart murmur 07/24/2003: Hepatitis C virus     Comment:  tx started on 11/23/2003 No date: Hypertension 09/02/2002: Pneumonia No date: Pre-diabetes 12/07/2006: Pulmonary embolism ([place]) 09/06/2017: Pulmonary embolus ([place]) 07/02/2007: Severe frontal headaches     Comment:  patient states " came from the baclofen"  Past Surgical History: 12/12/2003: arthroscopic knee surgery; Left 09/07/2007: bone spurs; Left 2004: bone spurs removal; Bilateral     Comment:  left and right shoulder bone spurs  removed 01/29/2008: chest pain  11/20/2007: COLONOSCOPY 02/20/2010: EPIDURAL BLOCK INJECTION     Comment:  L3-L4 06/11/2010: Implant 2,  medtronic   bladder stimulators 12/28/2001: Insertion of Medtronic  Spinal Cord Stimulator No date: JOINT REPLACEMENT 2018: JOINT REPLACEMENT; Right     Comment:  index finger joint 10/07/2000: LAMINECTOMY     Comment:  L4-5-S1 06/18/2004: left total knee replacement; Left 12/23/2013: LOBECTOMY; Right     Comment:  2 lobes removed; stayed in hospital for approx 2 months 01/07/2001: LUMBAR FUSION     Comment:  Internal and post lumbar fusion  L4-5-S-1 08/27/2006: medtronic pain infusion pump implanted  12/02/2013: medtronic pain pump stimulator      Comment:  third pump inserted 09/19/2008: medtronic pump  stopped working     Comment:   pump was replaced No date: REmoval of Spinal cord stimulator     Comment:  adjustment of wires done 05/19/2002/ removal  done on               08/25/2002 04/18/2003: Rotary cuff surgery; Right 07/23/2007: UPPER GI ENDOSCOPY  BMI    Body Mass Index: 41.60 kg/m      Reproductive/Obstetrics negative OB ROS                             Anesthesia Physical Anesthesia Plan  ASA: 3  Anesthesia Plan: General LMA   Post-op Pain Management:    Induction: Intravenous  PONV Risk Score and Plan: Dexamethasone, Ondansetron, Midazolam and Treatment may vary due to age or medical condition  Airway Management Planned: LMA  Additional Equipment:   Intra-op Plan:   Post-operative Plan: Extubation in OR  Informed Consent: I have reviewed the patients History and Physical, chart, labs and  discussed the procedure including the risks, benefits and alternatives for the proposed anesthesia with the patient or authorized representative who has indicated his/her understanding and acceptance.     Dental Advisory Given  Plan Discussed with: Anesthesiologist, CRNA and Surgeon  Anesthesia Plan Comments: (Patient consented for risks of anesthesia including but not limited to:  - adverse reactions to medications - damage to eyes,  teeth, lips or other oral mucosa - nerve damage due to positioning  - sore throat or hoarseness - Damage to heart, brain, nerves, lungs, other parts of body or loss of life  Patient voiced understanding.)        Anesthesia Quick Evaluation

## 2020-10-08 NOTE — Anesthesia Procedure Notes (Signed)
Procedure Name: Intubation Date/Time: 10/08/2020 7:41 AM Performed by: Aline Brochure, CRNA Pre-anesthesia Checklist: Patient identified, Patient being monitored, Timeout performed, Emergency Drugs available and Suction available Patient Re-evaluated:Patient Re-evaluated prior to induction Oxygen Delivery Method: Circle system utilized Preoxygenation: Pre-oxygenation with 100% oxygen Induction Type: IV induction Ventilation: Mask ventilation without difficulty Laryngoscope Size: McGraph and 4 Grade View: Grade I Tube type: Oral Tube size: 7.0 mm Number of attempts: 1 Airway Equipment and Method: Stylet Placement Confirmation: ETT inserted through vocal cords under direct vision, positive ETCO2 and breath sounds checked- equal and bilateral Secured at: 22 cm Tube secured with: Tape Dental Injury: Teeth and Oropharynx as per pre-operative assessment

## 2020-10-08 NOTE — Transfer of Care (Signed)
Immediate Anesthesia Transfer of Care Note  Patient: Wilfred Clemmer  Procedure(s) Performed: REVISION INTRATHECAL PUMP RIGHT ABDOMEN (Right)  Patient Location: PACU  Anesthesia Type:General  Level of Consciousness: awake, alert  and oriented  Airway & Oxygen Therapy: Patient Spontanous Breathing and Patient connected to face mask oxygen  Post-op Assessment: Report given to RN and Post -op Vital signs reviewed and stable  Post vital signs: Reviewed and stable  Last Vitals:  Vitals Value Taken Time  BP 169/95 10/08/20 0845  Temp    Pulse 76 10/08/20 0846  Resp 22 10/08/20 0845  SpO2 97 % 10/08/20 0846  Vitals shown include unvalidated device data.  Last Pain:  Vitals:   10/08/20 0607  TempSrc: Temporal  PainSc: 5          Complications: No notable events documented.

## 2020-10-08 NOTE — Interval H&P Note (Signed)
History and Physical Interval Note:  10/08/2020 6:44 AM  Anice Paganini  has presented today for surgery, with the diagnosis of chronic pain syndrome g89.4.  The various methods of treatment have been discussed with the patient and family. After consideration of risks, benefits and other options for treatment, the patient has consented to  Procedure(s) with comments: [place] (Right) - Local w/ MAC as a surgical intervention.  The patient's history has been reviewed, patient examined, no change in status, stable for surgery.  I have reviewed the patient's chart and labs.  Questions were answered to the patient's satisfaction.     Deetta Perla

## 2020-10-08 NOTE — Op Note (Signed)
Operative Note   SURGERY DATE:  10/08/2020   PRE-OP DIAGNOSIS: Chronic Pain    POST-OP DIAGNOSIS:  Chronic Pain    Procedure(s) with comments: Replacement Right Abdomen Hydromorphone pump   SURGEON:     * Malen Gauze, MD      Cooper Render PA Assistant   ANESTHESIA: General    OPERATIVE FINDINGS: Pump reaching end of life  Indications: Brian Spencer was seen in clinic on 6/21 and found to have good pain control with intrathecal pump. It was reaching end of life and replacement discussed to prevent withdrawal. The risks including hematoma, infection, damage to catheter, failure of relief of pain, and need for revision surgery were discussed. The patient elected to proceed with the pump exchange   Procedure:  The patient was brought to the operating room and placed in supine position on a standard OR table. The anesthesia service had vascular access and intubated the patient.  The abdomen incision was prepped and draped in a sterile fashion and the previous incision marked.  A hard timeout was performed.  Local anesthetic was instilled into the incision.    Incision was opened sharply to the subcutaneous layer.  The monopolar was then used to dissect to the pump and the width of this was exposed taking care not to damage the catheter.  The pump was removed and a needle inserted into port and CSF aspirated. The catheter was removed from the port and then the medication removed from the pump. This was transferred sterilely to the new battery. The catheter was inspected and found to be intact. The pocket site was irrigated and expanded. Vancomycin powder was placed in pocket.   The catheter was then connected to the new pump and again, fluid aspirated from the port.  The pump had ben filled with Hydromorphone 2.'5mg'$ /ml  flow at 1.2285 mg/day Clonidine 15mg/ml at 24.569 mcg/day Bupivicaine '10mg'$ /ml at 4.914 mg/day  The pump was replaced into the pocket and secured with a 2-0 silk suture.   An antibiotic laden pouch was inserted. The deep tissue was closed with 0 Vicryl and subcutaneous with 2-0 Vicryl.  The skin was closed with 3-0 Nylon.  A dressing was applied.  Patient was turned to the supine position and sedation medications were stopped and the patient was awoken from anesthesia.  Patient was taken to the PACU for further recovery and seen to be at neurologic baseline.  I discussed the results with the family        ESTIMATED BLOOD LOSS:   20 cc     IMPLANT POUCH TYRX NEURO LRG - [id number] Inventory Item: PRegis BillNEURO LRG Serial no.:  Model/Cat no.: [id number] Implant name: PStarr SinclairLRG - [id number]: Right Area: Abdomen  Manufacturer: TYRX Date of Manufacture:    Action: Implanted Number Used: 1   Device Identifier:  Device Identifier Type:     PUMP SYNCHROMED II 40ML RESVR - [id number]  Inventory Item: PUMP SYNCHROMED II 40ML RESVR Serial no.: [id number] Model/Cat no.: [id number] Implant name: PUMP SYNCHROMED II 40ML RESVR - [id number] Laterality: Right Area: Abdomen  Manufacturer: MEDTRONIC NEUROMOD PAIN MGMT Date of Manufacture:    Action: Implanted Number Used: 1   Device Identifier:  Device Identifier Type:         I performed the case in its entirety with assistance of PA, Brian Spencer

## 2020-10-08 NOTE — H&P (Signed)
Brian Spencer is an 70 y.o. male.   Chief Complaint: Chronic pain HPI: Brian Spencer is here for evaluation of ongoing chronic pain which is being treated with intrathecal pain pump. He had this placed greater than 15 years ago and has had multiple pump replacements including last in 2015. He states it is working well for his pain. He does have chronic numbness in the right leg but denies other symptoms. He did have the pump recently refilled. He would like to have it replaced before it stops working as he has had this happen before.  He does have multiple comorbidities and does use oxygen occasionally. He is also on Eliquis but has been cleared to stop this for the surgery. He is here for pump replacement.  Past Medical History:  Diagnosis Date   Allergy    Anxiety    Apnea, sleep    uses bi pap machine   Arthritis    Cancer (Brian Spencer)    Chest pain 01/22/2007   COPD (chronic obstructive pulmonary disease) (HCC)    Depression    Dyspnea    GERD (gastroesophageal reflux disease)    Heart murmur    Hepatitis C virus 07/24/2003   tx started on 11/23/2003   Hypertension    Pneumonia 09/02/2002   Pre-diabetes    Pulmonary embolism (Brian Spencer) 12/07/2006   Pulmonary embolus (Brian Spencer) 09/06/2017   Severe frontal headaches 07/02/2007   patient states " came from the baclofen"    Past Surgical History:  Procedure Laterality Date   arthroscopic knee surgery Left 12/12/2003   bone spurs Left 09/07/2007   bone spurs removal Bilateral 2004   left and right shoulder bone spurs  removed   chest pain   01/29/2008   COLONOSCOPY  11/20/2007   EPIDURAL BLOCK INJECTION  02/20/2010   L3-L4   Implant 2, medtronic   bladder stimulators  06/11/2010   Insertion of Medtronic  Spinal Cord Stimulator  12/28/2001   JOINT REPLACEMENT     JOINT REPLACEMENT Right 2018   index finger joint   LAMINECTOMY  10/07/2000   L4-5-S1   left total knee replacement Left 06/18/2004   LOBECTOMY Right 12/23/2013   2 lobes removed;  stayed in hospital for approx 2 months   LUMBAR FUSION  01/07/2001   Internal and post lumbar fusion  L4-5-S-1   medtronic pain infusion pump implanted   08/27/2006   medtronic pain pump stimulator   12/02/2013   third pump inserted   medtronic pump  stopped working  09/19/2008    pump was replaced   REmoval of Spinal cord stimulator     adjustment of wires done 05/19/2002/ removal  done on 08/25/2002   Rotary cuff surgery Right 04/18/2003   UPPER GI ENDOSCOPY  07/23/2007    Family History  Problem Relation Age of Onset   COPD Mother    Social History:  reports that he quit smoking about 6 years ago. His smoking use included cigarettes. He has a 30.00 pack-year smoking history. He has never used smokeless tobacco. He reports current alcohol use of about 2.0 standard drinks per week. He reports that he does not use drugs.  Allergies:  Allergies  Allergen Reactions   Baclofen Other (See Comments)    Severe headaches Severe headaches   Hydralazine Shortness Of Breath    Medications Prior to Admission  Medication Sig Dispense Refill   acetaminophen (TYLENOL) 500 MG tablet Take 500 mg by mouth 3 (three) times daily as needed for moderate  pain.     albuterol (PROVENTIL) (2.5 MG/3ML) 0.083% nebulizer solution Inhale 2.5 mg into the lungs 2 (two) times daily.     albuterol (VENTOLIN HFA) 108 (90 Base) MCG/ACT inhaler Inhale 2 puffs into the lungs every 6 (six) hours as needed for wheezing or shortness of breath.     amLODipine (NORVASC) 5 MG tablet Take 5 mg by mouth daily.     apixaban (ELIQUIS) 5 MG TABS tablet Take 5 mg by mouth 2 (two) times daily.     atorvastatin (LIPITOR) 10 MG tablet Take 10 mg by mouth at bedtime.     clonazePAM (KLONOPIN) 0.5 MG tablet Take 0.5 mg by mouth daily as needed for anxiety.     Cobalamin Combinations (NEURIVA PLUS) CAPS Take 1 capsule by mouth daily.     Coenzyme Q10 (COQ10) 100 MG CAPS Take 100 mg by mouth daily.     furosemide (LASIX) 40 MG  tablet Take 40 mg by mouth 2 (two) times daily.     Magnesium Oxide 420 MG TABS Take 420 mg by mouth daily.     Multiple Vitamin (MULTIVITAMIN) tablet Take 1 tablet by mouth daily. Centrum silver     NON FORMULARY IT pump Hydromorphone '10mg'$ /ml Bupivacaine 20.0 mg/ml Clonidine 150 mcg/ml Total 24 hour dose 1.2276 mg/day     OXYGEN Inhale 3 L into the lungs daily as needed (Exertion).     pantoprazole (PROTONIX) 40 MG tablet Take 40 mg by mouth daily.     polyvinyl alcohol (LIQUIFILM TEARS) 1.4 % ophthalmic solution Place 1 drop into both eyes 2 (two) times daily.     propranolol (INDERAL) 10 MG tablet Take 10 mg by mouth 3 (three) times daily.     tamsulosin (FLOMAX) 0.4 MG CAPS capsule Take 0.4 mg by mouth at bedtime.     Tiotropium Bromide-Olodaterol 2.5-2.5 MCG/ACT AERS Inhale 2 puffs into the lungs daily. Spiriva     traZODone (DESYREL) 100 MG tablet Take 200 mg by mouth at bedtime.      venlafaxine XR (EFFEXOR-XR) 75 MG 24 hr capsule Take 225 mg by mouth daily.      No results found for this or any previous visit (from the past 48 hour(s)). No results found.  Review of Systems General ROS: Negative Psychological ROS: Negative Ophthalmic ROS: Negative ENT ROS: Negative Hematological and Lymphatic ROS: Negative  Endocrine ROS: Negative Respiratory ROS: Negative Cardiovascular ROS: Negative Gastrointestinal ROS: Negative Genito-Urinary ROS: Negative Musculoskeletal ROS: Positive for back pain Neurological ROS: Positive for leg pain, right leg numbness Dermatological ROS: Negative Blood pressure (!) 182/77, pulse 72, temperature (!) 96.9 F (36.1 C), temperature source Temporal, resp. rate 18, height '5\' 5"'$  (1.651 m), weight 113.4 kg, SpO2 95 %. Physical Exam  General appearance: Alert, cooperative, in no acute distress Head: Normocephalic, atraumatic Eyes: Normal, EOM intact Oropharynx: Wearing facemask, nasal cannula on CV: Regular rate and rhythm Pulm: Clear to  auscultation Abdomen: Well-healed right lower quadrant incision  Neurologic exam:  Mental status: alertness: alert, affect: normal Speech: fluent and clear Motor:strength symmetric 5/5 in bilateral lower extremities throughout all motor groups Sensory: Some decreased light touch in the right leg Gait: Using walker Assessment/Plan Proceed with pump replacement  Deetta Perla, MD 10/08/2020, 6:43 AM

## 2020-10-08 NOTE — Discharge Instructions (Addendum)
NEUROSURGERY DISCHARGE INSTRUCTIONS  Admission diagnosis: chronic pain syndrome g89.4  Operative procedure: Revision of intrathecal pain pump   What to do after you leave the hospital:  Recommended diet: regular diet. Increase protein intake to promote wound healing.  Recommended activity: activity as tolerated. No driving for 2 weeks.You should walk multiple times per day  Special Instructions  No straining, no heavy lifting > 10lbs x 4 weeks.  Keep incision area clean and dry. May shower in 2 days. No baths or pools for 6 weeks.  Please remove dressing on post-op day 2, no need to apply a bandage afterwards  You have sutures or staples that will be removed in clinic.  Please take home pain medications as directed. It is ok to take Tylenol and Ibuprofen as needed for incisional pain. Take a stool softener if on pain medications.  Please restart your Eliquis on post-op day 3 (10/11/20)   Please Report any of the following: Nausea or Vomiting, Temperature is greater than 101.41F (38.1C) degrees, Dizziness, Abdominal Pain, Difficulty Breathing or Shortness of Breath, Inability to Eat, drink Fluids, or Take medications, Bleeding, swelling, or drainage from surgical incision sites, New numbness or weakness, and Bowel or bladder dysfunction to the neurosurgeon on call at [phone]  Additional Follow up appointments Please follow up with Cooper Render PA-C in [place] clinic as scheduled for suture removal   Please see below for scheduled appointments:  Future Appointments  Date Time Provider [place]  12/04/2020  1:20 PM Milinda Pointer, MD [facility] None   AMBULATORY SURGERY  DISCHARGE INSTRUCTIONS   The drugs that you were given will stay in your system until tomorrow so for the next 24 hours you should not:  Drive an automobile Make any legal decisions Drink any alcoholic beverage   You may resume regular meals tomorrow.  Today it is better to start with  liquids and gradually work up to solid foods.  You may eat anything you prefer, but it is better to start with liquids, then soup and crackers, and gradually work up to solid foods.   Please notify your doctor immediately if you have any unusual bleeding, trouble breathing, redness and pain at the surgery site, drainage, fever, or pain not relieved by medication.    Additional Instructions: Per Dr. Lacinda Axon please restart your Eliquis on post-op day 3 (10/11/20)     Please contact your physician with any problems or Same Day Surgery at [phone], Monday through Friday 6 am to 4 pm, or [facility] number at [phone].

## 2020-11-08 ENCOUNTER — Other Ambulatory Visit: Payer: Self-pay

## 2020-11-08 MED ORDER — PAIN MANAGEMENT IT PUMP REFILL: 1.0000 | Freq: Once | INTRATHECAL | 0 refills | Status: AC

## 2020-12-04 ENCOUNTER — Ambulatory Visit: Payer: Worker's Compensation | Attending: Pain Medicine | Admitting: Pain Medicine

## 2020-12-04 ENCOUNTER — Other Ambulatory Visit: Payer: Self-pay

## 2020-12-04 ENCOUNTER — Encounter: Payer: Self-pay | Admitting: Pain Medicine

## 2020-12-04 VITALS — BP 140/68 | HR 69 | Temp 97.1°F | Resp 16 | Ht 65.0 in | Wt 245.0 lb

## 2020-12-04 DIAGNOSIS — M5441 Lumbago with sciatica, right side: Secondary | ICD-10-CM | POA: Insufficient documentation

## 2020-12-04 DIAGNOSIS — M961 Postlaminectomy syndrome, not elsewhere classified: Secondary | ICD-10-CM

## 2020-12-04 DIAGNOSIS — G894 Chronic pain syndrome: Secondary | ICD-10-CM | POA: Diagnosis present

## 2020-12-04 DIAGNOSIS — G8929 Other chronic pain: Secondary | ICD-10-CM

## 2020-12-04 DIAGNOSIS — M79604 Pain in right leg: Secondary | ICD-10-CM | POA: Insufficient documentation

## 2020-12-04 DIAGNOSIS — Z5189 Encounter for other specified aftercare: Secondary | ICD-10-CM | POA: Diagnosis present

## 2020-12-04 DIAGNOSIS — M51379 Other intervertebral disc degeneration, lumbosacral region without mention of lumbar back pain or lower extremity pain: Secondary | ICD-10-CM

## 2020-12-04 DIAGNOSIS — Z451 Encounter for adjustment and management of infusion pump: Secondary | ICD-10-CM

## 2020-12-04 DIAGNOSIS — Z978 Presence of other specified devices: Secondary | ICD-10-CM

## 2020-12-04 DIAGNOSIS — M4326 Fusion of spine, lumbar region: Secondary | ICD-10-CM

## 2020-12-04 DIAGNOSIS — M5137 Other intervertebral disc degeneration, lumbosacral region: Secondary | ICD-10-CM | POA: Insufficient documentation

## 2020-12-04 MED ORDER — LIDOCAINE HCL 2 % IJ SOLN
INTRAMUSCULAR | Status: AC
  Filled 2020-12-04: qty 10

## 2020-12-04 NOTE — Patient Instructions (Signed)

## 2020-12-04 NOTE — Progress Notes (Signed)
Safety precautions to be maintained throughout the outpatient stay will include: orient to surroundings, keep bed in low position, maintain call bell within reach at all times, provide assistance with transfer out of bed and ambulation.  

## 2020-12-04 NOTE — Progress Notes (Signed)
PROVIDER NOTE: Information contained herein reflects review and annotations entered in association with encounter. Interpretation of such information and data should be left to medically-trained personnel. Information provided to patient can be located elsewhere in the medical record under "Patient Instructions". Document created using STT-dictation technology, any transcriptional errors that may result from process are unintentional.    Patient: Brian Spencer  Service Category: Procedure  Provider: Gaspar Cola, MD  DOB: [birth year]  DOS: 12/04/2020  Location: [facility]  MRN: [id number]  Setting: Ambulatory - outpatient  Referring Provider: No ref. provider found  Type: Established Patient  Specialty: Interventional Pain Management  PCP: Alleman   Primary Reason for Visit: Interventional Pain Management Treatment. CC: Back Pain (Lumbar worse on the right )  Procedure:          Type: Management of Intrathecal Drug Delivery System (IDDS) - Reservoir Refill [phone]). No rate change.    Indications: 1. Failed back surgical syndrome   2. Chronic low back pain (1ry area of Pain) (Bilateral) (R>L)   3. Chronic lower extremity pain (2ry area of Pain) (Right)   4. DDD (degenerative disc disease), lumbosacral   5. Fusion of lumbar spine   6. Chronic pain syndrome   7. Presence of intrathecal pump   8. Encounter for adjustment or management of infusion pump   9. Encounter for therapeutic procedure    Pain Assessment: Self-Reported Pain Score: 4 /10             Reported level is compatible with observation.        The patient recently had his intrathecal pump replaced by Dr. Deetta Perla.  The wound is well healed, no redness, no discharge, or any other evidence of infection.   Pharmacotherapy Assessment   Analgesic: Intrathecal Hydromorphone (Dilaudid) @ 2.399 mg/d. MME: 9.6 mg/day.   Monitoring: [place] PMP: PDMP not reviewed this encounter.       Pharmacotherapy:  No side-effects or adverse reactions reported. Compliance: No problems identified. Effectiveness: Clinically acceptable. Plan: Refer to "POC". UDS:  Summary  Date Value Ref Range Status  11/12/2015 FINAL  Final    Comment:    ==================================================================== TOXAS[facility]RE COMP DRUG ANALYSIS,UR ==================================================================== Test                             Result       Flag       Units Drug Present and Declared for Prescription Verification   7-aminoclonazepam              82           EXPECTED   ng/mg creat    7-aminoclonazepam is an expected metabolite of clonazepam. Source    of clonazepam is a scheduled prescription medication.   Trazodone                      PRESENT      EXPECTED   1,3 chlorophenyl piperazine    PRESENT      EXPECTED    1,3-chlorophenyl piperazine is an expected metabolite of    trazodone.   Verapamil                      PRESENT      EXPECTED Drug Present not Declared for Prescription Verification   Hydromorphone  1656         UNEXPECTED ng/mg creat    Hydromorphone may be administered as a scheduled prescription    medication; it is also an expected metabolite of hydrocodone. Drug Absent but Declared for Prescription Verification   Venlafaxine                    Not Detected UNEXPECTED ==================================================================== Test                      Result    Flag   Units      Ref Range   Creatinine              73               mg/dL      >=20 ==================================================================== Declared Medications:  The flagging and interpretation on this report are based on the  following declared medications.  Unexpected results may arise from  inaccuracies in the declared medications.  **Note: The testing scope of this panel includes these medications:  Clonazepam (Klonopin)  Trazodone (Desyrel)  Venlafaxine  (Effexor)  Verapamil (Calan)  **Note: The testing scope of this panel does not include following  reported medications:  Albuterol  Budenoside (Symbicort)  Chlorthalidone  Formoterol (Symbicort)  Multivitamin  Tamsulosin (Flomax)  Tiotropium (Spiriva)  Ubiquinone (Coenzyme Q 10)  Vitamin D2 (Ergocalciferol) ==================================================================== For clinical consultation, please call [phone]. ====================================================================         Intrathecal Drug Delivery System (IDDS)  Pump Device:  Manufacturer: Medtronic Model: Synchromed II Model No.: [id number] Serial No.: [id number] H Delivery Route: Intrathecal Type: Programmable  Volume (mL): 40 mL reservoir Priming Volume: 0.207 mL  Calibration Constant: 117.0  MRI compatibility: Conditional   Implant Details:  Date: 10/08/20 (Third Pump) Implanter: Dr. Deetta Perla (Last)  Contact Information: [facility], [place], [place] Last Revision/Replacement: 10/08/2020 Estimated Replacement Date: May 2029  Implant Site: Abdominal Laterality: Right  Catheter: Manufacturer: Medtronic Model:  n/a  Model No.: 8731  Serial No.: n/a  Implanted Length (cm): 94.1  Catheter Volume (mL): 0.207  Tip Location (Level): T10 Canal Access Site: L2-3  Drug content:  Primary Medication Class: Opioid  Medication: PF-Hydromorphone (Dilaudid)  Concentration: 2.5 mg/mL  Secondary Medication Class: Non-opioid analgesic  Medication: PF-Clonidine  Concentration: 50.0 mcg/mL  Tertiary Medication Class: Local Anesthetic  Medication: PF-Bupivacaine  Concentration: 10.0 mg/mL  Fourth Medication Class: none  Medication: n/a  Concentration: n/a  PTC/PCA parameters:  Mode: Off/Inactive  Programming:  Type: Simple continuous.  Medication, Concentration, Infusion Program, & Delivery Rate: For up-to-date details please see most recent scanned programming  printout.     Changes:  Medication Change: None at this point Rate Change: No change in rate  Reported side-effects or adverse reactions: None reported  Effectiveness: Described as relatively effective, allowing for increase in activities of daily living (ADL) Clinically meaningful improvement in function (CMIF): Sustained CMIF goals met  Plan: Pump refill today   Pre-op H&P Assessment:  Mr. Wilbon is a 70 y.o. (year old), male patient, seen today for interventional treatment. 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 Mr. Muchow has a current medication list which includes the following prescription(s): acetaminophen, albuterol, albuterol, amlodipine, atorvastatin, clonazepam, neuriva plus, coq10, furosemide, magnesium oxide, multivitamin, NON FORMULARY, oxygen-helium, pantoprazole, polyvinyl alcohol, propranolol, tamsulosin, tiotropium bromide-olodaterol, trazodone, and venlafaxine xr. His primarily concern today is the Back Pain (Lumbar worse on the right )  Initial Vital Signs:  Pulse/HCG Rate: 69  Temp: (!) 97.1 F (36.2 C) Resp: 16 BP: 140/68 SpO2: 99 %  BMI: Estimated body mass index is  40.77 kg/m as calculated from the following:   Height as of this encounter: 5' 5" (1.651 m).   Weight as of this encounter: 245 lb (111.1 kg).  Risk Assessment: Allergies: Reviewed. He is allergic to baclofen and hydralazine.  Allergy Precautions: None required Coagulopathies: Reviewed. None identified.  Blood-thinner therapy: None at this time Active Infection(s): Reviewed. None identified. Mr. Eden is afebrile  Site Confirmation: Mr. Crapps was asked to confirm the procedure and laterality before marking the site Procedure checklist: Completed Consent: Before the procedure and under the influence of no sedative(s), amnesic(s), or anxiolytics, the patient was informed of the treatment options, risks and possible complications. To fulfill our ethical and legal obligations, as recommended by the American Medical Association's Code of Ethics, I have informed the patient of my clinical impression; the nature and purpose of the treatment or procedure; the risks, benefits, and possible complications of the intervention; the alternatives, including doing nothing; the risk(s) and benefit(s) of the alternative treatment(s) or procedure(s); and the risk(s) and benefit(s) of doing nothing.  Mr. Clinard was provided with information about the general risks and possible complications associated with most interventional procedures. These include, but are not limited to: failure to achieve desired goals, infection, bleeding, organ or nerve damage, allergic reactions, paralysis, and/or death.  In addition, he was informed of those risks and possible complications associated to this particular procedure, which include, but are not limited to: damage to the implant; failure to decrease pain; local, systemic, or serious CNS infections, intraspinal abscess with possible cord compression and paralysis, or life-threatening such as meningitis; bleeding; organ damage; nerve injury or damage with subsequent sensory, motor,  and/or autonomic system dysfunction, resulting in transient or permanent pain, numbness, and/or weakness of one or several areas of the body; allergic reactions, either minor or major life-threatening, such as anaphylactic or anaphylactoid reactions.  Furthermore, Mr. Fera was informed of those risks and complications associated with the medications. These include, but are not limited to: allergic reactions (i.e.: anaphylactic or anaphylactoid reactions); endorphine suppression; bradycardia and/or hypotension; water retention and/or peripheral vascular relaxation leading to lower extremity edema and possible stasis ulcers; respiratory depression and/or shortness of breath; decreased metabolic rate leading to weight gain; swelling or edema; medication-induced neural toxicity; particulate matter embolism and blood vessel occlusion with resultant organ, and/or nervous system infarction; and/or intrathecal granuloma formation with possible spinal cord compression and permanent paralysis.  Before refilling the pump Mr. Wenke was informed that some of the medications used in the devise may not be FDA approved for such use and therefore it constitutes an off-label use of the medications.  Finally, he was informed that Medicine is not an exact science; therefore, there is also the possibility of unforeseen or unpredictable risks and/or possible complications that may result in a  catastrophic outcome. The patient indicated having understood very clearly. We have given the patient no guarantees and we have made no promises. Enough time was given to the patient to ask questions, all of which were answered to the patient's satisfaction. Mr. Cail has indicated that he wanted to continue with the procedure. Attestation: I, the ordering provider, attest that I have discussed with the patient the benefits, risks, side-effects, alternatives, likelihood of achieving goals, and potential problems during recovery for the  procedure that I have provided informed consent. Date  Time: 12/04/2020 11:47 AM  Pre-Procedure Preparation:  Monitoring: As per clinic protocol. Respiration, ETCO2, SpO2, BP, heart rate and rhythm monitor placed and checked for adequate function Safety Precautions: Patient was assessed for positional comfort and pressure points before starting the procedure. Time-out: I initiated and conducted the "Time-out" before starting the procedure, as per protocol. The patient was asked to participate by confirming the accuracy of the "Time Out" information. Verification of the correct person, site, and procedure were performed and confirmed by me, the nursing staff, and the patient. "Time-out" conducted as per Joint Commission's Universal Protocol (UP.01.01.01). Time: 1204  Description of Procedure:          Position: Supine Target Area: Central-port of intrathecal pump. Approach: Anterior, 90 degree angle approach. Area Prepped: Entire Area around the pump implant. DuraPrep (Iodine Povacrylex [0.7% available iodine] and Isopropyl Alcohol, 74% w/w) Safety Precautions: Aspiration looking for blood return was conducted prior to all injections. At no point did we inject any substances, as a needle was being advanced. No attempts were made at seeking any paresthesias. Safe injection practices and needle disposal techniques used. Medications properly checked for expiration dates. SDV (single dose vial) medications used. Description of the Procedure: Protocol guidelines were followed.  Today I was called in to assist with accessing the pump and to confirm of correct placement of the needle.  Initial attempt at accessing the pump yielded approximately 18 mL of serous fluid, which was later shown to be from around the pump implant, likely to be secondary to the recent implant of the new pump.  I personally accessed the central port and removed the medication within the pump, the amount of which was concordant with  expected volume.  Two nurses trained to do implant refills were present during the entire procedure. The refill medication was checked by both healthcare providers as well as the patient. The patient was included in the "Time-out" to verify the medication. The patient was placed in position. The pump was identified. The area was prepped in the usual manner. The sterile template was positioned over the pump, making sure the side-port location matched that of the pump. Both, the pump and the template were held for stability. The needle provided in the Medtronic Kit was then introduced thru the center of the template and into the central port. The pump content was aspirated and discarded volume documented. The new medication was slowly infused into the pump, thru the filter, making sure to avoid overpressure of the device. The needle was then removed and the area cleansed, making sure to leave some of the prepping solution back to take advantage of its long term bactericidal properties. The pump was interrogated and programmed to reflect the correct medication, volume, and dosage. The program was printed and taken to the physician for approval. Once checked and signed by the physician, a copy was provided to the patient and another scanned into the EMR. Vitals:   12/04/20 1144  BP: 140/68  Pulse: 69  Resp: 16  Temp: (!) 97.1 F (36.2 C)  TempSrc: Temporal  SpO2: 99%  Weight: 245 lb (111.1 kg)  Height: 5' 5" (1.651 m)    Start Time: 1204 hrs. End Time: 1227 hrs. Materials & Medications: Medtronic Refill Kit Medication(s): Please see chart orders for details.  Imaging Guidance:          Type of Imaging Technique: None used Indication(s): N/A Exposure Time: No patient exposure Contrast: None used. Fluoroscopic Guidance: N/A Ultrasound Guidance: N/A Interpretation: N/A  Antibiotic Prophylaxis:   Anti-infectives (From admission, onward)    None      Indication(s): None  identified  Post-operative Assessment:  Post-procedure Vital Signs:  Pulse/HCG Rate: 69  Temp: (!) 97.1 F (36.2 C) Resp: 16 BP: 140/68 SpO2: 99 %  EBL: None  Complications: No immediate post-treatment complications observed by team, or reported by patient.  Note: The patient tolerated the entire procedure well. A repeat set of vitals were taken after the procedure and the patient was kept under observation following institutional policy, for this type of procedure. Post-procedural neurological assessment was performed, showing return to baseline, prior to discharge. The patient was provided with post-procedure discharge instructions, including a section on how to identify potential problems. Should any problems arise concerning this procedure, the patient was given instructions to immediately contact us, at any time, without hesitation. In any case, we plan to contact the patient by telephone for a follow-up status report regarding this interventional procedure.  Comments:  No additional relevant information.  Plan of Care  Orders:  Orders Placed This Encounter  Procedures   PUMP REFILL    Maintain Protocol by having two(2) healthcare providers during procedure and programming.    Scheduling Instructions:     Please refill intrathecal pump today.    Order Specific Question:   Where will this procedure be performed?    Answer:   [facility]   PUMP REFILL    Whenever possible schedule on a procedure today.    Standing Status:   Future    Standing Expiration Date:   04/06/2021    Scheduling Instructions:     Please schedule intrathecal pump refill based on pump programming. Avoid schedule intervals of more than 120 days (4 months).    Order Specific Question:   Where will this procedure be performed?    Answer:   [facility]   Informed Consent Details: Physician/Practitioner Attestation; Transcribe to consent form and obtain patient signature    Transcribe to  consent form and obtain patient signature.    Order Specific Question:   Physician/Practitioner attestation of informed consent for procedure/surgical case    Answer:   I, the physician/practitioner, attest that I have discussed with the patient the benefits, risks, side effects, alternatives, likelihood of achieving goals and potential problems during recovery for the procedure that I have provided informed consent.    Order Specific Question:   Procedure    Answer:   Intrathecal pump refill    Order Specific Question:   Physician/Practitioner performing the procedure    Answer:   Attending Physician: Kathlen Brunswick. Dossie Arbour, MD & designated trained staff    Order Specific Question:   Indication/Reason    Answer:   Chronic Pain Syndrome (G89.4), presence of an intrathecal pump (Z97.8)    Chronic Opioid Analgesic:  Intrathecal Hydromorphone (Dilaudid) @ 2.399 mg/d. MME: 9.6 mg/day.   Medications ordered for procedure: No orders of the defined types  were placed in this encounter.  Medications administered: Axten Pascucci had no medications administered during this visit.  See the medical record for exact dosing, route, and time of administration.  Follow-up plan:   Return for Pump Refill (Max:42mo.       Interventional management options: Planned, scheduled, and/or pending:      Considering:   NOTE: ELIQUIS ANTICOAGULATION (Stop: 3days  Re-start: 6 hrs) Diagnostic bilateral lumbar facet blocks #1    Palliative PRN treatment(s):   Continue management of intrathecal pump        Recent Visits Date Type Provider Dept  09/25/20 Procedure visit NMilinda Pointer MD [facility]  Showing recent visits within past 90 days and meeting all other requirements Today's Visits Date Type Provider Dept  12/04/20 Procedure visit NMilinda Pointer MD [facility]  Showing today's visits and meeting all other requirements Future Appointments Date Type Provider Dept  02/14/21  Appointment NMilinda Pointer MD [facility]  Showing future appointments within next 90 days and meeting all other requirements Disposition: Discharge home  Discharge (Date  Time): 12/04/2020; 1245 hrs.   Primary Care Physician: [place] [facility] Note by: FGaspar Cola MD Date: 12/04/2020; Time: 12:44 PM  Disclaimer:  Medicine is not an eChief Strategy Officer The only guarantee in medicine is that nothing is guaranteed. It is important to note that the decision to proceed with this intervention was based on the information collected from the patient. The Data and conclusions were drawn from the patient's questionnaire, the interview, and the physical examination. Because the information was provided in large part by the patient, it cannot be guaranteed that it has not been purposely or unconsciously manipulated. Every effort has been made to obtain as much relevant data as possible for this evaluation. It is important to note that the conclusions that lead to this procedure are derived in large part from the available data. Always take into account that the treatment will also be dependent on availability of resources and existing treatment guidelines, considered by other Pain Management Practitioners as being common knowledge and practice, at the time of the intervention. For Medico-Legal purposes, it is also important to point out that variation in procedural techniques and pharmacological choices are the acceptable norm. The indications, contraindications, technique, and results of the above procedure should only be interpreted and judged by a Board-Certified Interventional Pain Specialist with extensive familiarity and expertise in the same exact procedure and technique.

## 2020-12-05 ENCOUNTER — Telehealth: Payer: Self-pay | Admitting: *Deleted

## 2020-12-05 MED FILL — Medication: INTRATHECAL | Qty: 1 | Status: AC

## 2020-12-05 NOTE — Telephone Encounter (Signed)
No problems post pump refill.

## 2021-01-18 ENCOUNTER — Other Ambulatory Visit: Payer: Self-pay

## 2021-01-18 MED ORDER — PAIN MANAGEMENT IT PUMP REFILL: 1.0000 | Freq: Once | INTRATHECAL | 0 refills | Status: AC

## 2021-01-23 ENCOUNTER — Other Ambulatory Visit: Payer: Self-pay

## 2021-01-23 MED ORDER — PAIN MANAGEMENT IT PUMP REFILL: 1.0000 | Freq: Once | INTRATHECAL | 0 refills | Status: AC

## 2021-02-13 NOTE — Progress Notes (Signed)
PROVIDER NOTE: Interpretation of information contained herein should be left to medically-trained personnel. Specific patient instructions are provided elsewhere under "Patient Instructions" section of medical record. This document was created in part using STT-dictation technology, any transcriptional errors that may result from this process are unintentional.  Patient: Brian Spencer Type: Established DOB: [id number] MRN: [id number] [facility]: [facility]  Service: Procedure DOS: 02/14/2021 Setting: Ambulatory Location: Ambulatory outpatient facility Delivery: Face-to-face Provider: Gaspar Cola, MD Specialty: Interventional Pain Management Specialty designation: 09 Location: Outpatient facility Ref. Prov.: [place]    Primary Reason for Visit: Interventional Pain Management Treatment. CC: Back Pain (Lumbar bilateral right is worse )  Procedure:          Type: Management of Intrathecal Drug Delivery System (IDDS) - Reservoir Refill [phone]). No rate change.  Indications: 1. Failed back surgical syndrome   2. Chronic low back pain (1ry area of Pain) (Bilateral) (R>L)   3. Chronic lower extremity pain (2ry area of Pain) (Right)   4. DDD (degenerative disc disease), lumbosacral   5. Fusion of lumbar spine   6. Chronic pain syndrome   7. Presence of intrathecal pump   8. Encounter for adjustment or management of infusion pump   9. Encounter for therapeutic procedure    Pain Assessment: Self-Reported Pain Score: 4 /10             Reported level is compatible with observation.         Today the patient has requested that on his next refill, we decrease his opioid concentration even further.  He denies any side effects or adverse reactions to his current medication or dose.  During the procedure of accessing the pump, the patient did have a spasm of his lower back, one related to the procedure that we were performing, but because he continues to have those, we will keep the  baclofen on the pump, as is.  On the next refill we will lower the concentration of the hydromorphone from 2.5 mg/mL to 1.25 mg/mL in addition, we will switch to primary medication on the pump to be that of the bupivacaine.  Intrathecal Drug Delivery System (IDDS)  Pump Device:  Manufacturer: Medtronic Model: Synchromed II Model No.: [id number] Serial No.: [id number] H Delivery Route: Intrathecal Type: Programmable  Volume (mL): 40 mL reservoir Priming Volume: 0.207 mL  Calibration Constant: 117.0  MRI compatibility: Conditional   Implant Details:  Date: 10/08/20 (Third Pump) Implanter: Dr. Deetta Perla (Last)  Contact Information: [facility], [place], [place] Last Revision/Replacement: 10/08/2020 Estimated Replacement Date: May 2029  Implant Site: Abdominal Laterality: Right  Catheter: Manufacturer: Medtronic Model:  n/a  Model No.: 8731  Serial No.: n/a  Implanted Length (cm): 94.1  Catheter Volume (mL): 0.207  Tip Location (Level): T10 Canal Access Site: L2-3  Drug content:  Primary Medication Class: Opioid  Medication: PF-Hydromorphone (Dilaudid)  Concentration: 2.5 mg/mL  Secondary Medication Class: Non-opioid analgesic  Medication: PF-Clonidine  Concentration: 50.0 mcg/mL  Tertiary Medication Class: Local Anesthetic  Medication: PF-Bupivacaine  Concentration: 10.0 mg/mL  Fourth Medication Class: none  Medication: n/a  Concentration: n/a  PTC/PCA parameters:  Mode: Off/Inactive  Programming:  Type: Simple continuous.  Medication, Concentration, Infusion Program, & Delivery Rate: For up-to-date details please see most recent scanned programming printout.     Changes:  Medication Change: None at this point Rate Change: No change in rate  Reported side-effects or adverse reactions: None reported  Effectiveness: Described as relatively effective, allowing for increase in activities  of daily living (ADL) Clinically meaningful improvement in  function (CMIF): Sustained CMIF goals met  Plan: Pump refill today   Pharmacotherapy Assessment   Opioid Analgesic: Intrathecal Hydromorphone (Dilaudid) @ 2.399 mg/d. MME: 9.6 mg/day.   Monitoring: [place] PMP: PDMP reviewed during this encounter.       Pharmacotherapy: No side-effects or adverse reactions reported. Compliance: No problems identified. Effectiveness: Clinically acceptable. Plan: Refer to "POC". UDS:  Summary  Date Value Ref Range Status  11/12/2015 FINAL  Final    Comment:    ==================================================================== TOXAS[facility]RE COMP DRUG ANALYSIS,UR ==================================================================== Test                             Result       Flag       Units Drug Present and Declared for Prescription Verification   7-aminoclonazepam              82           EXPECTED   ng/mg creat    7-aminoclonazepam is an expected metabolite of clonazepam. Source    of clonazepam is a scheduled prescription medication.   Trazodone                      PRESENT      EXPECTED   1,3 chlorophenyl piperazine    PRESENT      EXPECTED    1,3-chlorophenyl piperazine is an expected metabolite of    trazodone.   Verapamil                      PRESENT      EXPECTED Drug Present not Declared for Prescription Verification   Hydromorphone                  1656         UNEXPECTED ng/mg creat    Hydromorphone may be administered as a scheduled prescription    medication; it is also an expected metabolite of hydrocodone. Drug Absent but Declared for Prescription Verification   Venlafaxine                    Not Detected UNEXPECTED ==================================================================== Test                      Result    Flag   Units      Ref Range   Creatinine              73               mg/dL      >=20 ==================================================================== Declared Medications:  The flagging and interpretation on this  report are based on the  following declared medications.  Unexpected results may arise from  inaccuracies in the declared medications.  **Note: The testing scope of this panel includes these medications:  Clonazepam (Klonopin)  Trazodone (Desyrel)  Venlafaxine (Effexor)  Verapamil (Calan)  **Note: The testing scope of this panel does not include following  reported medications:  Albuterol  Budenoside (Symbicort)  Chlorthalidone  Formoterol (Symbicort)  Multivitamin  Tamsulosin (Flomax)  Tiotropium (Spiriva)  Ubiquinone (Coenzyme Q 10)  Vitamin D2 (Ergocalciferol) ==================================================================== For clinical consultation, please call [phone]. ====================================================================      Pre-op H&P Assessment:  Mr. Schwarz is a 70 y.o. (year old), male patient, seen today for interventional treatment. 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 Mr. Pianka has a current medication list which includes the following prescription(s): acetaminophen, albuterol, albuterol, amlodipine, atorvastatin, clonazepam, neuriva plus, coq10,  furosemide, magnesium oxide, multivitamin, NON FORMULARY, oxygen-helium, pantoprazole, polyvinyl alcohol, propranolol, tamsulosin, tiotropium bromide-olodaterol, trazodone, venlafaxine xr, and [START ON 05/10/2021] PAIN MANAGEMENT IT PUMP REFILL. His primarily concern today is the Back Pain (Lumbar bilateral right is worse )  Initial Vital Signs:  Pulse/HCG Rate: 81  Temp: (!) 97.1 F (36.2 C) Resp: 16 BP: 102/77 SpO2: 97 % (3 liters)  BMI: Estimated body mass index is 40.77 kg/m as calculated from the following:   Height as of this encounter: 5' 5"  (1.651 m).   Weight as of this encounter: 245 lb (111.1 kg).  Risk Assessment: Allergies: Reviewed. He is allergic to baclofen and hydralazine.  Allergy Precautions: None required Coagulopathies: Reviewed. None identified.  Blood-thinner therapy: None at this time Active Infection(s): Reviewed. None identified. Mr. Ruppe is afebrile  Site Confirmation: Mr. Gasparro was asked to confirm the procedure and laterality before marking the site Procedure checklist: Completed Consent: Before the procedure and under the influence of no sedative(s), amnesic(s), or anxiolytics, the patient was informed of the treatment options, risks and possible complications. To fulfill our ethical and legal obligations, as recommended by the American Medical Association's Code of Ethics, I have informed the patient of my clinical impression; the nature and purpose of the treatment or procedure; the risks, benefits, and possible complications of the intervention; the alternatives, including doing nothing; the risk(s) and benefit(s) of the alternative treatment(s) or procedure(s); and the risk(s) and benefit(s) of doing nothing.  Mr. Madero was provided with information about the general risks and possible complications associated with most interventional procedures. These include, but are not limited to: failure to achieve desired goals, infection, bleeding, organ or nerve  damage, allergic reactions, paralysis, and/or death.  In addition, he was informed of those risks and possible complications associated to this particular procedure, which include, but are not limited to: damage to the implant; failure to decrease pain; local, systemic, or serious CNS infections, intraspinal abscess with possible cord compression and paralysis, or life-threatening such as meningitis; bleeding; organ damage; nerve injury or damage with subsequent sensory, motor, and/or autonomic system dysfunction, resulting in transient or permanent pain, numbness, and/or weakness of one or several areas of the body; allergic reactions, either minor or major life-threatening, such as anaphylactic or anaphylactoid reactions.  Furthermore, Mr. Froh was informed of those risks and complications associated with the medications. These include, but are not limited to: allergic reactions (i.e.: anaphylactic or anaphylactoid reactions); endorphine suppression; bradycardia and/or hypotension; water retention and/or peripheral vascular relaxation leading to lower extremity edema and possible stasis ulcers; respiratory depression and/or shortness of breath; decreased metabolic rate leading to weight gain; swelling or edema; medication-induced neural toxicity; particulate matter embolism and blood vessel occlusion with resultant organ, and/or nervous system infarction; and/or intrathecal granuloma formation with possible  spinal cord compression and permanent paralysis.  Before refilling the pump Mr. Carreiro was informed that some of the medications used in the devise may not be FDA approved for such use and therefore it constitutes an off-label use of the medications.  Finally, he was informed that Medicine is not an exact science; therefore, there is also the possibility of unforeseen or unpredictable risks and/or possible complications that may result in a catastrophic outcome. The patient indicated having understood  very clearly. We have given the patient no guarantees and we have made no promises. Enough time was given to the patient to ask questions, all of which were answered to the patient's satisfaction. Mr. Angus has indicated that he wanted to continue with the procedure. Attestation: I, the ordering provider, attest that I have discussed with the patient the benefits, risks, side-effects, alternatives, likelihood of achieving goals, and potential problems during recovery for the procedure that I have provided informed consent. Date   Time: 02/14/2021 11:59 AM  Pre-Procedure Preparation:  Monitoring: As per clinic protocol. Respiration, ETCO2, SpO2, BP, heart rate and rhythm monitor placed and checked for adequate function Safety Precautions: Patient was assessed for positional comfort and pressure points before starting the procedure. Time-out: I initiated and conducted the "Time-out" before starting the procedure, as per protocol. The patient was asked to participate by confirming the accuracy of the "Time Out" information. Verification of the correct person, site, and procedure were performed and confirmed by me, the nursing staff, and the patient. "Time-out" conducted as per Joint Commission's Universal Protocol (UP.01.01.01). Time: 1207  Description of Procedure:          Position: Supine Target Area: Central-port of intrathecal pump. Approach: Anterior, 90 degree angle approach. Area Prepped: Entire Area around the pump implant. DuraPrep (Iodine Povacrylex [0.7% available iodine] and Isopropyl Alcohol, 74% w/w) Safety Precautions: Aspiration looking for blood return was conducted prior to all injections. At no point did we inject any substances, as a needle was being advanced. No attempts were made at seeking any paresthesias. Safe injection practices and needle disposal techniques used. Medications properly checked for expiration dates. SDV (single dose vial) medications used. Description of the  Procedure: Protocol guidelines were followed. Two nurses trained to do implant refills were present during the entire procedure. The refill medication was checked by both healthcare providers as well as the patient. The patient was included in the "Time-out" to verify the medication. The patient was placed in position. The pump was identified. The area was prepped in the usual manner. The sterile template was positioned over the pump, making sure the side-port location matched that of the pump. Both, the pump and the template were held for stability. The needle provided in the Medtronic Kit was then introduced thru the center of the template and into the central port.   It is at this point that I was called in by the nursing staff indicating that there were having difficulty trying to empty the pump.  I noticed that the angle on the needle was a little bit more steep than I would have liked and therefore I removed it and reinserted the needle into the central port.  It took me a while to identify it at first, but once I was able to access it, then there was absolutely no problems pulling the expected medication out of the pump.  We did notice that the patient was leaking some fluid from the prior abdominal wall skin puncture.  We did try to  aspirate, but we were unable to get anything back.  The fluid that was coming out was completely clear with no apparent evidence of infection.  The patient was not having any tenderness, redness, or increased temperature over the pump.  He also denied any generalized malaise, night sweats, or fever.  The pump content was aspirated and discarded volume documented. The new medication was slowly infused into the pump, thru the filter, making sure to avoid overpressure of the device. The needle was then removed and the area cleansed, making sure to leave some of the prepping solution back to take advantage of its long term bactericidal properties. The pump was interrogated and  programmed to reflect the correct medication, volume, and dosage. The program was printed and taken to the physician for approval. Once checked and signed by the physician, a copy was provided to the patient and another scanned into the EMR.  Vitals:   02/14/21 1157  BP: 102/77  Pulse: 81  Resp: 16  Temp: (!) 97.1 F (36.2 C)  TempSrc: Temporal  SpO2: 97%  Weight: 245 lb (111.1 kg)  Height: 5' 5"  (1.651 m)    Start Time: 1207 hrs. End Time: 7619 (MD in for assist) hrs. Materials & Medications: Medtronic Refill Kit Medication(s): Please see chart orders for details.  Imaging Guidance:          Type of Imaging Technique: None used Indication(s): N/A Exposure Time: No patient exposure Contrast: None used. Fluoroscopic Guidance: N/A Ultrasound Guidance: N/A Interpretation: N/A  Antibiotic Prophylaxis:   Anti-infectives (From admission, onward)    None      Indication(s): None identified  Post-operative Assessment:  Post-procedure Vital Signs:  Pulse/HCG Rate: 81  Temp: (!) 97.1 F (36.2 C) Resp: 16 BP: 102/77 SpO2: 97 % (3 liters)  EBL: None  Complications: No immediate post-treatment complications observed by team, or reported by patient.  Note: The patient tolerated the entire procedure well. A repeat set of vitals were taken after the procedure and the patient was kept under observation following institutional policy, for this type of procedure. Post-procedural neurological assessment was performed, showing return to baseline, prior to discharge. The patient was provided with post-procedure discharge instructions, including a section on how to identify potential problems. Should any problems arise concerning this procedure, the patient was given instructions to immediately contact us, at any time, without hesitation. In any case, we plan to contact the patient by telephone for a follow-up status report regarding this interventional procedure.  Comments:  No  additional relevant information.  Plan of Care  Orders:  Orders Placed This Encounter  Procedures   PUMP REFILL    Maintain Protocol by having two(2) healthcare providers during procedure and programming.    Scheduling Instructions:     Please refill intrathecal pump today.    Order Specific Question:   Where will this procedure be performed?    Answer:   [facility]   PUMP REFILL    Whenever possible schedule on a procedure today.    Standing Status:   Future    Standing Expiration Date:   06/14/2021    Scheduling Instructions:     Please schedule intrathecal pump refill based on pump programming. Avoid schedule intervals of more than 120 days (4 months).    Order Specific Question:   Where will this procedure be performed?    Answer:   [facility]   Informed Consent Details: Physician/Practitioner Attestation; Transcribe to consent form and obtain patient signature  Transcribe to consent form and obtain patient signature.    Order Specific Question:   Physician/Practitioner attestation of informed consent for procedure/surgical case    Answer:   I, the physician/practitioner, attest that I have discussed with the patient the benefits, risks, side effects, alternatives, likelihood of achieving goals and potential problems during recovery for the procedure that I have provided informed consent.    Order Specific Question:   Procedure    Answer:   Intrathecal pump refill    Order Specific Question:   Physician/Practitioner performing the procedure    Answer:   Attending Physician: Kathlen Brunswick. Dossie Arbour, MD & designated trained staff    Order Specific Question:   Indication/Reason    Answer:   Chronic Pain Syndrome (G89.4), presence of an intrathecal pump (Z97.8)   Chronic Opioid Analgesic:  Intrathecal Hydromorphone (Dilaudid) @ 2.399 mg/d. MME: 9.6 mg/day.   Medications ordered for procedure: Meds ordered this encounter  Medications   PAIN MANAGEMENT IT PUMP  REFILL    Sig: 1 each by Intrathecal route once for 1 dose. Medication: 1st Opioid: PF-Hydromorphone 1/25 mg/mL 2nd Opioid: None   Local Anesthetic: PF-Bupivacaine 10 mg/ml 1st Adjuvant: None   2nd Adjuvant: PF-Baclofen 50 mcg/ml Volume: 40 ml Appointment Date: 04/18/21    Dispense:  1 each    Refill:  0    To be used as a refill for the intrathecal pumps by the pain management staff.   Medications administered: Council Munguia had no medications administered during this visit.  See the medical record for exact dosing, route, and time of administration.  Follow-up plan:   Return for Pump Refill (Max:52mo.       Interventional Therapies  Risk   Complexity Considerations:   Estimated body mass index is 40.77 kg/m as calculated from the following:   Height as of this encounter: 5' 5"  (1.651 m).   Weight as of this encounter: 245 lb (111.1 kg). NOTE: ELIQUIS ANTICOAGULATION (Stop: 3days   Re-start: 6 hrs)   Planned   Pending:   (02/14/2021) the plan is to continue slowly decreasing the amount of opioids that he is getting through the pump and trying to control the pain with the local anesthetic and baclofen that is currently running through it.  On the patient's next refill we will lower the hydromorphone concentration from 2.5 mg/mL to 1.25 mg/mL.  In addition, we will change the primary medication in the pump to be the local anesthetic.   Under consideration:   Continue management of intrathecal pump  Diagnostic bilateral lumbar facet blocks #1    Completed:   None at this time   Therapeutic   Palliative (PRN) options:   Continue management of intrathecal pump     Recent Visits Date Type Provider Dept  12/04/20 Procedure visit NMilinda Pointer MD [facility]  Showing recent visits within past 90 days and meeting all other requirements Today's Visits Date Type Provider Dept  02/14/21 Procedure visit NMilinda Pointer MD [facility]  Showing today's  visits and meeting all other requirements Future Appointments Date Type Provider Dept  04/18/21 Appointment NMilinda Pointer MD [facility]  Showing future appointments within next 90 days and meeting all other requirements  Disposition: Discharge home  Discharge (Date   Time): 02/14/2021;   hrs.   Primary Care Physician[facility] Note by: FGaspar Cola MD Date: 02/14/2021; Time: 2:15 PM  Disclaimer:  Medicine is not an exact  science. The only guarantee in medicine is that nothing is guaranteed. It is important to note that the decision to proceed with this intervention was based on the information collected from the patient. The Data and conclusions were drawn from the patient's questionnaire, the interview, and the physical examination. Because the information was provided in large part by the patient, it cannot be guaranteed that it has not been purposely or unconsciously manipulated. Every effort has been made to obtain as much relevant data as possible for this evaluation. It is important to note that the conclusions that lead to this procedure are derived in large part from the available data. Always take into account that the treatment will also be dependent on availability of resources and existing treatment guidelines, considered by other Pain Management Practitioners as being common knowledge and practice, at the time of the intervention. For Medico-Legal purposes, it is also important to point out that variation in procedural techniques and pharmacological choices are the acceptable norm. The indications, contraindications, technique, and results of the above procedure should only be interpreted and judged by a Board-Certified Interventional Pain Specialist with extensive familiarity and expertise in the same exact procedure and technique.

## 2021-02-14 ENCOUNTER — Ambulatory Visit: Payer: Worker's Compensation | Attending: Pain Medicine | Admitting: Pain Medicine

## 2021-02-14 ENCOUNTER — Encounter: Payer: Self-pay | Admitting: Pain Medicine

## 2021-02-14 ENCOUNTER — Other Ambulatory Visit: Payer: Self-pay

## 2021-02-14 VITALS — BP 102/77 | HR 81 | Temp 97.1°F | Resp 16 | Ht 65.0 in | Wt 245.0 lb

## 2021-02-14 DIAGNOSIS — M5441 Lumbago with sciatica, right side: Secondary | ICD-10-CM | POA: Insufficient documentation

## 2021-02-14 DIAGNOSIS — Z978 Presence of other specified devices: Secondary | ICD-10-CM | POA: Diagnosis present

## 2021-02-14 DIAGNOSIS — M79604 Pain in right leg: Secondary | ICD-10-CM | POA: Diagnosis present

## 2021-02-14 DIAGNOSIS — M961 Postlaminectomy syndrome, not elsewhere classified: Secondary | ICD-10-CM | POA: Diagnosis not present

## 2021-02-14 DIAGNOSIS — G894 Chronic pain syndrome: Secondary | ICD-10-CM | POA: Insufficient documentation

## 2021-02-14 DIAGNOSIS — Z5189 Encounter for other specified aftercare: Secondary | ICD-10-CM | POA: Insufficient documentation

## 2021-02-14 DIAGNOSIS — Z451 Encounter for adjustment and management of infusion pump: Secondary | ICD-10-CM | POA: Diagnosis present

## 2021-02-14 DIAGNOSIS — M4326 Fusion of spine, lumbar region: Secondary | ICD-10-CM | POA: Insufficient documentation

## 2021-02-14 DIAGNOSIS — G8929 Other chronic pain: Secondary | ICD-10-CM | POA: Insufficient documentation

## 2021-02-14 DIAGNOSIS — M5137 Other intervertebral disc degeneration, lumbosacral region: Secondary | ICD-10-CM | POA: Diagnosis present

## 2021-02-14 MED ORDER — PAIN MANAGEMENT IT PUMP REFILL
1.0000 | Freq: Once | INTRATHECAL | 0 refills | Status: DC
Start: 2021-05-10 — End: 2021-03-25

## 2021-02-14 NOTE — Patient Instructions (Signed)
Opioid Overdose ?Opioids are drugs that are often used to treat pain. Opioids include illegal drugs, such as heroin, as well as prescription pain medicines, such as codeine, morphine, hydrocodone, and fentanyl. ?An opioid overdose happens when you take too much of an opioid. An overdose may be intentional or accidental and can happen with any type of opioid. ?The effects of an overdose can be mild, dangerous, or even deadly. Opioid overdose is a medical emergency. ?What are the causes? ?This condition may be caused by: ?Taking too much of an opioid on purpose. ?Taking too much of an opioid by accident. ?Using two or more substances that contain opioids at the same time. ?Taking an opioid with a substance that affects your heart, breathing, or blood pressure. These include alcohol, tranquilizers, sleeping pills, illegal drugs, and some over-the-counter medicines. ?This condition may also happen due to an error made by: ?A health care provider who prescribes a medicine. ?The pharmacist who fills the prescription. ?What increases the risk? ?This condition is more likely in: ?Children. They may be attracted to colorful pills. Because of a child's small size, even a small amount of a medicine can be dangerous. ?Older people. They may be taking many different medicines. Older people may have difficulty reading labels or remembering when they last took their medicines. They may also be more sensitive to the effects of opioids. ?People with chronic medical conditions, especially heart, liver, kidney, or neurological diseases. ?People who take an opioid for a long period of time. ?People who take opioids and use illegal drugs, such as heroin, or other substances, such as alcohol. ?People who: ?Have a history of drug or alcohol abuse. ?Have certain mental health conditions. ?Have a history of previous drug overdoses. ?People who take opioids that are not prescribed for them. ?What are the signs or symptoms? ?Symptoms of this  condition depend on the type of opioid and the amount that was taken. Common symptoms include: ?Sleepiness or difficulty waking from sleep. ?Confusion. ?Slurred speech. ?Slowed breathing and a slow pulse (bradycardia). ?Nausea and vomiting. ?Abnormally small pupils. ?Signs and symptoms that require emergency treatment include: ?Cold, clammy, and pale skin. ?Blue lips and fingernails. ?Vomiting. ?Gurgling sounds in the throat. ?A pulse that is very slow or difficult to detect. ?Breathing that is very irregular, slow, noisy, or difficult to detect. ?Inability to respond to speech or be awakened from sleep (stupor). ?Seizures. ?How is this diagnosed? ?This condition is diagnosed based on your symptoms and medical history. It is important to tell your health care provider: ?About all of the opioids that you took. ?When you took the opioids. ?Whether you were drinking alcohol or using marijuana, cocaine, or other drugs. ?Your health care provider will do a physical exam. This exam may include: ?Checking and monitoring your heart rate and rhythm, breathing rate, temperature, and blood pressure. ?Measuring oxygen levels in your blood. ?Checking for abnormally small pupils. ?You may also have blood tests or urine tests. You may have X-rays if you are having severe breathing problems. ?How is this treated? ?This condition requires immediate medical treatment and hospitalization. Reversing the effects of the opioid is the first step in treatment. If you have a Narcan kit or naloxone, use it right away. Follow your health care provider's instructions. A friend or family member can also help you with this. ?The rest of your treatment will be given in the hospital intensive care (ICU). Treatment in the hospital may include: ?Giving salts and minerals (electrolytes) along with fluids through   an IV. ?Inserting a breathing tube (endotracheal tube) in your airway to help you breathe if you cannot breathe on your own or you are in  danger of not being able to breathe on your own. ?Giving oxygen through a small tube under your nose. ?Passing a tube through your nose and into your stomach (nasogastric tube, or NG tube) to empty your stomach. ?Giving medicines that: ?Increase your blood pressure. ?Relieve nausea and vomiting. ?Relieve abdominal pain and cramping. ?Reverse the effects of the opioid (naloxone). ?Monitoring your heart and oxygen levels. ?Ongoing counseling and mental health support if you intentionally overdosed or used an illegal drug. ?Follow these instructions at home: ?Medicines ?Take over-the-counter and prescription medicines only as told by your health care provider. ?Always ask your health care provider about possible side effects and interactions of any new medicine that you start taking. ?Keep a list of all the medicines that you take, including over-the-counter medicines. Bring this list with you to all your medical visits. ?General instructions ?Drink enough fluid to keep your urine pale yellow. ?Keep all follow-up visits. This is important. ?How is this prevented? ?Read the drug inserts that come with your opioid pain medicines. ?Take medicines only as told by your health care provider. Do not take more medicine than you are told. Do not take medicines more frequently than you are told. ?Do not drink alcohol or take sedatives when taking opioids. ?Do not use illegal or recreational drugs, including cocaine, ecstasy, and marijuana. ?Do not take opioid medicines that are not prescribed for you. ?Store all medicines in safety containers that are out of the reach of children. ?Get help if you are struggling with: ?Alcohol or drug use. ?Depression or another mental health problem. ?Thoughts of hurting yourself or another person. ?Keep the phone number of your local poison control center near your phone or in your mobile phone. In the [place] the hotline of the National Poison Control Center is (800) 222-1222. ?If you were  prescribed naloxone, make sure you understand how to take it. ?Contact a health care provider if: ?You need help understanding how to take your pain medicines. ?You feel your medicines are too strong. ?You are concerned that your pain medicines are not working well for your pain. ?You develop new symptoms or side effects when you are taking medicines. ?Get help right away if: ?You or someone else is having symptoms of an opioid overdose. Get help even if you are not sure. ?You have thoughts about hurting yourself or others. ?You have: ?Chest pain. ?Difficulty breathing. ?A loss of consciousness. ?These symptoms may represent a serious problem that is an emergency. Do not wait to see if the symptoms will go away. Get medical help right away. Call your local emergency services (911 in the [place] Do not drive yourself to the hospital. ?If you ever feel like you may hurt yourself or others, or have thoughts about taking your own life, get help right away. You can go to your nearest emergency department or: ?Call your local emergency services (911 in the [place] ?Call a suicide crisis helpline, such as the National Suicide Prevention Lifeline at 1-800-273-8255 or 988 in the [place] This is open 24 hours a day in the [place] ?Text the Crisis Text Line at 741741 (in the [place] ?Summary ?Opioids are drugs that are often used to treat pain. Opioids include illegal drugs, such as heroin, as well as prescription pain medicines. ?An opioid overdose happens when you take too much of an opioid. ?  Overdoses can be intentional or accidental. ?Opioid overdose is very dangerous. It is a life-threatening emergency. ?If you or someone you know is experiencing an opioid overdose, get help right away. ?This information is not intended to replace advice given to you by your health care provider. Make sure you discuss any questions you have with your health care provider. ?Document Revised: 08/29/2020 Document Reviewed: 05/16/2020 ?[employer]  Patient Education ? 2022 [employer] ? ?

## 2021-02-14 NOTE — Progress Notes (Signed)
Safety precautions to be maintained throughout the outpatient stay will include: orient to surroundings, keep bed in low position, maintain call bell within reach at all times, provide assistance with transfer out of bed and ambulation.  

## 2021-02-15 ENCOUNTER — Telehealth: Payer: Self-pay

## 2021-02-15 NOTE — Telephone Encounter (Signed)
Post procedure phone call. Patient states he is doing well.  

## 2021-02-18 MED FILL — Medication: INTRATHECAL | Qty: 1 | Status: AC

## 2021-03-21 ENCOUNTER — Encounter: Payer: Self-pay | Admitting: Pain Medicine

## 2021-03-25 ENCOUNTER — Other Ambulatory Visit: Payer: Self-pay | Admitting: Pain Medicine

## 2021-03-25 ENCOUNTER — Other Ambulatory Visit: Payer: Self-pay

## 2021-03-25 DIAGNOSIS — M5441 Lumbago with sciatica, right side: Secondary | ICD-10-CM

## 2021-03-25 DIAGNOSIS — Z978 Presence of other specified devices: Secondary | ICD-10-CM

## 2021-03-25 DIAGNOSIS — Z5189 Encounter for other specified aftercare: Secondary | ICD-10-CM

## 2021-03-25 DIAGNOSIS — M961 Postlaminectomy syndrome, not elsewhere classified: Secondary | ICD-10-CM

## 2021-03-25 DIAGNOSIS — M4326 Fusion of spine, lumbar region: Secondary | ICD-10-CM

## 2021-03-25 DIAGNOSIS — G8929 Other chronic pain: Secondary | ICD-10-CM

## 2021-03-25 DIAGNOSIS — M5137 Other intervertebral disc degeneration, lumbosacral region: Secondary | ICD-10-CM

## 2021-03-25 DIAGNOSIS — G894 Chronic pain syndrome: Secondary | ICD-10-CM

## 2021-03-25 DIAGNOSIS — Z451 Encounter for adjustment and management of infusion pump: Secondary | ICD-10-CM

## 2021-03-25 MED ORDER — PAIN MANAGEMENT IT PUMP REFILL: 1.0000 | Freq: Once | INTRATHECAL | 0 refills | Status: AC

## 2021-04-17 NOTE — Progress Notes (Signed)
PROVIDER NOTE: Interpretation of information contained herein should be left to medically-trained personnel. Specific patient instructions are provided elsewhere under "Patient Instructions" section of medical record. This document was created in part using STT-dictation technology, any transcriptional errors that may result from this process are unintentional.  Patient: Brian Spencer Type: Established DOB: 1950/09/06 MRN: [id number] [facility]: [facility]  Service: Procedure DOS: 04/18/2021 Setting: Ambulatory Location: Ambulatory outpatient facility Delivery: Face-to-face Provider: Gaspar Cola, MD Specialty: Interventional Pain Management Specialty designation: 09 Location: Outpatient facility Ref. Prov.: [place]    Primary Reason for Visit: Interventional Pain Management Treatment. CC: Back Pain (low)  Procedure:          Type: Management of Intrathecal Drug Delivery System (IDDS) - Reservoir Refill [phone]). No rate change.  Indications: 1. Chronic pain syndrome   2. Failed back surgical syndrome   3. Chronic low back pain (1ry area of Pain) (Bilateral) (R>L)   4. Chronic lower extremity pain (2ry area of Pain) (Right)   5. DDD (degenerative disc disease), lumbosacral   6. Fusion of lumbar spine   7. Presence of intrathecal pump   8. Encounter for adjustment or management of infusion pump   9. Encounter for therapeutic procedure   10. Pharmacologic therapy   11. Encounter for medication management   12. Encounter for chronic pain management    Pain Assessment: Self-Reported Pain Score: 3 /10             Reported level is compatible with observation.         Today the patient has requested that on his next refill, we decrease his opioid concentration even further.  He denies any side effects or adverse reactions to his current medication or dose.  During the procedure of accessing the pump, the patient did have a spasm of his lower back, one related to the  procedure that we were performing, but because he continues to have those, we will keep the baclofen on the pump, as is.  On the next refill we will lower the concentration of the hydromorphone from 2.5 mg/mL to 1.25 mg/mL in addition, we will switch to primary medication on the pump to be that of the bupivacaine.  Intrathecal Drug Delivery System (IDDS)  Pump Device:  Manufacturer: Medtronic Model: Synchromed II Model No.: [id number] Serial No.: [id number] H Delivery Route: Intrathecal Type: Programmable  Volume (mL): 40 mL reservoir Priming Volume: 0.207 mL  Calibration Constant: 117.0  MRI compatibility: Conditional   Implant Details:  Date: 10/08/20 (Third Pump) Implanter: Dr. Deetta Perla (Last)  Contact Information: [facility], [place], [place] Last Revision/Replacement: 10/08/2020 Estimated Replacement Date: May 2029  Implant Site: Abdominal Laterality: Right  Catheter: Manufacturer: Medtronic Model:  n/a  Model No.: 8731  Serial No.: n/a  Implanted Length (cm): 94.1  Catheter Volume (mL): 0.207  Tip Location (Level): T10 Canal Access Site: L2-3  Drug content:  Primary Medication Class: Opioid  Medication: PF-Hydromorphone (Dilaudid)  Concentration: 2.5 mg/mL  Secondary Medication Class: Non-opioid analgesic  Medication: PF-Clonidine  Concentration: 50.0 mcg/mL  Tertiary Medication Class: Local Anesthetic  Medication: PF-Bupivacaine  Concentration: 10.0 mg/mL  Fourth Medication Class: none  Medication: n/a  Concentration: n/a  PTC/PCA parameters:  Mode: Off/Inactive  Programming:  Type: Simple continuous.  Medication, Concentration, Infusion Program, & Delivery Rate: For up-to-date details please see most recent scanned programming printout.      Changes:  Medication Change: None at this point Rate Change: No change in rate  Reported side-effects  or adverse reactions: None reported  Effectiveness: Described as relatively effective,  allowing for increase in activities of daily living (ADL) Clinically meaningful improvement in function (CMIF): Sustained CMIF goals met  Plan: Pump refill today   Pharmacotherapy Assessment   Opioid Analgesic: Intrathecal Hydromorphone (Dilaudid) @ 2.399 mg/d. MME: 9.6 mg/day.   Monitoring: [place] PMP: PDMP not reviewed this encounter.       Pharmacotherapy: No side-effects or adverse reactions reported. Compliance: No problems identified. Effectiveness: Clinically acceptable. Plan: Refer to "POC". UDS:  Summary  Date Value Ref Range Status  11/12/2015 FINAL  Final    Comment:    ==================================================================== TOX[facility]RE COMP DRUG ANALYSIS,UR ==================================================================== Test                             Result       Flag       Units Drug Present and Declared for Prescription Verification   7-aminoclonazepam              82           EXPECTED   ng/mg creat    7-aminoclonazepam is an expected metabolite of clonazepam. Source    of clonazepam is a scheduled prescription medication.   Trazodone                      PRESENT      EXPECTED   1,3 chlorophenyl piperazine    PRESENT      EXPECTED    1,3-chlorophenyl piperazine is an expected metabolite of    trazodone.   Verapamil                      PRESENT      EXPECTED Drug Present not Declared for Prescription Verification   Hydromorphone                  1656         UNEXPECTED ng/mg creat    Hydromorphone may be administered as a scheduled prescription    medication; it is also an expected metabolite of hydrocodone. Drug Absent but Declared for Prescription Verification   Venlafaxine                    Not Detected UNEXPECTED ==================================================================== Test                      Result    Flag   Units      Ref Range   Creatinine              73               mg/dL       >=20 ==================================================================== Declared Medications:  The flagging and interpretation on this report are based on the  following declared medications.  Unexpected results may arise from  inaccuracies in the declared medications.  **Note: The testing scope of this panel includes these medications:  Clonazepam (Klonopin)  Trazodone (Desyrel)  Venlafaxine (Effexor)  Verapamil (Calan)  **Note: The testing scope of this panel does not include following  reported medications:  Albuterol  Budenoside (Symbicort)  Chlorthalidone  Formoterol (Symbicort)  Multivitamin  Tamsulosin (Flomax)  Tiotropium (Spiriva)  Ubiquinone (Coenzyme Q 10)  Vitamin D2 (Ergocalciferol) ==================================================================== For clinical consultation, please call [phone]. ====================================================================      Pre-op H&P Assessment:  Mr. Seminara is a 71 y.o. (  year old), male patient, seen today for interventional treatment. 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 Mr. Durio has a current medication list  which includes the following prescription(s): acetaminophen, albuterol, albuterol, amlodipine, atorvastatin, clonazepam, neuriva plus, coq10, furosemide, magnesium oxide, multivitamin, NON FORMULARY, oxygen-helium, [START ON 05/10/2021] PAIN MANAGEMENT IT PUMP REFILL, pantoprazole, polyvinyl alcohol, propranolol, tamsulosin, tiotropium bromide-olodaterol, trazodone, and venlafaxine xr. His primarily concern today is the Back Pain (low)  Initial Vital Signs:  Pulse/HCG Rate: 80  Temp: 98.6 F (37 C) Resp: 18 BP: 138/77 SpO2: 96 %  BMI: Estimated body mass index is 41.6 kg/m as calculated from the following:   Height as of this encounter: 5' 5"  (1.651 m).   Weight as of this encounter: 250 lb (113.4 kg).  Risk Assessment: Allergies: Reviewed. He is allergic to baclofen and hydralazine.  Allergy Precautions: None required Coagulopathies: Reviewed. None identified.  Blood-thinner therapy: None at this time Active Infection(s): Reviewed. None identified. Mr. Pellerito is afebrile  Site Confirmation: Mr. Yardley was asked to confirm the procedure and laterality before marking the site Procedure checklist: Completed Consent: Before the procedure and under the influence of no sedative(s), amnesic(s), or anxiolytics, the patient was informed of the treatment options, risks and possible complications. To fulfill our ethical and legal obligations, as recommended by the American Medical Association's Code of Ethics, I have informed the patient of my clinical impression; the nature and purpose of the treatment or procedure; the risks, benefits, and possible complications of the intervention; the alternatives, including doing nothing; the risk(s) and benefit(s) of the alternative treatment(s) or procedure(s); and the risk(s) and benefit(s) of doing nothing.  Mr. Allende was provided with information about the general risks and possible complications associated with most interventional procedures. These include,  but are not limited to: failure to achieve desired goals, infection, bleeding, organ or nerve damage, allergic reactions, paralysis, and/or death.  In addition, he was informed of those risks and possible complications associated to this particular procedure, which include, but are not limited to: damage to the implant; failure to decrease pain; local, systemic, or serious CNS infections, intraspinal abscess with possible cord compression and paralysis, or life-threatening such as meningitis; bleeding; organ damage; nerve injury or damage with subsequent sensory, motor, and/or autonomic system dysfunction, resulting in transient or permanent pain, numbness, and/or weakness of one or several areas of the body; allergic reactions, either minor or major life-threatening, such as anaphylactic or anaphylactoid reactions.  Furthermore, Mr. Dube was informed of those risks and complications associated with the medications. These include, but are not limited to: allergic reactions (i.e.: anaphylactic or anaphylactoid reactions); endorphine suppression; bradycardia and/or hypotension; water retention and/or peripheral vascular relaxation leading to lower extremity edema and possible stasis ulcers; respiratory depression and/or shortness of breath; decreased metabolic rate leading to weight gain; swelling or edema; medication-induced neural toxicity; particulate matter embolism and blood vessel occlusion with resultant organ, and/or nervous  system infarction; and/or intrathecal granuloma formation with possible spinal cord compression and permanent paralysis.  Before refilling the pump Mr. Arizpe was informed that some of the medications used in the devise may not be FDA approved for such use and therefore it constitutes an off-label use of the medications.  Finally, he was informed that Medicine is not an exact science; therefore, there is also the possibility of unforeseen or unpredictable risks and/or possible  complications that may result in a catastrophic outcome. The patient indicated having understood very clearly. We have given the patient no guarantees and we have made no promises. Enough time was given to the patient to ask questions, all of which were answered to the patient's satisfaction. Mr. Kissick has indicated that he wanted to continue with the procedure. Attestation: I, the ordering provider, attest that I have discussed with the patient the benefits, risks, side-effects, alternatives, likelihood of achieving goals, and potential problems during recovery for the procedure that I have provided informed consent. Date   Time: 04/18/2021 12:40 PM  Pre-Procedure Preparation:  Monitoring: As per clinic protocol. Respiration, ETCO2, SpO2, BP, heart rate and rhythm monitor placed and checked for adequate function Safety Precautions: Patient was assessed for positional comfort and pressure points before starting the procedure. Time-out: I initiated and conducted the "Time-out" before starting the procedure, as per protocol. The patient was asked to participate by confirming the accuracy of the "Time Out" information. Verification of the correct person, site, and procedure were performed and confirmed by me, the nursing staff, and the patient. "Time-out" conducted as per Joint Commission's Universal Protocol (UP.01.01.01). Time: 1250  Description of Procedure:          Position: Supine Target Area: Central-port of intrathecal pump. Approach: Anterior, 90 degree angle approach. Area Prepped: Entire Area around the pump implant. DuraPrep (Iodine Povacrylex [0.7% available iodine] and Isopropyl Alcohol, 74% w/w) Safety Precautions: Aspiration looking for blood return was conducted prior to all injections. At no point did we inject any substances, as a needle was being advanced. No attempts were made at seeking any paresthesias. Safe injection practices and needle disposal techniques used. Medications  properly checked for expiration dates. SDV (single dose vial) medications used. Description of the Procedure: Protocol guidelines were followed. Two nurses trained to do implant refills were present during the entire procedure. The refill medication was checked by both healthcare providers as well as the patient. The patient was included in the "Time-out" to verify the medication. The patient was placed in position. The pump was identified. The area was prepped in the usual manner. The sterile template was positioned over the pump, making sure the side-port location matched that of the pump. Both, the pump and the template were held for stability. The needle provided in the Medtronic Kit was then introduced thru the center of the template and into the central port. The pump content was aspirated and discarded volume documented. The new medication was slowly infused into the pump, thru the filter, making sure to avoid overpressure of the device. The needle was then removed and the area cleansed, making sure to leave some of the prepping solution back to take advantage of its long term bactericidal properties. The pump was interrogated and programmed to reflect the correct medication, volume, and dosage. The program was printed and taken to the physician for approval. Once checked and signed by the physician, a copy was provided to the patient and another scanned into the EMR.  Vitals:   04/18/21 1238  BP: 138/77  Pulse: 80  Resp: 18  Temp: 98.6 F (37 C)  SpO2: 96%  Weight: 250 lb (113.4 kg)  Height: 5' 5"  (1.651 m)    Start Time: 1250 hrs. End Time:   hrs. Materials & Medications: Medtronic Refill Kit Medication(s): Please see chart orders for details.  Imaging Guidance:          Type of Imaging Technique: None used Indication(s): N/A Exposure Time: No patient exposure Contrast: None used. Fluoroscopic Guidance: N/A Ultrasound Guidance: N/A Interpretation: N/A  Antibiotic Prophylaxis:    Anti-infectives (From admission, onward)    None      Indication(s): None identified  Post-operative Assessment:  Post-procedure Vital Signs:  Pulse/HCG Rate: 80  Temp: 98.6 F (37 C) Resp: 18 BP: 138/77 SpO2: 96 %  EBL: None  Complications: No immediate post-treatment complications observed by team, or reported by patient.  Note: The patient tolerated the entire procedure well. A repeat set of vitals were taken after the procedure and the patient was kept under observation following institutional policy, for this type of procedure. Post-procedural neurological assessment was performed, showing return to baseline, prior to discharge. The patient was provided with post-procedure discharge instructions, including a section on how to identify potential problems. Should any problems arise concerning this procedure, the patient was given instructions to immediately contact us, at any time, without hesitation. In any case, we plan to contact the patient by telephone for a follow-up status report regarding this interventional procedure.  Comments:  No additional relevant information.  Plan of Care  Orders:  Orders Placed This Encounter  Procedures   PUMP REFILL    Maintain Protocol by having two(2) healthcare providers during procedure and programming.    Scheduling Instructions:     Please refill intrathecal pump today.    Order Specific Question:   Where will this procedure be performed?    Answer:   [facility]   PUMP REFILL    Whenever possible schedule on a procedure today.    Standing Status:   Future    Standing Expiration Date:   08/18/2021    Scheduling Instructions:     Please schedule intrathecal pump refill based on pump programming. Avoid schedule intervals of more than 120 days (4 months).    Order Specific Question:   Where will this procedure be performed?    Answer:   [facility]   Informed Consent Details: Physician/Practitioner Attestation;  Transcribe to consent form and obtain patient signature    Transcribe to consent form and obtain patient signature.    Order Specific Question:   Physician/Practitioner attestation of informed consent for procedure/surgical case    Answer:   I, the physician/practitioner, attest that I have discussed with the patient the benefits, risks, side effects, alternatives, likelihood of achieving goals and potential problems during recovery for the procedure that I have provided informed consent.    Order Specific Question:   Procedure    Answer:   Intrathecal pump refill    Order Specific Question:   Physician/Practitioner performing the procedure    Answer:   Attending Physician: Kathlen Brunswick. Dossie Arbour, MD & designated trained staff    Order Specific Question:   Indication/Reason    Answer:   Chronic Pain Syndrome (G89.4), presence of an intrathecal pump (Z97.8)   Chronic Opioid Analgesic:  Intrathecal Hydromorphone (Dilaudid) @ 2.399 mg/d. MME: 9.6 mg/day.   Medications ordered for procedure: No orders of the defined types were placed in this encounter.  Medications administered: Uriyah Raska had no medications administered during this visit.  See the medical record for exact dosing, route, and time of administration.  Follow-up plan:   Return for Pump Refill (Max:75mo.       Interventional Therapies  Risk   Complexity Considerations:   Estimated body mass index is 40.77 kg/m as calculated from the following:   Height as of this encounter: 5' 5"  (1.651 m).   Weight as of this encounter: 245 lb (111.1 kg). NOTE: ELIQUIS ANTICOAGULATION (Stop: 3days   Re-start: 6 hrs)   Planned   Pending:   (02/14/2021) the plan is to continue slowly decreasing the amount of opioids that he is getting through the pump and trying to control the pain with the local anesthetic and baclofen that is currently running through it.  On the patient's next refill we will lower the hydromorphone concentration from 2.5  mg/mL to 1.25 mg/mL.  In addition, we will change the primary medication in the pump to be the local anesthetic.   Under consideration:   Continue management of intrathecal pump  Diagnostic bilateral lumbar facet blocks #1    Completed:   None at this time   Therapeutic   Palliative (PRN) options:   Continue management of intrathecal pump      Recent Visits Date Type Provider Dept  02/14/21 Procedure visit NMilinda Pointer MD [facility]  Showing recent visits within past 90 days and meeting all other requirements Today's Visits Date Type Provider Dept  04/18/21 Procedure visit NMilinda Pointer MD [facility]  Showing today's visits and meeting all other requirements Future Appointments Date Type Provider Dept  06/27/21 Appointment NMilinda Pointer MD [facility]  Showing future appointments within next 90 days and meeting all other requirements  Disposition: Discharge home  Discharge (Date   Time): 04/18/2021; 1315 hrs.   Primary Care Physician: [facility] Note by: FGaspar Cola MD Date: 04/18/2021; Time: 1:27 PM  Disclaimer:  Medicine is not an eChief Strategy Officer The only guarantee in medicine is that nothing is guaranteed. It is important to note that the decision to proceed with this intervention was based on the information collected from the patient. The Data and conclusions were drawn from the patient's questionnaire, the interview, and the physical examination. Because the information was provided in large part by the patient, it cannot be guaranteed that it has not been purposely or unconsciously manipulated. Every effort has been made to obtain as much relevant data as possible for this evaluation. It is important to note that the conclusions that lead to this procedure are derived in large part from the available data. Always take into account that the treatment will also be  dependent on availability of resources and existing treatment guidelines, considered by other Pain Management Practitioners as being common knowledge and practice, at the time of the intervention. For Medico-Legal purposes, it is also important to point out that variation in procedural techniques and pharmacological choices are the acceptable norm. The indications, contraindications, technique, and results of the above procedure should only be interpreted and judged by a Board-Certified Interventional Pain Specialist with extensive familiarity and expertise in the same exact procedure and technique.

## 2021-04-18 ENCOUNTER — Encounter: Payer: Self-pay | Admitting: Pain Medicine

## 2021-04-18 ENCOUNTER — Ambulatory Visit: Payer: Worker's Compensation | Attending: Pain Medicine | Admitting: Pain Medicine

## 2021-04-18 ENCOUNTER — Other Ambulatory Visit: Payer: Self-pay

## 2021-04-18 VITALS — BP 138/77 | HR 80 | Temp 98.6°F | Resp 18 | Ht 65.0 in | Wt 250.0 lb

## 2021-04-18 DIAGNOSIS — M79604 Pain in right leg: Secondary | ICD-10-CM | POA: Insufficient documentation

## 2021-04-18 DIAGNOSIS — M5137 Other intervertebral disc degeneration, lumbosacral region: Secondary | ICD-10-CM | POA: Diagnosis not present

## 2021-04-18 DIAGNOSIS — M4326 Fusion of spine, lumbar region: Secondary | ICD-10-CM | POA: Diagnosis not present

## 2021-04-18 DIAGNOSIS — Z79899 Other long term (current) drug therapy: Secondary | ICD-10-CM | POA: Insufficient documentation

## 2021-04-18 DIAGNOSIS — Z5189 Encounter for other specified aftercare: Secondary | ICD-10-CM | POA: Insufficient documentation

## 2021-04-18 DIAGNOSIS — G894 Chronic pain syndrome: Secondary | ICD-10-CM

## 2021-04-18 DIAGNOSIS — Z978 Presence of other specified devices: Secondary | ICD-10-CM

## 2021-04-18 DIAGNOSIS — Z451 Encounter for adjustment and management of infusion pump: Secondary | ICD-10-CM | POA: Diagnosis not present

## 2021-04-18 DIAGNOSIS — G8929 Other chronic pain: Secondary | ICD-10-CM | POA: Diagnosis present

## 2021-04-18 DIAGNOSIS — M961 Postlaminectomy syndrome, not elsewhere classified: Secondary | ICD-10-CM | POA: Insufficient documentation

## 2021-04-18 DIAGNOSIS — M5441 Lumbago with sciatica, right side: Secondary | ICD-10-CM | POA: Insufficient documentation

## 2021-04-18 NOTE — Patient Instructions (Signed)
Opioid Overdose ?Opioids are drugs that are often used to treat pain. Opioids include illegal drugs, such as heroin, as well as prescription pain medicines, such as codeine, morphine, hydrocodone, and fentanyl. ?An opioid overdose happens when you take too much of an opioid. An overdose may be intentional or accidental and can happen with any type of opioid. ?The effects of an overdose can be mild, dangerous, or even deadly. Opioid overdose is a medical emergency. ?What are the causes? ?This condition may be caused by: ?Taking too much of an opioid on purpose. ?Taking too much of an opioid by accident. ?Using two or more substances that contain opioids at the same time. ?Taking an opioid with a substance that affects your heart, breathing, or blood pressure. These include alcohol, tranquilizers, sleeping pills, illegal drugs, and some over-the-counter medicines. ?This condition may also happen due to an error made by: ?A health care provider who prescribes a medicine. ?The pharmacist who fills the prescription. ?What increases the risk? ?This condition is more likely in: ?Children. They may be attracted to colorful pills. Because of a child's small size, even a small amount of a medicine can be dangerous. ?Older people. They may be taking many different medicines. Older people may have difficulty reading labels or remembering when they last took their medicines. They may also be more sensitive to the effects of opioids. ?People with chronic medical conditions, especially heart, liver, kidney, or neurological diseases. ?People who take an opioid for a long period of time. ?People who take opioids and use illegal drugs, such as heroin, or other substances, such as alcohol. ?People who: ?Have a history of drug or alcohol abuse. ?Have certain mental health conditions. ?Have a history of previous drug overdoses. ?People who take opioids that are not prescribed for them. ?What are the signs or symptoms? ?Symptoms of this  condition depend on the type of opioid and the amount that was taken. Common symptoms include: ?Sleepiness or difficulty waking from sleep. ?Confusion. ?Slurred speech. ?Slowed breathing and a slow pulse (bradycardia). ?Nausea and vomiting. ?Abnormally small pupils. ?Signs and symptoms that require emergency treatment include: ?Cold, clammy, and pale skin. ?Blue lips and fingernails. ?Vomiting. ?Gurgling sounds in the throat. ?A pulse that is very slow or difficult to detect. ?Breathing that is very irregular, slow, noisy, or difficult to detect. ?Inability to respond to speech or be awakened from sleep (stupor). ?Seizures. ?How is this diagnosed? ?This condition is diagnosed based on your symptoms and medical history. It is important to tell your health care provider: ?About all of the opioids that you took. ?When you took the opioids. ?Whether you were drinking alcohol or using marijuana, cocaine, or other drugs. ?Your health care provider will do a physical exam. This exam may include: ?Checking and monitoring your heart rate and rhythm, breathing rate, temperature, and blood pressure. ?Measuring oxygen levels in your blood. ?Checking for abnormally small pupils. ?You may also have blood tests or urine tests. You may have X-rays if you are having severe breathing problems. ?How is this treated? ?This condition requires immediate medical treatment and hospitalization. Reversing the effects of the opioid is the first step in treatment. If you have a Narcan kit or naloxone, use it right away. Follow your health care provider's instructions. A friend or family member can also help you with this. ?The rest of your treatment will be given in the hospital intensive care (ICU). Treatment in the hospital may include: ?Giving salts and minerals (electrolytes) along with fluids through  an IV. ?Inserting a breathing tube (endotracheal tube) in your airway to help you breathe if you cannot breathe on your own or you are in  danger of not being able to breathe on your own. ?Giving oxygen through a small tube under your nose. ?Passing a tube through your nose and into your stomach (nasogastric tube, or NG tube) to empty your stomach. ?Giving medicines that: ?Increase your blood pressure. ?Relieve nausea and vomiting. ?Relieve abdominal pain and cramping. ?Reverse the effects of the opioid (naloxone). ?Monitoring your heart and oxygen levels. ?Ongoing counseling and mental health support if you intentionally overdosed or used an illegal drug. ?Follow these instructions at home: ?Medicines ?Take over-the-counter and prescription medicines only as told by your health care provider. ?Always ask your health care provider about possible side effects and interactions of any new medicine that you start taking. ?Keep a list of all the medicines that you take, including over-the-counter medicines. Bring this list with you to all your medical visits. ?General instructions ?Drink enough fluid to keep your urine pale yellow. ?Keep all follow-up visits. This is important. ?How is this prevented? ?Read the drug inserts that come with your opioid pain medicines. ?Take medicines only as told by your health care provider. Do not take more medicine than you are told. Do not take medicines more frequently than you are told. ?Do not drink alcohol or take sedatives when taking opioids. ?Do not use illegal or recreational drugs, including cocaine, ecstasy, and marijuana. ?Do not take opioid medicines that are not prescribed for you. ?Store all medicines in safety containers that are out of the reach of children. ?Get help if you are struggling with: ?Alcohol or drug use. ?Depression or another mental health problem. ?Thoughts of hurting yourself or another person. ?Keep the phone number of your local poison control center near your phone or in your mobile phone. In the [place] the hotline of the [facility] is (223)125-5573. ?If you were  prescribed naloxone, make sure you understand how to take it. ?Contact a health care provider if: ?You need help understanding how to take your pain medicines. ?You feel your medicines are too strong. ?You are concerned that your pain medicines are not working well for your pain. ?You develop new symptoms or side effects when you are taking medicines. ?Get help right away if: ?You or someone else is having symptoms of an opioid overdose. Get help even if you are not sure. ?You have thoughts about hurting yourself or others. ?You have: ?Chest pain. ?Difficulty breathing. ?A loss of consciousness. ?These symptoms may represent a serious problem that is an emergency. Do not wait to see if the symptoms will go away. Get medical help right away. Call your local emergency services (911 in the [place] Do not drive yourself to the hospital. ?If you ever feel like you may hurt yourself or others, or have thoughts about taking your own life, get help right away. You can go to your nearest emergency department or: ?Call your local emergency services (911 in the [place] ?Call a suicide crisis helpline, such as the [place] at 445 072 7750 or 988 in the [place]. This is open 24 hours a day in the [place] ?Text the Crisis Text Line at 8151747025 (in the [place].). ?Summary ?Opioids are drugs that are often used to treat pain. Opioids include illegal drugs, such as heroin, as well as prescription pain medicines. ?An opioid overdose happens when you take too much of an opioid. ?  Overdoses can be intentional or accidental. ?Opioid overdose is very dangerous. It is a life-threatening emergency. ?If you or someone you know is experiencing an opioid overdose, get help right away. ?This information is not intended to replace advice given to you by your health care provider. Make sure you discuss any questions you have with your health care provider. ?Document Revised: 08/29/2020 Document Reviewed: 05/16/2020 ?Elsevier  Patient Education ? [place]. ? ?

## 2021-04-18 NOTE — Progress Notes (Signed)
Safety precautions to be maintained throughout the outpatient stay will include: orient to surroundings, keep bed in low position, maintain call bell within reach at all times, provide assistance with transfer out of bed and ambulation.  

## 2021-04-19 ENCOUNTER — Telehealth: Payer: Self-pay

## 2021-04-19 MED FILL — Medication: INTRATHECAL | Qty: 1 | Status: AC

## 2021-04-19 NOTE — Telephone Encounter (Signed)
Post IT pump refill follow up.  Patient states he is doing good.  ?

## 2021-05-29 ENCOUNTER — Other Ambulatory Visit: Payer: Self-pay

## 2021-05-29 MED ORDER — PAIN MANAGEMENT IT PUMP REFILL: 1.0000 | Freq: Once | INTRATHECAL | 0 refills | Status: AC

## 2021-06-23 NOTE — Progress Notes (Signed)
PROVIDER NOTE: Interpretation of information contained herein should be left to medically-trained personnel. Specific patient instructions are provided elsewhere under "Patient Instructions" section of medical record. This document was created in part using STT-dictation technology, any transcriptional errors that may result from this process are unintentional.  ?Patient: Brian Spencer ?Type: Established ?DOB: 06/18/1950 ?MRN: [id number] ?PCP: [facility]  Service: Procedure ?DOS: 06/27/2021 ?Setting: Ambulatory ?Location: Ambulatory outpatient facility ?Delivery: Face-to-face Provider: Francisco A Naveira, MD ?Specialty: Interventional Pain Management ?Specialty designation: 09 ?Location: Outpatient facility ?Ref. Prov.: [facility]   ? ?Primary Reason for Visit: Interventional Pain Management Treatment. ?CC: Back Pain ? ?Procedure:          ?Type: Management of Intrathecal Drug Delivery System (IDDS) - Reservoir Refill (95990) + Analysis & Programming (62368).  Today we have changed the drug concentrations and the infusion rate.  Please see below for details.   ?Indications: ?1. Chronic pain syndrome   ?2. Failed back surgical syndrome   ?3. Chronic low back pain (1ry area of Pain) (Bilateral) (R>L)   ?4. Chronic lower extremity pain (2ry area of Pain) (Right)   ?5. DDD (degenerative disc disease), lumbosacral   ?6. Fusion of lumbar spine   ?7. Presence of intrathecal pump   ?8. Encounter for adjustment or management of infusion pump   ?9. Encounter for therapeutic procedure   ?10. Pharmacologic therapy   ?11. Encounter for medication management   ? ?Pain Assessment: ?Self-Reported Pain Score: 3 /10             ?Reported level is compatible with observation.       ?  ?Today the patient has requested that on his next refill, we decrease his opioid concentration even further.  He denies any side effects or adverse reactions to his current medication or dose.  During the procedure of accessing the pump, the  patient did have a spasm of his lower back, one related to the procedure that we were performing, but because he continues to have those, we will keep the baclofen on the pump, as is.  On the next refill we will lower the concentration of the hydromorphone from 2.5 mg/mL to 1.25 mg/mL in addition, we will switch to primary medication on the pump to be that of the bupivacaine. ? ?Intrathecal Drug Delivery System (IDDS)  ?Pump Device:  ?Manufacturer: Medtronic ?Model: Synchromed II ?Model No.: 8637-40 ?Serial No.: [id number] ?Delivery Route: Intrathecal ?Type: Programmable  ?Volume (mL): 40 mL reservoir ?Priming Volume: 0.207 mL  ?Calibration Constant: 117.0  ?MRI compatibility: Conditional  ? ?Implant Details:  ?Date: 10/08/20 (Third Pump) ?Implanter: Dr. Steven Cook (Last)  ?Contact Information: [facility], [place], [place] ?Last Revision/Replacement: 10/08/2020 ?Estimated Replacement Date: May 2029  ?Implant Site: Abdominal ?Laterality: Right ? ?Catheter: ?Manufacturer: Medtronic ?Model:  n/a  ?Model No.: 8731  ?Serial No.: n/a  ?Implanted Length (cm): 94.1  ?Catheter Volume (mL): 0.207  ?Tip Location (Level): T10 ?Canal Access Site: L2-3 ? ?Old Drug content:  ?Primary Medication Class: Opioid  ?Medication: PF-Hydromorphone (Dilaudid)  ?Concentration: 2.5 mg/mL change to 1.0 mg/mL ? ?Secondary Medication Class: Non-opioid analgesic  ?Medication: PF-Clonidine  ?Concentration: 50.0 mcg/mL change to 25 mcg/mL ? ?Tertiary Medication Class: Local Anesthetic (switched to Primary medication) (06/27/2021) ?Medication: PF-Bupivacaine  ?Concentration: 10.0 mg/mL stay same ? ?Fourth Medication Class: none  ?Medication: n/a  ?Concentration: n/a ? ?New Drug content:  ?Primary Medication Class: Local Anesthetic ?Medication: PF-Bupivacaine  ?Concentration: 10.0 mg/mL  ? ?Secondary Medication Class: Non-opioid analgesic  ?Medication:   PF-Clonidine  ?Concentration: 25 mcg/mL ? ?Tertiary Medication Class: Opioid   ?Medication: PF-Hydromorphone (Dilaudid)  ?Concentration: 1.0 mg/mL ? ?Fourth Medication Class: none  ?Medication: n/a  ?Concentration: n/a ? ?PTC/PCA parameters:  ?Mode: Off/Inactive ? ?Programming:  ?Type: Simple continuous.  ?Medication, Concentration, Infusion Program, & Delivery Rate: For up-to-date details please see most recent scanned programming printout.   ? ?  ?Changes:  ?Medication Change:  Today we have decreased the concentration of the hydromorphone from 2.5 mg/mL to 1.0 mg/mL.  In addition to that, we have also changed the concentration of the clonidine from 50 mcg/mL to 25 mcg/mL.  Finally, the primary medication has been changed from the hydromorphone to the bupivacaine. ?Rate Change:  The rate of the bupivacaine has been changed from ?5 mg/day to 6 mg/day.  This change has decreased the amount of hydromorphone that the patient receives to approximately half of what he had before.  The same will be true with the clonidine. ? ?Reported side-effects or adverse reactions: None reported ? ?Effectiveness: Described as relatively effective, allowing for increase in activities of daily living (ADL) ?Clinically meaningful improvement in function (CMIF): Sustained CMIF goals met ? ?Plan: Pump refill today ?  ?Pharmacotherapy Assessment  ? ?Opioid Analgesic: Intrathecal Hydromorphone (Dilaudid) @ 2.399 mg/d. ?MME: 9.6 mg/day.  ? ?Monitoring: ? PMP: PDMP reviewed during this encounter.       ?Pharmacotherapy: No side-effects or adverse reactions reported. ?Compliance: No problems identified. ?Effectiveness: Clinically acceptable. ?Plan: Refer to "POC". UDS:  ?Summary  ?Date Value Ref Range Status  ?11/12/2015 FINAL  Final  ?  Comment:  ?  ==================================================================== ?[facility] COMP DRUG ANALYSIS,UR ?==================================================================== ?Test                             Result       Flag       Units ?Drug Present and Declared for  Prescription Verification ?  7-aminoclonazepam              82           EXPECTED   ng/mg creat ?   7-aminoclonazepam is an expected metabolite of clonazepam. Source ?   of clonazepam is a scheduled prescription medication. ?  Trazodone                      PRESENT      EXPECTED ?  1,3 chlorophenyl piperazine    PRESENT      EXPECTED ?   1,3-chlorophenyl piperazine is an expected metabolite of ?   trazodone. ?  Verapamil                      PRESENT      EXPECTED ?Drug Present not Declared for Prescription Verification ?  Hydromorphone                  1656         UNEXPECTED ng/mg creat ?   Hydromorphone may be administered as a scheduled prescription ?   medication; it is also an expected metabolite of hydrocodone. ?Drug Absent but Declared for Prescription Verification ?  Venlafaxine                    Not Detected UNEXPECTED ?==================================================================== ?Test                      Result    Flag  Units      Ref Range ?  Creatinine              73               mg/dL      >=20 ?==================================================================== ?Declared Medications: ? The flagging and interpretation on this report are based on the ? following declared medications.  Unexpected results may arise from ? inaccuracies in the declared medications. ? **Note: The testing scope of this panel includes these medications: ? Clonazepam (Klonopin) ? Trazodone (Desyrel) ? Venlafaxine (Effexor) ? Verapamil (Calan) ? **Note: The testing scope of this panel does not include following ? reported medications: ? Albuterol ? Budenoside (Symbicort) ? Chlorthalidone ? Formoterol (Symbicort) ? Multivitamin ? Tamsulosin (Flomax) ? Tiotropium (Spiriva) ? Ubiquinone (Coenzyme Q 10) ? Vitamin D2 (Ergocalciferol) ?==================================================================== ?For clinical consultation, please call [phone]  [phone]. ?==================================================================== ?  ?  ? ?Pre-op H&P Assessment:  ?Mr. Holtrop is a 70 y.o. (year old), male patient, seen today for interventional treatment. He  has a past surgical history that includes Laminectomy (10/07/2000); Lumbar fusi

## 2021-06-27 ENCOUNTER — Ambulatory Visit: Payer: Worker's Compensation | Attending: Pain Medicine | Admitting: Pain Medicine

## 2021-06-27 ENCOUNTER — Encounter: Payer: Self-pay | Admitting: Pain Medicine

## 2021-06-27 VITALS — BP 126/65 | HR 71 | Temp 98.1°F | Resp 16 | Ht 65.0 in | Wt 244.0 lb

## 2021-06-27 DIAGNOSIS — Z79899 Other long term (current) drug therapy: Secondary | ICD-10-CM | POA: Diagnosis present

## 2021-06-27 DIAGNOSIS — Z451 Encounter for adjustment and management of infusion pump: Secondary | ICD-10-CM | POA: Diagnosis present

## 2021-06-27 DIAGNOSIS — M79604 Pain in right leg: Secondary | ICD-10-CM | POA: Insufficient documentation

## 2021-06-27 DIAGNOSIS — M5137 Other intervertebral disc degeneration, lumbosacral region: Secondary | ICD-10-CM | POA: Diagnosis present

## 2021-06-27 DIAGNOSIS — Z5189 Encounter for other specified aftercare: Secondary | ICD-10-CM | POA: Diagnosis present

## 2021-06-27 DIAGNOSIS — M5441 Lumbago with sciatica, right side: Secondary | ICD-10-CM | POA: Diagnosis present

## 2021-06-27 DIAGNOSIS — G894 Chronic pain syndrome: Secondary | ICD-10-CM | POA: Insufficient documentation

## 2021-06-27 DIAGNOSIS — Z978 Presence of other specified devices: Secondary | ICD-10-CM | POA: Insufficient documentation

## 2021-06-27 DIAGNOSIS — M961 Postlaminectomy syndrome, not elsewhere classified: Secondary | ICD-10-CM | POA: Diagnosis present

## 2021-06-27 DIAGNOSIS — G8929 Other chronic pain: Secondary | ICD-10-CM | POA: Insufficient documentation

## 2021-06-27 DIAGNOSIS — M4326 Fusion of spine, lumbar region: Secondary | ICD-10-CM | POA: Diagnosis present

## 2021-06-27 MED FILL — Medication: INTRATHECAL | Qty: 1 | Status: AC

## 2021-06-27 NOTE — Patient Instructions (Signed)
Opioid Overdose ?Opioids are drugs that are often used to treat pain. Opioids include illegal drugs, such as heroin, as well as prescription pain medicines, such as codeine, morphine, hydrocodone, and fentanyl. ?An opioid overdose happens when you take too much of an opioid. An overdose may be intentional or accidental and can happen with any type of opioid. ?The effects of an overdose can be mild, dangerous, or even deadly. Opioid overdose is a medical emergency. ?What are the causes? ?This condition may be caused by: ?Taking too much of an opioid on purpose. ?Taking too much of an opioid by accident. ?Using two or more substances that contain opioids at the same time. ?Taking an opioid with a substance that affects your heart, breathing, or blood pressure. These include alcohol, tranquilizers, sleeping pills, illegal drugs, and some over-the-counter medicines. ?This condition may also happen due to an error made by: ?A health care provider who prescribes a medicine. ?The pharmacist who fills the prescription. ?What increases the risk? ?This condition is more likely in: ?Children. They may be attracted to colorful pills. Because of a child's small size, even a small amount of a medicine can be dangerous. ?Older people. They may be taking many different medicines. Older people may have difficulty reading labels or remembering when they last took their medicines. They may also be more sensitive to the effects of opioids. ?People with chronic medical conditions, especially heart, liver, kidney, or neurological diseases. ?People who take an opioid for a long period of time. ?People who take opioids and use illegal drugs, such as heroin, or other substances, such as alcohol. ?People who: ?Have a history of drug or alcohol abuse. ?Have certain mental health conditions. ?Have a history of previous drug overdoses. ?People who take opioids that are not prescribed for them. ?What are the signs or symptoms? ?Symptoms of this  condition depend on the type of opioid and the amount that was taken. Common symptoms include: ?Sleepiness or difficulty waking from sleep. ?Confusion. ?Slurred speech. ?Slowed breathing and a slow pulse (bradycardia). ?Nausea and vomiting. ?Abnormally small pupils. ?Signs and symptoms that require emergency treatment include: ?Cold, clammy, and pale skin. ?Blue lips and fingernails. ?Vomiting. ?Gurgling sounds in the throat. ?A pulse that is very slow or difficult to detect. ?Breathing that is very irregular, slow, noisy, or difficult to detect. ?Inability to respond to speech or be awakened from sleep (stupor). ?Seizures. ?How is this diagnosed? ?This condition is diagnosed based on your symptoms and medical history. It is important to tell your health care provider: ?About all of the opioids that you took. ?When you took the opioids. ?Whether you were drinking alcohol or using marijuana, cocaine, or other drugs. ?Your health care provider will do a physical exam. This exam may include: ?Checking and monitoring your heart rate and rhythm, breathing rate, temperature, and blood pressure. ?Measuring oxygen levels in your blood. ?Checking for abnormally small pupils. ?You may also have blood tests or urine tests. You may have X-rays if you are having severe breathing problems. ?How is this treated? ?This condition requires immediate medical treatment and hospitalization. Reversing the effects of the opioid is the first step in treatment. If you have a Narcan kit or naloxone, use it right away. Follow your health care provider's instructions. A friend or family member can also help you with this. ?The rest of your treatment will be given in the hospital intensive care (ICU). Treatment in the hospital may include: ?Giving salts and minerals (electrolytes) along with fluids through  an IV. Inserting a breathing tube (endotracheal tube) in your airway to help you breathe if you cannot breathe on your own or you are in  danger of not being able to breathe on your own. Giving oxygen through a small tube under your nose. Passing a tube through your nose and into your stomach (nasogastric tube, or NG tube) to empty your stomach. Giving medicines that: Increase your blood pressure. Relieve nausea and vomiting. Relieve abdominal pain and cramping. Reverse the effects of the opioid (naloxone). Monitoring your heart and oxygen levels. Ongoing counseling and mental health support if you intentionally overdosed or used an illegal drug. Follow these instructions at home:  Medicines Take over-the-counter and prescription medicines only as told by your health care provider. Always ask your health care provider about possible side effects and interactions of any new medicine that you start taking. Keep a list of all the medicines that you take, including over-the-counter medicines. Bring this list with you to all your medical visits. General instructions Drink enough fluid to keep your urine pale yellow. Keep all follow-up visits. This is important. How is this prevented? Read the drug inserts that come with your opioid pain medicines. Take medicines only as told by your health care provider. Do not take more medicine than you are told. Do not take medicines more frequently than you are told. Do not drink alcohol or take sedatives when taking opioids. Do not use illegal or recreational drugs, including cocaine, ecstasy, and marijuana. Do not take opioid medicines that are not prescribed for you. Store all medicines in safety containers that are out of the reach of children. Get help if you are struggling with: Alcohol or drug use. Depression or another mental health problem. Thoughts of hurting yourself or another person. Keep the phone number of your local poison control center near your phone or in your mobile phone. In the [place] the hotline of the National Poison Control Center is (800) 222-1222. If you were  prescribed naloxone, make sure you understand how to take it. Contact a health care provider if: You need help understanding how to take your pain medicines. You feel your medicines are too strong. You are concerned that your pain medicines are not working well for your pain. You develop new symptoms or side effects when you are taking medicines. Get help right away if: You or someone else is having symptoms of an opioid overdose. Get help even if you are not sure. You have thoughts about hurting yourself or others. You have: Chest pain. Difficulty breathing. A loss of consciousness. These symptoms may represent a serious problem that is an emergency. Do not wait to see if the symptoms will go away. Get medical help right away. Call your local emergency services (911 in the [place] Do not drive yourself to the hospital. If you ever feel like you may hurt yourself or others, or have thoughts about taking your own life, get help right away. You can go to your nearest emergency department or: Call your local emergency services (911 in the [place] Call a suicide crisis helpline, such as the National Suicide Prevention Lifeline at 1-800-273-8255 or 988 in the [place] This is open 24 hours a day in the [place] Text the Crisis Text Line at 741741 (in the [place] Summary Opioids are drugs that are often used to treat pain. Opioids include illegal drugs, such as heroin, as well as prescription pain medicines. An opioid overdose happens when you take too much of an   opioid. ?Overdoses can be intentional or accidental. ?Opioid overdose is very dangerous. It is a life-threatening emergency. ?If you or someone you know is experiencing an opioid overdose, get help right away. ?This information is not intended to replace advice given to you by your health care provider. Make sure you discuss any questions you have with your health care provider. ?Document Revised: 08/29/2020 Document Reviewed: 05/16/2020 ?Elsevier  Patient Education ? [place]. ? ?

## 2021-06-27 NOTE — Progress Notes (Signed)
Safety precautions to be maintained throughout the outpatient stay will include: orient to surroundings, keep bed in low position, maintain call bell within reach at all times, provide assistance with transfer out of bed and ambulation.  

## 2021-06-28 ENCOUNTER — Telehealth: Payer: Self-pay | Admitting: *Deleted

## 2021-06-28 NOTE — Telephone Encounter (Signed)
Post procedure call:   no  questions or concerns.  

## 2021-07-03 ENCOUNTER — Encounter: Payer: Self-pay | Admitting: Pain Medicine

## 2021-07-25 ENCOUNTER — Other Ambulatory Visit: Payer: Self-pay

## 2021-07-25 MED ORDER — PAIN MANAGEMENT IT PUMP REFILL: 1.0000 | Freq: Once | INTRATHECAL | 0 refills | Status: AC

## 2021-08-06 ENCOUNTER — Ambulatory Visit: Payer: Worker's Compensation | Attending: Pain Medicine | Admitting: Pain Medicine

## 2021-08-06 ENCOUNTER — Encounter: Payer: Self-pay | Admitting: Pain Medicine

## 2021-08-06 VITALS — BP 139/72 | HR 85 | Temp 97.9°F | Ht 65.0 in | Wt 244.0 lb

## 2021-08-06 DIAGNOSIS — M5137 Other intervertebral disc degeneration, lumbosacral region: Secondary | ICD-10-CM | POA: Diagnosis not present

## 2021-08-06 DIAGNOSIS — G894 Chronic pain syndrome: Secondary | ICD-10-CM | POA: Insufficient documentation

## 2021-08-06 DIAGNOSIS — M79604 Pain in right leg: Secondary | ICD-10-CM | POA: Insufficient documentation

## 2021-08-06 DIAGNOSIS — Z981 Arthrodesis status: Secondary | ICD-10-CM | POA: Insufficient documentation

## 2021-08-06 DIAGNOSIS — M4326 Fusion of spine, lumbar region: Secondary | ICD-10-CM | POA: Insufficient documentation

## 2021-08-06 DIAGNOSIS — M961 Postlaminectomy syndrome, not elsewhere classified: Secondary | ICD-10-CM | POA: Diagnosis not present

## 2021-08-06 DIAGNOSIS — M5441 Lumbago with sciatica, right side: Secondary | ICD-10-CM | POA: Insufficient documentation

## 2021-08-06 DIAGNOSIS — Z79891 Long term (current) use of opiate analgesic: Secondary | ICD-10-CM | POA: Insufficient documentation

## 2021-08-06 DIAGNOSIS — Z451 Encounter for adjustment and management of infusion pump: Secondary | ICD-10-CM | POA: Diagnosis not present

## 2021-08-06 DIAGNOSIS — Z5189 Encounter for other specified aftercare: Secondary | ICD-10-CM

## 2021-08-06 DIAGNOSIS — Z79899 Other long term (current) drug therapy: Secondary | ICD-10-CM | POA: Insufficient documentation

## 2021-08-06 DIAGNOSIS — G8929 Other chronic pain: Secondary | ICD-10-CM

## 2021-08-06 DIAGNOSIS — Z978 Presence of other specified devices: Secondary | ICD-10-CM

## 2021-08-06 NOTE — Progress Notes (Signed)
PROVIDER NOTE: Interpretation of information contained herein should be left to medically-trained personnel. Specific patient instructions are provided elsewhere under "Patient Instructions" section of medical record. This document was created in part using STT-dictation technology, any transcriptional errors that may result from this process are unintentional.  Patient: Brian Spencer Type: Established DOB: [birth year] MRN: [id number] P[facility]: [facility]  Service: Procedure DOS: 08/06/2021 Setting: Ambulatory Location: Ambulatory outpatient facility Delivery: Face-to-face Provider: Gaspar Cola, MD Specialty: Interventional Pain Management Specialty designation: 09 Location: Outpatient facility Ref. Prov.: [place]    Primary Reason for Visit: Interventional Pain Management Treatment. CC: Back Pain (lower)  Procedure:          Type: Management of Intrathecal Drug Delivery System (IDDS) - Reservoir Refill [phone]) + Analysis & Programming [id number]).  Rate changed to reflect change in medications.   Indications: 1. Chronic pain syndrome   2. Chronic low back pain (1ry area of Pain) (Bilateral) (R>L)   3. Chronic lower extremity pain (2ry area of Pain) (Right)   4. Failed back surgical syndrome   5. DDD (degenerative disc disease), lumbosacral   6. Fusion of lumbar spine   7. Presence of intrathecal pump   8. Encounter for adjustment or management of infusion pump   9. Encounter for therapeutic procedure   10. Pharmacologic therapy   11. Encounter for medication management   12. Encounter for chronic pain management    Pain Assessment: Self-Reported Pain Score: 6 /10             Reported level is compatible with observation.        Today we will be changing the opioid in his pump.  We will be delivering hydromorphone and we will replace it with fentanyl 2000 mcg/mL.  The hope is that we can get better relief of the pain and at the same time hopefully increase the amount  of time between refills.  Today we will also be changing the concentration on the bupivacaine and the clonidine.  The hope is to eventually get rid of the clonidine altogether.  The patient was informed that he should expect some bumps in the road as we switch from 1 medication to the other, but we will be titrating the medication to effect.  The patient refers that in the past he has used fentanyl patches, but he was having a lot of problems with having those stay around since he sweats a lot and was losing patches.  However, he indicated not having any problems with the medicine itself.  Today we will set up the initial infusion rate of the fentanyl to 250 mcg/day, which would be equivalent to 10.4 mcg/h.  This should be a safe alternative as an initial rate, as it is less than the smallest available fentanyl patch (12.5 mcg/h patch).  According to the programming, the bridge bolus should be over in about 16 hours.    Intrathecal Drug Delivery System (IDDS)  Pump Device:  Manufacturer: Medtronic Model: Synchromed II Model No.: [id number] Serial No.: [id number] H Delivery Route: Intrathecal Type: Programmable  Volume (mL): 40 mL reservoir Priming Volume: 0.207 mL  Calibration Constant: 117.0  MRI compatibility: Conditional   Implant Details:  Date: 10/08/20 (Third Pump) Implanter: Dr. Deetta Perla (Last)  Contact Information: [facility], [place], [place] Last Revision/Replacement: 10/08/2020 Estimated Replacement Date: May 2029  Implant Site: Abdominal Laterality: Right  Catheter: Manufacturer: Medtronic Model:  n/a  Model No.: [id number]  Serial No.: n/a  Implanted Length (cm): 94.1  Catheter Volume (mL): 0.207  Tip Location (Level): T10 Canal Access Site: L2-3  Old Drug content:  Primary Medication Class: Local Anesthetic Medication: PF-Bupivacaine  Concentration: 10.0 mg/mL   Secondary Medication Class: Non-opioid analgesic  Medication: PF-Clonidine  Concentration:  25 mcg/mL  Tertiary Medication Class: Opioid  Medication: PF-Hydromorphone (Dilaudid)  Concentration: 1.0 mg/mL  Fourth Medication Class: none  Medication: n/a  Concentration: n/a  New Drug content:  Primary Medication Class: Opioid analgesic Medication: PF-Fentanyl Concentration: 2000 mcg/mL   Secondary Medication Class: Local anesthetic Medication: PF-bupivacaine Concentration: 30 mg/mL  Tertiary Medication Class: Nonopioid analgesic Medication: PF-clonidine Concentration: 10 mcg/mL  Fourth Medication Class: none  Medication: n/a  Concentration: n/a  PTC/PCA parameters:  Mode: Off/Inactive  Programming:  Type: Simple continuous.  Medication, Concentration, Infusion Program, & Delivery Rate: For up-to-date details please see most recent scanned programming printout.      Changes:  Medication Change: Today we have made changes on the opioid analgesic from hydromorphone to fentanyl and we have also decreased the concentration on the clonidine while increasing the concentration on the bupivacaine. Rate Change:  They infusion rate was changed to reflect the changes in the medications and the medication concentrations.  Reported side-effects or adverse reactions: None reported  Effectiveness: Described as relatively effective, allowing for increase in activities of daily living (ADL) Clinically meaningful improvement in function (CMIF): Sustained CMIF goals met  Plan: Pump refill today w/ Analysis and programming   Pharmacotherapy Assessment   Opioid Analgesic: Intrathecal Hydromorphone (Dilaudid) @ 2.399 mg/d. MME: 9.6 mg/day.   Monitoring:  PMP: PDMP reviewed during this encounter.       Pharmacotherapy: No side-effects or adverse reactions reported. Compliance: No problems identified. Effectiveness: Clinically acceptable. Plan: Refer to "POC". UDS:  Summary  Date Value Ref Range Status  11/12/2015 FINAL  Final    Comment:     ==================================================================== [facility] COMP DRUG ANALYSIS,UR ==================================================================== Test                             Result       Flag       Units Drug Present and Declared for Prescription Verification   7-aminoclonazepam              82           EXPECTED   ng/mg creat    7-aminoclonazepam is an expected metabolite of clonazepam. Source    of clonazepam is a scheduled prescription medication.   Trazodone                      PRESENT      EXPECTED   1,3 chlorophenyl piperazine    PRESENT      EXPECTED    1,3-chlorophenyl piperazine is an expected metabolite of    trazodone.   Verapamil                      PRESENT      EXPECTED Drug Present not Declared for Prescription Verification   Hydromorphone                  1656         UNEXPECTED ng/mg creat    Hydromorphone may be administered as a scheduled prescription    medication; it is also an expected metabolite of hydrocodone. Drug Absent but Declared for Prescription Verification   Venlafaxine  Not Detected UNEXPECTED ==================================================================== Test                      Result    Flag   Units      Ref Range   Creatinine              73               mg/dL      >=20 ==================================================================== Declared Medications:  The flagging and interpretation on this report are based on the  following declared medications.  Unexpected results may arise from  inaccuracies in the declared medications.  **Note: The testing scope of this panel includes these medications:  Clonazepam (Klonopin)  Trazodone (Desyrel)  Venlafaxine (Effexor)  Verapamil (Calan)  **Note: The testing scope of this panel does not include following  reported medications:  Albuterol  Budenoside (Symbicort)  Chlorthalidone  Formoterol (Symbicort)  Multivitamin  Tamsulosin (Flomax)   Tiotropium (Spiriva)  Ubiquinone (Coenzyme Q 10)  Vitamin D2 (Ergocalciferol) ==================================================================== For clinical consultation, please call [phone]. ====================================================================      Pre-op H&P Assessment:  Mr. Abdullah is a 71 y.o. (year old), male patient, seen today for interventional treatment. 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 Mr. Lombardo has a current medication list which includes the following prescription(s): acetaminophen, albuterol, albuterol, amlodipine, atorvastatin, clonazepam, neuriva plus, magnesium oxide, metformin, multivitamin, NON FORMULARY, oxygen-helium, pantoprazole, polyvinyl alcohol, potassium chloride sa, propranolol, tamsulosin, tiotropium bromide-olodaterol, torsemide, trazodone, and venlafaxine xr. His primarily concern today is the Back Pain (lower)  Initial Vital Signs:  Pulse/HCG Rate: 85  Temp: 97.9 F (36.6 C) Resp:   BP: 139/72 SpO2: 92 % (3 liters)  BMI: Estimated body mass index is 40.6 kg/m as calculated from the following:   Height as of this encounter: _0   (1.651 m).   Weight as of this encounter: 244 lb (110.7 kg).  Risk Assessment: Allergies: Reviewed. He is allergic to baclofen and hydralazine.  Allergy Precautions: None required Coagulopathies: Reviewed. None identified.  Blood-thinner therapy: None at this time Active Infection(s): Reviewed. None identified. Mr. Riojas is afebrile  Site Confirmation: Mr. Zaremba was asked to confirm the procedure and laterality before marking the site Procedure checklist: Completed Consent: Before the procedure and under the influence of no sedative(s), amnesic(s), or anxiolytics, the patient was informed of the treatment options, risks and possible complications. To fulfill our ethical and legal obligations, as recommended by the American Medical Association's Code of Ethics, I have informed the patient of my clinical impression; the nature and purpose of the treatment or procedure; the risks, benefits, and possible complications of the intervention; the alternatives, including doing nothing; the risk(s) and benefit(s) of the alternative treatment(s) or procedure(s); and the risk(s) and benefit(s) of doing nothing.  Mr. Purdy was provided with information about the general risks and possible complications associated with most interventional procedures. These include,  but are not limited to: failure to achieve desired goals, infection, bleeding, organ or nerve damage, allergic reactions, paralysis, and/or death.  In addition, he was informed of those risks and possible complications associated to this particular procedure, which include, but are not limited to: damage to the implant; failure to decrease pain; local, systemic, or serious CNS infections, intraspinal abscess with possible cord compression and paralysis, or life-threatening such as meningitis; bleeding; organ damage; nerve injury or damage with subsequent sensory, motor, and/or autonomic system dysfunction, resulting in transient or permanent pain,  numbness, and/or weakness of one or several areas of the body; allergic reactions, either minor or major life-threatening, such as anaphylactic or anaphylactoid reactions.  Furthermore, Mr. Tigert was informed of those risks and complications associated with the medications. These include, but are not limited to: allergic reactions (i.e.: anaphylactic or anaphylactoid reactions); endorphine suppression; bradycardia and/or hypotension; water retention and/or peripheral vascular relaxation leading to lower extremity edema and possible stasis ulcers; respiratory depression and/or shortness of breath; decreased metabolic rate leading to weight gain; swelling or edema; medication-induced neural toxicity; particulate matter embolism and blood vessel occlusion with resultant organ, and/or nervous system infarction; and/or intrathecal granuloma formation with possible spinal cord compression and permanent paralysis.  Before refilling the pump Mr. Langdon was informed that some of the medications used in the devise may not be FDA approved for such use and therefore it constitutes an off-label use of the medications.  Finally, he was informed that Medicine is not an exact science; therefore, there is also the possibility of unforeseen or unpredictable risks and/or possible complications that may result in a catastrophic outcome. The patient indicated having understood very clearly. We have given the patient no guarantees and we have made no promises. Enough time was given to the patient to ask questions, all of which were answered to the patient's satisfaction. Mr. Delap has indicated that he wanted to continue with the procedure. Attestation: I, the ordering provider, attest that I have discussed with the patient the benefits, risks, side-effects, alternatives, likelihood of achieving goals, and potential problems during recovery for the procedure that I have provided informed consent. Date  Time: 08/06/2021  1:01  PM  Pre-Procedure Preparation:  Monitoring: As per clinic protocol. Respiration, ETCO2, SpO2, BP, heart rate and rhythm monitor placed and checked for adequate function Safety Precautions: Patient was assessed for positional comfort and pressure points before starting the procedure. Time-out: I initiated and conducted the "Time-out" before starting the procedure, as per protocol. The patient was asked to participate by confirming the accuracy of the "Time Out" information. Verification of the correct person, site, and procedure were performed and confirmed by me, the nursing staff, and the patient. "Time-out" conducted as per Joint Commission's Universal Protocol (UP.01.01.01). Time: 1329  Description of Procedure:          Position: Supine Target Area: Central-port of intrathecal pump. Approach: Anterior, 90 degree angle approach. Area Prepped: Entire Area around the pump implant. DuraPrep (Iodine Povacrylex [0.7% available iodine] and Isopropyl Alcohol, 74% w/w) Safety Precautions: Aspiration looking for blood return was conducted prior to all injections. At no point did we inject any substances, as a needle was being advanced. No attempts were made at seeking any paresthesias. Safe injection practices and needle disposal techniques used. Medications properly checked for expiration dates. SDV (single dose vial) medications used. Description of the Procedure: Protocol guidelines were followed. Two nurses trained to do implant refills were present during the entire procedure. The refill medication  was checked by both healthcare providers as well as the patient. The patient was included in the "Time-out" to verify the medication. The patient was placed in position. The pump was identified. The area was prepped in the usual manner. The sterile template was positioned over the pump, making sure the side-port location matched that of the pump. Both, the pump and the template were held for stability. The  needle provided in the Medtronic Kit was then introduced thru the center of the template and into the central port. The pump content was aspirated and discarded volume documented. The new medication was slowly infused into the pump, thru the filter, making sure to avoid overpressure of the device. The needle was then removed and the area cleansed, making sure to leave some of the prepping solution back to take advantage of its long term bactericidal properties. The pump was interrogated and programmed to reflect the correct medication, volume, and dosage. The program was printed and taken to the physician for approval. Once checked and signed by the physician, a copy was provided to the patient and another scanned into the EMR.  Vitals:   08/06/21 1257  BP: 139/72  Pulse: 85  Temp: 97.9 F (36.6 C)  SpO2: 92%  Weight: 244 lb (110.7 kg)  Height: _0  (1.651 m)    Start Time: 1340 hrs. End Time:   hrs. Materials & Medications: Medtronic Refill Kit Medication(s): Please see chart orders for details.  Imaging Guidance:          Type of Imaging Technique: None used Indication(s): N/A Exposure Time: No patient exposure Contrast: None used. Fluoroscopic Guidance: N/A Ultrasound Guidance: N/A Interpretation: N/A  Antibiotic Prophylaxis:   Anti-infectives (From admission, onward)    None      Indication(s): None identified  Post-operative Assessment:  Post-procedure Vital Signs:  Pulse/HCG Rate: 85  Temp: 97.9 F (36.6 C) Resp:   BP: 139/72 SpO2: 92 % (3 liters)  EBL: None  Complications: No immediate post-treatment complications observed by team, or reported by patient.  Note: The patient tolerated the entire procedure well. A repeat set of vitals were taken after the procedure and the patient was kept under observation following institutional policy, for this type of procedure. Post-procedural neurological assessment was performed, showing return to baseline, prior to  discharge. The patient was provided with post-procedure discharge instructions, including a section on how to identify potential problems. Should any problems arise concerning this procedure, the patient was given instructions to immediately contact us, at any time, without hesitation. In any case, we plan to contact the patient by telephone for a follow-up status report regarding this interventional procedure.  Comments:  No additional relevant information.  Plan of Care  Orders:  Orders Placed This Encounter  Procedures   PUMP REFILL    Maintain Protocol by having two(2) healthcare providers during procedure and programming.    Scheduling Instructions:     Please refill intrathecal pump today.    Order Specific Question:   Where will this procedure be performed?    Answer:   [facility]   PUMP REFILL    Whenever possible schedule on a procedure today.    Standing Status:   Future    Standing Expiration Date:   12/06/2021    Scheduling Instructions:     Please schedule intrathecal pump refill based on pump programming. Avoid schedule intervals of more than 120 days (4 months).    Order Specific Question:   Where will this  procedure be performed?    Answer:   [facility]   Informed Consent Details: Physician/Practitioner Attestation; Transcribe to consent form and obtain patient signature    Transcribe to consent form and obtain patient signature.    Order Specific Question:   Physician/Practitioner attestation of informed consent for procedure/surgical case    Answer:   I, the physician/practitioner, attest that I have discussed with the patient the benefits, risks, side effects, alternatives, likelihood of achieving goals and potential problems during recovery for the procedure that I have provided informed consent.    Order Specific Question:   Procedure    Answer:   Intrathecal pump refill    Order Specific Question:   Physician/Practitioner performing the procedure     Answer:   Attending Physician: Kathlen Brunswick. Dossie Arbour, MD & designated trained staff    Order Specific Question:   Indication/Reason    Answer:   Chronic Pain Syndrome (G89.4), presence of an intrathecal pump (Z97.8)   Chronic Opioid Analgesic:  Intrathecal Hydromorphone (Dilaudid) @ 2.399 mg/d. MME: 9.6 mg/day.   Medications ordered for procedure: No orders of the defined types were placed in this encounter.  Medications administered: [facility] Belluomini had no medications administered during this visit.  See the medical record for exact dosing, route, and time of administration.  Follow-up plan:   Return for Pump Refill (Max:29mo.       Interventional Therapies  Risk  Complexity Considerations:   Estimated body mass index is 40.77 kg/m as calculated from the following:   Height as of this encounter: _0  (1.651 m).   Weight as of this encounter: 245 lb (111.1 kg). NOTE: ELIQUIS ANTICOAGULATION (Stop: 3days  Re-start: 6 hrs)   Planned  Pending:   (08/06/2021) medications and concentrations in the intrathecal pump change today as well as the infusion rate.  Patient instructed to give [place] call if he experiences any problems and also to call for increases or decreases, depending on his needs.   Under consideration:   Continue management of intrathecal pump  Diagnostic bilateral lumbar facet blocks #1    Completed:   None at this time   Therapeutic  Palliative (PRN) options:   Continue management of intrathecal pump     Recent Visits Date Type Provider Dept  06/27/21 Procedure visit NMilinda Pointer MD [facility]  Showing recent visits within past 90 days and meeting all other requirements Today's Visits Date Type Provider Dept  08/06/21 Procedure visit NMilinda Pointer MD [facility]  Showing today's visits and meeting all other requirements Future Appointments No visits were found meeting these conditions. Showing future appointments within next  90 days and meeting all other requirements  Disposition: Discharge home  Discharge (Date  Time): 08/06/2021; 1404 hrs.   [facility] Care Phys[facility] Note by: FGaspar Cola MD Date: 08/06/2021; Time: 3:05 PM  Disclaimer:  Medicine is not an eChief Strategy Officer The only guarantee in medicine is that nothing is guaranteed. It is important to note that the decision to proceed with this intervention was based on the information collected from the patient. The Data and conclusions were drawn from the patient's questionnaire, the interview, and the physical examination. Because the information was provided in large part by the patient, it cannot be guaranteed that it has not been purposely or unconsciously manipulated. Every effort has been made to obtain as much relevant data as possible for this evaluation. It is important to  note that the conclusions that lead to this procedure are derived in large part from the available data. Always take into account that the treatment will also be dependent on availability of resources and existing treatment guidelines, considered by other Pain Management Practitioners as being common knowledge and practice, at the time of the intervention. For Medico-Legal purposes, it is also important to point out that variation in procedural techniques and pharmacological choices are the acceptable norm. The indications, contraindications, technique, and results of the above procedure should only be interpreted and judged by a Board-Certified Interventional Pain Specialist with extensive familiarity and expertise in the same exact procedure and technique.

## 2021-08-06 NOTE — Progress Notes (Signed)
Safety precautions to be maintained throughout the outpatient stay will include: orient to surroundings, keep bed in low position, maintain call bell within reach at all times, provide assistance with transfer out of bed and ambulation.  

## 2021-08-06 NOTE — Patient Instructions (Signed)
Narcan available at home  Opioid Overdose Opioids are drugs that are often used to treat pain. Opioids include illegal drugs, such as heroin, as well as prescription pain medicines, such as codeine, morphine, hydrocodone, and fentanyl. An opioid overdose happens when you take too much of an opioid. An overdose may be intentional or accidental and can happen with any type of opioid. The effects of an overdose can be mild, dangerous, or even deadly. Opioid overdose is a medical emergency. What are the causes? This condition may be caused by: Taking too much of an opioid on purpose. Taking too much of an opioid by accident. Using two or more substances that contain opioids at the same time. Taking an opioid with a substance that affects your heart, breathing, or blood pressure. These include alcohol, tranquilizers, sleeping pills, illegal drugs, and some over-the-counter medicines. This condition may also happen due to an error made by: A health care provider who prescribes a medicine. The pharmacist who fills the prescription. What increases the risk? This condition is more likely in: Children. They may be attracted to colorful pills. Because of a child's small size, even a small amount of a medicine can be dangerous. Older people. They may be taking many different medicines. Older people may have difficulty reading labels or remembering when they last took their medicines. They may also be more sensitive to the effects of opioids. People with chronic medical conditions, especially heart, liver, kidney, or neurological diseases. People who take an opioid for a long period of time. People who take opioids and use illegal drugs, such as heroin, or other substances, such as alcohol. People who: Have a history of drug or alcohol abuse. Have certain mental health conditions. Have a history of previous drug overdoses. People who take opioids that are not prescribed for them. What are the signs  or symptoms? Symptoms of this condition depend on the type of opioid and the amount that was taken. Common symptoms include: Sleepiness or difficulty waking from sleep. Confusion. Slurred speech. Slowed breathing and a slow pulse (bradycardia). Nausea and vomiting. Abnormally small pupils. Signs and symptoms that require emergency treatment include: Cold, clammy, and pale skin. Blue lips and fingernails. Vomiting. Gurgling sounds in the throat. A pulse that is very slow or difficult to detect. Breathing that is very irregular, slow, noisy, or difficult to detect. Inability to respond to speech or be awakened from sleep (stupor). Seizures. How is this diagnosed? This condition is diagnosed based on your symptoms and medical history. It is important to tell your health care provider: About all of the opioids that you took. When you took the opioids. Whether you were drinking alcohol or using marijuana, cocaine, or other drugs. Your health care provider will do a physical exam. This exam may include: Checking and monitoring your heart rate and rhythm, breathing rate, temperature, and blood pressure. Measuring oxygen levels in your blood. Checking for abnormally small pupils. You may also have blood tests or urine tests. You may have X-rays if you are having severe breathing problems. How is this treated? This condition requires immediate medical treatment and hospitalization. Reversing the effects of the opioid is the first step in treatment. If you have a Narcan kit or naloxone, use it right away. Follow your health care provider's instructions. A friend or family member can also help you with this. The rest of your treatment will be given in the hospital intensive care (ICU). Treatment in the hospital may include: Giving salts and  minerals (electrolytes) along with fluids through an IV. Inserting a breathing tube (endotracheal tube) in your airway to help you breathe if you cannot  breathe on your own or you are in danger of not being able to breathe on your own. Giving oxygen through a small tube under your nose. Passing a tube through your nose and into your stomach (nasogastric tube, or NG tube) to empty your stomach. Giving medicines that: Increase your blood pressure. Relieve nausea and vomiting. Relieve abdominal pain and cramping. Reverse the effects of the opioid (naloxone). Monitoring your heart and oxygen levels. Ongoing counseling and mental health support if you intentionally overdosed or used an illegal drug. Follow these instructions at home:  Medicines Take over-the-counter and prescription medicines only as told by your health care provider. Always ask your health care provider about possible side effects and interactions of any new medicine that you start taking. Keep a list of all the medicines that you take, including over-the-counter medicines. Bring this list with you to all your medical visits. General instructions Drink enough fluid to keep your urine pale yellow. Keep all follow-up visits. This is important. How is this prevented? Read the drug inserts that come with your opioid pain medicines. Take medicines only as told by your health care provider. Do not take more medicine than you are told. Do not take medicines more frequently than you are told. Do not drink alcohol or take sedatives when taking opioids. Do not use illegal or recreational drugs, including cocaine, ecstasy, and marijuana. Do not take opioid medicines that are not prescribed for you. Store all medicines in safety containers that are out of the reach of children. Get help if you are struggling with: Alcohol or drug use. Depression or another mental health problem. Thoughts of hurting yourself or another person. Keep the phone number of your local poison control center near your phone or in your mobile phone. In the [place] the hotline of the [facility]  is (564)852-8970. If you were prescribed naloxone, make sure you understand how to take it. Contact a health care provider if: You need help understanding how to take your pain medicines. You feel your medicines are too strong. You are concerned that your pain medicines are not working well for your pain. You develop new symptoms or side effects when you are taking medicines. Get help right away if: You or someone else is having symptoms of an opioid overdose. Get help even if you are not sure. You have thoughts about hurting yourself or others. You have: Chest pain. Difficulty breathing. A loss of consciousness. These symptoms may represent a serious problem that is an emergency. Do not wait to see if the symptoms will go away. Get medical help right away. Call your local emergency services (911 in the [place] Do not drive yourself to the hospital. If you ever feel like you may hurt yourself or others, or have thoughts about taking your own life, get help right away. You can go to your nearest emergency department or: Call your local emergency services (911 in the [place] Call a suicide crisis helpline, such as the [facility] at (781)776-9121 or 988 in the [place]. This is open 24 hours a day in the [place] Text the Crisis Text Line at 432-224-4920 (in the [place].). Summary Opioids are drugs that are often used to treat pain. Opioids include illegal drugs, such as heroin, as well as prescription pain medicines. An opioid overdose happens when  you take too much of an opioid. Overdoses can be intentional or accidental. Opioid overdose is very dangerous. It is a life-threatening emergency. If you or someone you know is experiencing an opioid overdose, get help right away. This information is not intended to replace advice given to you by your health care provider. Make sure you discuss any questions you have with your health care provider. Document Revised: 08/29/2020 Document  Reviewed: 05/16/2020 Else[employer] Patient Education  [place].

## 2021-08-07 MED FILL — Medication: INTRATHECAL | Qty: 1 | Status: AC

## 2021-08-08 ENCOUNTER — Telehealth: Payer: Self-pay

## 2021-08-08 NOTE — Telephone Encounter (Signed)
Having nausea and vomiting since a.m. of 08-07-21. Believes it may be due to the change in IT pump meds. Went to [place], received Zofran. Will call patient back on Monday, speak with Dr. Dossie Arbour about this.

## 2021-08-08 NOTE — Telephone Encounter (Signed)
Called Dr Dossie Arbour and left a detailed messsage of patient complaints.

## 2021-08-08 NOTE — Telephone Encounter (Signed)
He has been sick for 3 days and thinks it is the pump meds. Please call him.

## 2021-08-12 ENCOUNTER — Telehealth: Payer: Self-pay | Admitting: *Deleted

## 2021-08-12 ENCOUNTER — Other Ambulatory Visit (HOSPITAL_BASED_OUTPATIENT_CLINIC_OR_DEPARTMENT_OTHER): Payer: Worker's Compensation | Admitting: Pain Medicine

## 2021-08-12 DIAGNOSIS — M542 Cervicalgia: Secondary | ICD-10-CM | POA: Insufficient documentation

## 2021-08-12 DIAGNOSIS — G4737 Central sleep apnea in conditions classified elsewhere: Secondary | ICD-10-CM | POA: Insufficient documentation

## 2021-08-12 DIAGNOSIS — K06020 Generalized gingival recession, unspecified: Secondary | ICD-10-CM | POA: Insufficient documentation

## 2021-08-12 DIAGNOSIS — E662 Morbid (severe) obesity with alveolar hypoventilation: Secondary | ICD-10-CM | POA: Insufficient documentation

## 2021-08-12 DIAGNOSIS — F528 Other sexual dysfunction not due to a substance or known physiological condition: Secondary | ICD-10-CM | POA: Insufficient documentation

## 2021-08-12 DIAGNOSIS — I5032 Chronic diastolic (congestive) heart failure: Secondary | ICD-10-CM | POA: Insufficient documentation

## 2021-08-12 DIAGNOSIS — R311 Benign essential microscopic hematuria: Secondary | ICD-10-CM | POA: Insufficient documentation

## 2021-08-12 DIAGNOSIS — G4731 Primary central sleep apnea: Secondary | ICD-10-CM | POA: Insufficient documentation

## 2021-08-12 DIAGNOSIS — F17200 Nicotine dependence, unspecified, uncomplicated: Secondary | ICD-10-CM | POA: Insufficient documentation

## 2021-08-12 DIAGNOSIS — G43009 Migraine without aura, not intractable, without status migrainosus: Secondary | ICD-10-CM | POA: Insufficient documentation

## 2021-08-12 DIAGNOSIS — M25361 Other instability, right knee: Secondary | ICD-10-CM | POA: Insufficient documentation

## 2021-08-12 DIAGNOSIS — M12562 Traumatic arthropathy, left knee: Secondary | ICD-10-CM | POA: Insufficient documentation

## 2021-08-12 DIAGNOSIS — M25549 Pain in joints of unspecified hand: Secondary | ICD-10-CM | POA: Insufficient documentation

## 2021-08-12 DIAGNOSIS — R200 Anesthesia of skin: Secondary | ICD-10-CM | POA: Insufficient documentation

## 2021-08-12 DIAGNOSIS — F411 Generalized anxiety disorder: Secondary | ICD-10-CM | POA: Insufficient documentation

## 2021-08-12 DIAGNOSIS — B182 Chronic viral hepatitis C: Secondary | ICD-10-CM | POA: Insufficient documentation

## 2021-08-12 DIAGNOSIS — R079 Chest pain, unspecified: Secondary | ICD-10-CM | POA: Insufficient documentation

## 2021-08-12 DIAGNOSIS — F331 Major depressive disorder, recurrent, moderate: Secondary | ICD-10-CM | POA: Insufficient documentation

## 2021-08-12 DIAGNOSIS — G4733 Obstructive sleep apnea (adult) (pediatric): Secondary | ICD-10-CM | POA: Insufficient documentation

## 2021-08-12 DIAGNOSIS — N138 Other obstructive and reflux uropathy: Secondary | ICD-10-CM | POA: Insufficient documentation

## 2021-08-12 DIAGNOSIS — N401 Enlarged prostate with lower urinary tract symptoms: Secondary | ICD-10-CM | POA: Insufficient documentation

## 2021-08-12 DIAGNOSIS — E119 Type 2 diabetes mellitus without complications: Secondary | ICD-10-CM | POA: Insufficient documentation

## 2021-08-12 DIAGNOSIS — R3129 Other microscopic hematuria: Secondary | ICD-10-CM | POA: Insufficient documentation

## 2021-08-12 DIAGNOSIS — I10 Essential (primary) hypertension: Secondary | ICD-10-CM | POA: Insufficient documentation

## 2021-08-12 DIAGNOSIS — M7062 Trochanteric bursitis, left hip: Secondary | ICD-10-CM | POA: Insufficient documentation

## 2021-08-12 DIAGNOSIS — K219 Gastro-esophageal reflux disease without esophagitis: Secondary | ICD-10-CM | POA: Insufficient documentation

## 2021-08-12 DIAGNOSIS — H9313 Tinnitus, bilateral: Secondary | ICD-10-CM | POA: Insufficient documentation

## 2021-08-12 DIAGNOSIS — M1250 Traumatic arthropathy, unspecified site: Secondary | ICD-10-CM | POA: Insufficient documentation

## 2021-08-12 DIAGNOSIS — M79662 Pain in left lower leg: Secondary | ICD-10-CM | POA: Insufficient documentation

## 2021-08-12 DIAGNOSIS — K08409 Partial loss of teeth, unspecified cause, unspecified class: Secondary | ICD-10-CM | POA: Insufficient documentation

## 2021-08-12 DIAGNOSIS — J069 Acute upper respiratory infection, unspecified: Secondary | ICD-10-CM | POA: Insufficient documentation

## 2021-08-12 DIAGNOSIS — F4321 Adjustment disorder with depressed mood: Secondary | ICD-10-CM | POA: Insufficient documentation

## 2021-08-12 DIAGNOSIS — F4310 Post-traumatic stress disorder, unspecified: Secondary | ICD-10-CM | POA: Insufficient documentation

## 2021-08-12 DIAGNOSIS — E559 Vitamin D deficiency, unspecified: Secondary | ICD-10-CM | POA: Insufficient documentation

## 2021-08-12 DIAGNOSIS — R609 Edema, unspecified: Secondary | ICD-10-CM | POA: Insufficient documentation

## 2021-08-12 DIAGNOSIS — R0902 Hypoxemia: Secondary | ICD-10-CM | POA: Insufficient documentation

## 2021-08-12 DIAGNOSIS — N319 Neuromuscular dysfunction of bladder, unspecified: Secondary | ICD-10-CM | POA: Insufficient documentation

## 2021-08-12 DIAGNOSIS — F4312 Post-traumatic stress disorder, chronic: Secondary | ICD-10-CM | POA: Insufficient documentation

## 2021-08-12 DIAGNOSIS — F0630 Mood disorder due to known physiological condition, unspecified: Secondary | ICD-10-CM | POA: Insufficient documentation

## 2021-08-12 DIAGNOSIS — D649 Anemia, unspecified: Secondary | ICD-10-CM | POA: Insufficient documentation

## 2021-08-12 DIAGNOSIS — J9610 Chronic respiratory failure, unspecified whether with hypoxia or hypercapnia: Secondary | ICD-10-CM | POA: Insufficient documentation

## 2021-08-12 DIAGNOSIS — M961 Postlaminectomy syndrome, not elsewhere classified: Secondary | ICD-10-CM | POA: Insufficient documentation

## 2021-08-12 DIAGNOSIS — B1920 Unspecified viral hepatitis C without hepatic coma: Secondary | ICD-10-CM | POA: Insufficient documentation

## 2021-08-12 DIAGNOSIS — C61 Malignant neoplasm of prostate: Secondary | ICD-10-CM | POA: Insufficient documentation

## 2021-08-12 DIAGNOSIS — M519 Unspecified thoracic, thoracolumbar and lumbosacral intervertebral disc disorder: Secondary | ICD-10-CM | POA: Insufficient documentation

## 2021-08-12 DIAGNOSIS — I714 Abdominal aortic aneurysm, without rupture, unspecified: Secondary | ICD-10-CM | POA: Insufficient documentation

## 2021-08-12 DIAGNOSIS — E291 Testicular hypofunction: Secondary | ICD-10-CM | POA: Insufficient documentation

## 2021-08-12 DIAGNOSIS — M1711 Unilateral primary osteoarthritis, right knee: Secondary | ICD-10-CM | POA: Insufficient documentation

## 2021-08-12 DIAGNOSIS — R112 Nausea with vomiting, unspecified: Secondary | ICD-10-CM | POA: Insufficient documentation

## 2021-08-12 MED ORDER — ONDANSETRON 8 MG PO TBDP: 8.0000 mg | ORAL_TABLET | Freq: Two times a day (BID) | ORAL | 0 refills | Status: AC

## 2021-08-12 NOTE — Progress Notes (Signed)
The patient calls indicating that he is having nausea and vomiting which apparently, according to him, started around the time that his intrathecal medications were changed.  Today he complains of nausea and vomiting, but does not indicate having any significant exacerbation of the pain.  Review of the calculations did not show any significant changes that would explain his nausea and vomiting.  However, to assist him with this I have sent a prescription to the pharmacy for ondansetron and I have recommended that he start taking Gatorade to correct any electrolyte abnormalities.  Today he refers that symptoms have been improving since he started with them and he is now able to keep down some fluids.  Initially I thought about the possibility of opioid withdrawals, but looking at the medications and concentrations being the liver, it is highly unlikely.  He also has a history of having used fentanyl patches in the past without any problems and therefore it is unlikely that he will developed any changes to this medication.  It is my impression that he may be having a viral gastroenteritis and I have recommended that he contact his primary care physician for further recommendations and evaluation.

## 2021-08-12 NOTE — Progress Notes (Addendum)
PROVIDER NOTE: Interpretation of information contained herein should be left to medically-trained personnel. Specific patient instructions are provided elsewhere under "Patient Instructions" section of medical record. This document was created in part using STT-dictation technology, any transcriptional errors that may result from this process are unintentional.  Patient: Brian Spencer Type: Established DOB: [birth year] MRN: [id number] P[facility]: [facility]  Service: Procedure DOS: 08/13/2021 Setting: Ambulatory Location: Ambulatory outpatient facility Delivery: Face-to-face Provider: Gaspar Cola, MD Specialty: Interventional Pain Management Specialty designation: 09 Location: Outpatient facility Ref. Prov.: Rice    Primary Reason for Visit: Interventional Pain Management Treatment. CC: Back Pain  Procedure:          Type: Management of Intrathecal Drug Delivery System (IDDS) - Analysis & Programming [phone]). 20% rate increase.  Indications: 1. Chronic pain syndrome   2. Chronic low back pain (1ry area of Pain) (Bilateral) (R>L)   3. Chronic lower extremity pain (2ry area of Pain) (Right)   4. Failed back surgical syndrome   5. DDD (degenerative disc disease), lumbosacral   6. Fusion of lumbar spine   7. Presence of intrathecal pump   8. Encounter for adjustment or management of infusion pump   9. Encounter for therapeutic procedure   10. Pharmacologic therapy   11. Encounter for medication management   12. Encounter for chronic pain management    Pain Assessment: Self-Reported Pain Score: 9 /10             Reported level is compatible with observation.        Note: The patient called yesterday indicating that he was having nausea vomiting and increased pain.  He was provided with a prescription for ondansetron and a steroid taper to help with the pain.  He was also encouraged to contact his primary care physician since calculations made of his hydromorphone to  fentanyl would have suggested that he is getting an increase in his daily MME which would go against the possibility of a withdrawal therefore, there is also the possibility that the nausea and vomiting that he has been experiencing could be secondary to a gastroenteritis.  However, I did expect the patient to have a bumpy transition from his prior medications to these, not necessarily because of the pharmacology of the medications, but because he was clearly anxious about "change" in general.  Because he is indicating having more pain, today we will increase the infusion rate on his pump by 20%.  We will continue to do this, as needed, until we reach a level where he is comfortable with his infusion rate.    Intrathecal Drug Delivery System (IDDS)  Pump Device:  Manufacturer: Medtronic Model: Synchromed II Model No.: [id number] Serial No.: [id number] H Delivery Route: Intrathecal Type: Programmable  Volume (mL): 40 mL reservoir Priming Volume: 0.207 mL  Calibration Constant: 117.0  MRI compatibility: Conditional   Implant Details:  Date: 10/08/20 (Third Pump) Implanter: Dr. Deetta Perla (Last)  Contact Information: [facility], [place], [place] Last Revision/Replacement: 10/08/2020 Estimated Replacement Date: May 2029  Implant Site: Abdominal Laterality: Right  Catheter: Manufacturer: Medtronic Model:  n/a  Model No.: [id number]  Serial No.: n/a  Implanted Length (cm): 94.1  Catheter Volume (mL): 0.207  Tip Location (Level): T10 Canal Access Site: L2-3  Drug content:  Primary Medication Class: Opioid analgesic Medication: PF-Fentanyl Concentration: 2000 mcg/mL   Secondary Medication Class: Local anesthetic Medication: PF-bupivacaine Concentration: 30 mg/mL  Tertiary Medication Class: Nonopioid analgesic Medication: PF-clonidine Concentration: 10 mcg/mL  Fourth Medication Class:  none  Medication: n/a  Concentration: n/a  PTC/PCA parameters:  Mode:  Off/Inactive  Programming:  Type: Simple continuous.  Medication, Concentration, Infusion Program, & Delivery Rate: For up-to-date details please see most recent scanned programming printout.      Changes:  Medication Change: None at this point Rate Change: 20% increase  Reported side-effects or adverse reactions: None reported  Effectiveness: Described as relatively effective, allowing for increase in activities of daily living (ADL) Clinically meaningful improvement in function (CMIF): Sustained CMIF goals met  Plan: Pump refill today   Pharmacotherapy Assessment   Opioid Analgesic: Intrathecal Hydromorphone (Dilaudid) @ 2.399 mg/d. MME: 9.6 mg/day.   Monitoring: [place] PMP: PDMP reviewed during this encounter.       Pharmacotherapy: No side-effects or adverse reactions reported. Compliance: No problems identified. Effectiveness: Clinically acceptable. Plan: Refer to "POC". UDS:  Summary  Date Value Ref Range Status  11/12/2015 FINAL  Final    Comment:    ==================================================================== [facility] COMP DRUG ANALYSIS,UR ==================================================================== Test                             Result       Flag       Units Drug Present and Declared for Prescription Verification   7-aminoclonazepam              82           EXPECTED   ng/mg creat    7-aminoclonazepam is an expected metabolite of clonazepam. Source    of clonazepam is a scheduled prescription medication.   Trazodone                      PRESENT      EXPECTED   1,3 chlorophenyl piperazine    PRESENT      EXPECTED    1,3-chlorophenyl piperazine is an expected metabolite of    trazodone.   Verapamil                      PRESENT      EXPECTED Drug Present not Declared for Prescription Verification   Hydromorphone                  1656         UNEXPECTED ng/mg creat    Hydromorphone may be administered as a scheduled prescription    medication; it  is also an expected metabolite of hydrocodone. Drug Absent but Declared for Prescription Verification   Venlafaxine                    Not Detected UNEXPECTED ==================================================================== Test                      Result    Flag   Units      Ref Range   Creatinine              73               mg/dL      >=20 ==================================================================== Declared Medications:  The flagging and interpretation on this report are based on the  following declared medications.  Unexpected results may arise from  inaccuracies in the declared medications.  **Note: The testing scope of this panel includes these medications:  Clonazepam (Klonopin)  Trazodone (Desyrel)  Venlafaxine (Effexor)  Verapamil (Calan)  **Note: The testing scope of this panel does  not include following  reported medications:  Albuterol  Budenoside (Symbicort)  Chlorthalidone  Formoterol (Symbicort)  Multivitamin  Tamsulosin (Flomax)  Tiotropium (Spiriva)  Ubiquinone (Coenzyme Q 10)  Vitamin D2 (Ergocalciferol) ==================================================================== For clinical consultation, please call [phone]. ====================================================================      Pre-op H&P Assessment:  Brian Spencer is a 71 y.o. (year old), male patient, seen today for interventional treatment. 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 Brian Spencer has a current medication list which includes the following prescription(s): acetaminophen, albuterol, albuterol, amlodipine, atorvastatin, clonazepam, neuriva plus, magnesium oxide, metformin, methylprednisolone, multivitamin, NON FORMULARY, ondansetron, oxygen-helium, pantoprazole, polyvinyl alcohol, potassium chloride sa, propranolol, tamsulosin, tiotropium bromide-olodaterol, torsemide, trazodone, and venlafaxine xr. His primarily concern today is the Back Pain  Initial Vital Signs:  Pulse/HCG Rate: 88  Temp: (!) 97.3 F (36.3 C) Resp:   BP: 134/87 SpO2: 97 % (3)  BMI: Estimated body mass index is 39.11 kg/m as calculated from the following:   Height as of this encounter: 5' 5"  (1.651 m).   Weight as of this encounter: 235 lb (106.6 kg).  Risk Assessment: Allergies: Reviewed. He is allergic to baclofen and hydralazine.  Allergy Precautions: None required Coagulopathies: Reviewed. None identified.  Blood-thinner therapy: None at this time Active Infection(s): Reviewed. None identified. Brian Spencer is afebrile  Site Confirmation: Brian Spencer was asked to confirm the procedure and laterality before marking the site Procedure checklist: Completed Consent: Before the procedure and under the influence of no sedative(s), amnesic(s), or anxiolytics, the patient was informed of the treatment options, risks and possible complications. To fulfill our ethical and legal obligations, as recommended by the American Medical Association's Code of Ethics, I have informed the patient of my clinical impression; the nature and purpose of the treatment or procedure; the risks, benefits, and  possible complications of the intervention; the alternatives, including doing nothing; the risk(s) and benefit(s) of the alternative treatment(s) or procedure(s); and the risk(s) and benefit(s) of doing nothing.  Brian Spencer was provided with information about the general risks and possible complications associated with most interventional procedures. These include, but are not limited to: failure to achieve desired goals, infection, bleeding, organ or nerve damage, allergic reactions, paralysis, and/or death.  In addition, he was informed of those risks and possible complications associated to this particular procedure, which include, but are not limited to: damage to the implant; failure to decrease pain; local, systemic, or serious CNS infections, intraspinal abscess with possible cord compression and paralysis, or life-threatening such as meningitis; bleeding; organ damage; nerve injury or damage with subsequent sensory, motor, and/or autonomic system dysfunction, resulting in transient or permanent pain, numbness, and/or weakness of one or several areas of the body; allergic reactions, either minor or major life-threatening, such as anaphylactic or anaphylactoid reactions.  Furthermore, Brian Spencer was informed of those risks and complications associated with the medications. These include, but are not limited to: allergic reactions (  i.e.: anaphylactic or anaphylactoid reactions); endorphine suppression; bradycardia and/or hypotension; water retention and/or peripheral vascular relaxation leading to lower extremity edema and possible stasis ulcers; respiratory depression and/or shortness of breath; decreased metabolic rate leading to weight gain; swelling or edema; medication-induced neural toxicity; particulate matter embolism and blood vessel occlusion with resultant organ, and/or nervous system infarction; and/or intrathecal granuloma formation with possible spinal cord compression and permanent  paralysis.  Before refilling the pump Brian Spencer was informed that some of the medications used in the devise may not be FDA approved for such use and therefore it constitutes an off-label use of the medications.  Finally, he was informed that Medicine is not an exact science; therefore, there is also the possibility of unforeseen or unpredictable risks and/or possible complications that may result in a catastrophic outcome. The patient indicated having understood very clearly. We have given the patient no guarantees and we have made no promises. Enough time was given to the patient to ask questions, all of which were answered to the patient's satisfaction. Brian Spencer has indicated that he wanted to continue with the procedure. Attestation: I, the ordering provider, attest that I have discussed with the patient the benefits, risks, side-effects, alternatives, likelihood of achieving goals, and potential problems during recovery for the procedure that I have provided informed consent. Date  Time: 08/13/2021 12:35 PM  Pre-Procedure Preparation:  Monitoring: As per clinic protocol. Respiration, ETCO2, SpO2, BP, heart rate and rhythm monitor placed and checked for adequate function Safety Precautions: Patient was assessed for positional comfort and pressure points before starting the procedure. Time-out: I initiated and conducted the "Time-out" before starting the procedure, as per protocol. The patient was asked to participate by confirming the accuracy of the "Time Out" information. Verification of the correct person, site, and procedure were performed and confirmed by me, the nursing staff, and the patient. "Time-out" conducted as per Joint Commission's Universal Protocol (UP.01.01.01). Time:    Description of Procedure:          Position: Supine Target Area: Central-port of intrathecal pump. Approach: Anterior, 90 degree angle approach. Area Prepped: Entire Area around the pump implant. DuraPrep  (Iodine Povacrylex [0.7% available iodine] and Isopropyl Alcohol, 74% w/w) Safety Precautions: Aspiration looking for blood return was conducted prior to all injections. At no point did we inject any substances, as a needle was being advanced. No attempts were made at seeking any paresthesias. Safe injection practices and needle disposal techniques used. Medications properly checked for expiration dates. SDV (single dose vial) medications used. Description of the Procedure: Protocol guidelines were followed. Two nurses trained to do implant refills were present during the entire procedure. The refill medication was checked by both healthcare providers as well as the patient. The patient was included in the "Time-out" to verify the medication. The patient was placed in position. The pump was identified. The area was prepped in the usual manner. The sterile template was positioned over the pump, making sure the side-port location matched that of the pump. Both, the pump and the template were held for stability. The needle provided in the Medtronic Kit was then introduced thru the center of the template and into the central port. The pump content was aspirated and discarded volume documented. The new medication was slowly infused into the pump, thru the filter, making sure to avoid overpressure of the device. The needle was then removed and the area cleansed, making sure to leave some of the prepping solution back to take advantage of  its long term bactericidal properties. The pump was interrogated and programmed to reflect the correct medication, volume, and dosage. The program was printed and taken to the physician for approval. Once checked and signed by the physician, a copy was provided to the patient and another scanned into the EMR.  Vitals:   08/13/21 1233  BP: 134/87  Pulse: 88  Temp: (!) 97.3 F (36.3 C)  SpO2: 97%  Weight: 235 lb (106.6 kg)  Height: 5' 5"  (1.651 m)    Start Time:   hrs. End  Time:   hrs. Materials & Medications: Medtronic Refill Kit Medication(s): Please see chart orders for details.  Imaging Guidance:          Type of Imaging Technique: None used Indication(s): N/A Exposure Time: No patient exposure Contrast: None used. Fluoroscopic Guidance: N/A Ultrasound Guidance: N/A Interpretation: N/A  Antibiotic Prophylaxis:   Anti-infectives (From admission, onward)    None      Indication(s): None identified  Post-operative Assessment:  Post-procedure Vital Signs:  Pulse/HCG Rate: 88  Temp: (!) 97.3 F (36.3 C) Resp:   BP: 134/87 SpO2: 97 % (3)  EBL: None  Complications: No immediate post-treatment complications observed by team, or reported by patient.  Note: The patient tolerated the entire procedure well. A repeat set of vitals were taken after the procedure and the patient was kept under observation following institutional policy, for this type of procedure. Post-procedural neurological assessment was performed, showing return to baseline, prior to discharge. The patient was provided with post-procedure discharge instructions, including a section on how to identify potential problems. Should any problems arise concerning this procedure, the patient was given instructions to immediately contact us, at any time, without hesitation. In any case, we plan to contact the patient by telephone for a follow-up status report regarding this interventional procedure.  Comments:  No additional relevant information.  Plan of Care  Orders:  Orders Placed This Encounter  Procedures   PUMP REPROGRAM    Follow programming protocol by having two(2) healthcare providers present during programming.    Scheduling Instructions:     Please perform the following adjustment: Increase rate by 20%.    Order Specific Question:   Where will this procedure be performed?    Answer:   [facility]   PUMP REPROGRAM    Standing Status:   Standing    Number of  Occurrences:   10    Standing Expiration Date:   11/13/2021    Scheduling Instructions:     PRN    Order Specific Question:   Where will this procedure be performed?    Answer:   [facility]   PUMP REFILL    Whenever possible schedule on a procedure today.    Standing Status:   Future    Standing Expiration Date:   12/13/2021    Scheduling Instructions:     Please schedule intrathecal pump refill based on pump programming. Avoid schedule intervals of more than 120 days (4 months).    Order Specific Question:   Where will this procedure be performed?    Answer:   [facility]   Chronic Opioid Analgesic:  Intrathecal Hydromorphone (Dilaudid) @ 2.399 mg/d. MME: 9.6 mg/day.   Medications ordered for procedure: Meds ordered this encounter  Medications   methylPREDNISolone (MEDROL) 4 MG TBPK tablet    Sig: Follow package instructions.    Dispense:  21 tablet    Refill:  0    Do not  add to the "Automatic Refill" notification system.   Medications administered: Brian Spencer had no medications administered during this visit.  See the medical record for exact dosing, route, and time of administration.  Follow-up plan:   Return for Pump Refill (Max:22mo + PRN increases.       Interventional Therapies  Risk  Complexity Considerations:   Estimated body mass index is 40.77 kg/m as calculated from the following:   Height as of this encounter: 5' 5"  (1.651 m).   Weight as of this encounter: 245 lb (111.1 kg). NOTE: ELIQUIS ANTICOAGULATION (Stop: 3days  Re-start: 6 hrs)   Planned  Pending:   (08/06/2021) medications and concentrations in the intrathecal pump change today as well as the infusion rate.  Patient instructed to give [place] call if he experiences any problems and also to call for increases or decreases, depending on his needs.   Under consideration:   Continue management of intrathecal pump  Diagnostic bilateral lumbar facet blocks #1    Completed:   None at  this time   Therapeutic  Palliative (PRN) options:   Continue management of intrathecal pump      Recent Visits Date Type Provider Dept  08/06/21 Procedure visit NMilinda Pointer MD [facility]  06/27/21 Procedure visit NMilinda Pointer MD [facility]  Showing recent visits within past 90 days and meeting all other requirements Today's Visits Date Type Provider Dept  08/13/21 Procedure visit NMilinda Pointer MD [facility]  Showing today's visits and meeting all other requirements Future Appointments No visits were found meeting these conditions. Showing future appointments within next 90 days and meeting all other requirements  Disposition: Discharge home  Discharge (Date  Time): 08/13/2021;   hrs.   Primary Care Physician[facility] Note by: FGaspar Cola MD Date: 08/13/2021; Time: 12:55 PM  Disclaimer:  Medicine is not an eChief Strategy Officer The only guarantee in medicine is that nothing is guaranteed. It is important to note that the decision to proceed with this intervention was based on the information collected from the patient. The Data and conclusions were drawn from the patient's questionnaire, the interview, and the physical examination. Because the information was provided in large part by the patient, it cannot be guaranteed that it has not been purposely or unconsciously manipulated. Every effort has been made to obtain as much relevant data as possible for this evaluation. It is important to note that the conclusions that lead to this procedure are derived in large part from the available data. Always take into account that the treatment will also be dependent on availability of resources and existing treatment guidelines, considered by other Pain Management Practitioners as being common knowledge and practice, at the time of the intervention. For Medico-Legal purposes, it is  also important to point out that variation in procedural techniques and pharmacological choices are the acceptable norm. The indications, contraindications, technique, and results of the above procedure should only be interpreted and judged by a Board-Certified Interventional Pain Specialist with extensive familiarity and expertise in the same exact procedure and technique.

## 2021-08-13 ENCOUNTER — Ambulatory Visit: Payer: Worker's Compensation | Attending: Pain Medicine | Admitting: Pain Medicine

## 2021-08-13 ENCOUNTER — Encounter: Payer: Self-pay | Admitting: Pain Medicine

## 2021-08-13 VITALS — BP 134/87 | HR 88 | Temp 97.3°F | Ht 65.0 in | Wt 235.0 lb

## 2021-08-13 DIAGNOSIS — M79604 Pain in right leg: Secondary | ICD-10-CM | POA: Insufficient documentation

## 2021-08-13 DIAGNOSIS — Z978 Presence of other specified devices: Secondary | ICD-10-CM | POA: Diagnosis present

## 2021-08-13 DIAGNOSIS — M5441 Lumbago with sciatica, right side: Secondary | ICD-10-CM | POA: Insufficient documentation

## 2021-08-13 DIAGNOSIS — M51379 Other intervertebral disc degeneration, lumbosacral region without mention of lumbar back pain or lower extremity pain: Secondary | ICD-10-CM

## 2021-08-13 DIAGNOSIS — Z451 Encounter for adjustment and management of infusion pump: Secondary | ICD-10-CM | POA: Diagnosis present

## 2021-08-13 DIAGNOSIS — G894 Chronic pain syndrome: Secondary | ICD-10-CM

## 2021-08-13 DIAGNOSIS — G8929 Other chronic pain: Secondary | ICD-10-CM | POA: Diagnosis present

## 2021-08-13 DIAGNOSIS — Z5189 Encounter for other specified aftercare: Secondary | ICD-10-CM

## 2021-08-13 DIAGNOSIS — M5137 Other intervertebral disc degeneration, lumbosacral region: Secondary | ICD-10-CM | POA: Diagnosis present

## 2021-08-13 DIAGNOSIS — Z79899 Other long term (current) drug therapy: Secondary | ICD-10-CM

## 2021-08-13 DIAGNOSIS — M4326 Fusion of spine, lumbar region: Secondary | ICD-10-CM

## 2021-08-13 DIAGNOSIS — M961 Postlaminectomy syndrome, not elsewhere classified: Secondary | ICD-10-CM | POA: Diagnosis present

## 2021-08-13 MED ORDER — METHYLPREDNISOLONE 4 MG PO TBPK: ORAL_TABLET | ORAL | 0 refills | Status: AC

## 2021-08-14 ENCOUNTER — Other Ambulatory Visit: Payer: Self-pay

## 2021-08-14 ENCOUNTER — Telehealth: Payer: Self-pay

## 2021-08-14 ENCOUNTER — Encounter: Payer: Self-pay | Admitting: Pain Medicine

## 2021-08-14 ENCOUNTER — Ambulatory Visit: Payer: Worker's Compensation | Attending: Pain Medicine | Admitting: Pain Medicine

## 2021-08-14 VITALS — BP 148/85 | HR 98 | Temp 98.4°F | Resp 16 | Ht 65.0 in | Wt 231.0 lb

## 2021-08-14 DIAGNOSIS — Z451 Encounter for adjustment and management of infusion pump: Secondary | ICD-10-CM | POA: Diagnosis present

## 2021-08-14 DIAGNOSIS — Z79899 Other long term (current) drug therapy: Secondary | ICD-10-CM

## 2021-08-14 DIAGNOSIS — Z5189 Encounter for other specified aftercare: Secondary | ICD-10-CM

## 2021-08-14 DIAGNOSIS — M961 Postlaminectomy syndrome, not elsewhere classified: Secondary | ICD-10-CM

## 2021-08-14 DIAGNOSIS — M79604 Pain in right leg: Secondary | ICD-10-CM | POA: Insufficient documentation

## 2021-08-14 DIAGNOSIS — M5441 Lumbago with sciatica, right side: Secondary | ICD-10-CM | POA: Diagnosis present

## 2021-08-14 DIAGNOSIS — M5137 Other intervertebral disc degeneration, lumbosacral region: Secondary | ICD-10-CM | POA: Insufficient documentation

## 2021-08-14 DIAGNOSIS — Z978 Presence of other specified devices: Secondary | ICD-10-CM

## 2021-08-14 DIAGNOSIS — M51379 Other intervertebral disc degeneration, lumbosacral region without mention of lumbar back pain or lower extremity pain: Secondary | ICD-10-CM

## 2021-08-14 DIAGNOSIS — G8929 Other chronic pain: Secondary | ICD-10-CM

## 2021-08-14 DIAGNOSIS — M4326 Fusion of spine, lumbar region: Secondary | ICD-10-CM | POA: Diagnosis present

## 2021-08-14 DIAGNOSIS — G894 Chronic pain syndrome: Secondary | ICD-10-CM

## 2021-08-14 NOTE — Telephone Encounter (Signed)
Patietn called and wants to comoe in today for a pump adjustment.  Dr Dossie Arbour states it is ok for him to come in.

## 2021-08-14 NOTE — Progress Notes (Signed)
Safety precautions to be maintained throughout the outpatient stay will include: orient to surroundings, keep bed in low position, maintain call bell within reach at all times, provide assistance with transfer out of bed and ambulation.  

## 2021-08-14 NOTE — Progress Notes (Signed)
PROVIDER NOTE: Interpretation of information contained herein should be left to medically-trained personnel. Specific patient instructions are provided elsewhere under "Patient Instructions" section of medical record. This document was created in part using STT-dictation technology, any transcriptional errors that may result from this process are unintentional.  Patient: Brian Spencer Type: Established DOB: April 16, 1950 MRN: [id number] P[facility]: [facility]  Service: Procedure DOS: 08/14/2021 Setting: Ambulatory Location: Ambulatory outpatient facility Delivery: Face-to-face Provider: Gaspar Cola, MD Specialty: Interventional Pain Management Specialty designation: 09 Location: Outpatient facility Ref. Prov.: [place]    Primary Reason for Visit: Interventional Pain Management Treatment. CC: Back Pain (Lumbar bialteral )  Procedure:          Type: Management of Intrathecal Drug Delivery System (IDDS) - Analysis & Programming [phone]). 20% rate increase.  Indications: 1. Chronic pain syndrome   2. Chronic low back pain (1ry area of Pain) (Bilateral) (R>L)   3. Chronic lower extremity pain (2ry area of Pain) (Right)   4. Failed back surgical syndrome   5. DDD (degenerative disc disease), lumbosacral   6. Fusion of lumbar spine   7. Presence of intrathecal pump   8. Encounter for adjustment or management of infusion pump   9. Encounter for therapeutic procedure   10. Pharmacologic therapy   11. Encounter for medication management   12. Encounter for chronic pain management    Pain Assessment: Self-Reported Pain Score: 6 /10             Reported level is compatible with observation.        The patient came into the clinics yesterday and he had an increase of 20% of his basal rate programmed.  Today he has called [place] and he indicated that it does not seem to be enough and he will like to have it increased further.  Today we will proceed with another 20% increase.  Again the  patient is encouraged to continue calling us for pump adjustments as needed until we get him to the point where he is comfortable.  With today's increase, his pump is delivering fentanyl 358.9 mcg/day; bupivacaine 5.384 mg/day; and clonidine 1.795 mcg/day.  On the patient's next refill we will probably get rid of the clonidine.    Intrathecal Drug Delivery System (IDDS)  Pump Device:  Manufacturer: Medtronic Model: Synchromed II Model No.: [id number] Serial No.: [id number] H Delivery Route: Intrathecal Type: Programmable  Volume (mL): 40 mL reservoir Priming Volume: 0.207 mL  Calibration Constant: 117.0  MRI compatibility: Conditional   Implant Details:  Date: 10/08/20 (Third Pump) Implanter: Dr. Deetta Perla (Last)  Contact Information: [facility], [place], [place] Last Revision/Replacement: 10/08/2020 Estimated Replacement Date: May 2029  Implant Site: Abdominal Laterality: Right  Catheter: Manufacturer: Medtronic Model:  n/a  Model No.: [id number]  Serial No.: n/a  Implanted Length (cm): 94.1  Catheter Volume (mL): 0.207  Tip Location (Level): T10 Canal Access Site: L2-3  Drug content:  Primary Medication Class: Opioid analgesic Medication: PF-Fentanyl Concentration: 2000 mcg/mL   Secondary Medication Class: Local anesthetic Medication: PF-bupivacaine Concentration: 30 mg/mL  Tertiary Medication Class: Nonopioid analgesic Medication: PF-clonidine Concentration: 10 mcg/mL  Fourth Medication Class: none  Medication: n/a  Concentration: n/a  PTC/PCA parameters:  Mode: Off/Inactive  Programming:  Type: Simple continuous.  Medication, Concentration, Infusion Program, & Delivery Rate: For up-to-date details please see most recent scanned programming printout.     Changes:  Medication Change: None at this point Rate Change: 20% increase  Reported side-effects or adverse reactions: None  reported  Effectiveness: Described as relatively effective,  allowing for increase in activities of daily living (ADL) Clinically meaningful improvement in function (CMIF): Sustained CMIF goals met  Plan: Pump refill today   Pharmacotherapy Assessment   Opioid Analgesic: Intrathecal Hydromorphone (Dilaudid) @ 2.399 mg/d. MME: 9.6 mg/day.   Monitoring: [place] PMP: PDMP reviewed during this encounter.       Pharmacotherapy: No side-effects or adverse reactions reported. Compliance: No problems identified. Effectiveness: Clinically acceptable. Plan: Refer to "POC". UDS:  Summary  Date Value Ref Range Status  11/12/2015 FINAL  Final    Comment:    ==================================================================== TOXASSURE COMP DRUG ANALYSIS,UR ==================================================================== Test                             Result       Flag       Units Drug Present and Declared for Prescription Verification   7-aminoclonazepam              82           EXPECTED   ng/mg creat    7-aminoclonazepam is an expected metabolite of clonazepam. Source    of clonazepam is a scheduled prescription medication.   Trazodone                      PRESENT      EXPECTED   1,3 chlorophenyl piperazine    PRESENT      EXPECTED    1,3-chlorophenyl piperazine is an expected metabolite of    trazodone.   Verapamil                      PRESENT      EXPECTED Drug Present not Declared for Prescription Verification   Hydromorphone                  1656         UNEXPECTED ng/mg creat    Hydromorphone may be administered as a scheduled prescription    medication; it is also an expected metabolite of hydrocodone. Drug Absent but Declared for Prescription Verification   Venlafaxine                    Not Detected UNEXPECTED ==================================================================== Test                      Result    Flag   Units      Ref Range   Creatinine              73               mg/dL       >=20 ==================================================================== Declared Medications:  The flagging and interpretation on this report are based on the  following declared medications.  Unexpected results may arise from  inaccuracies in the declared medications.  **Note: The testing scope of this panel includes these medications:  Clonazepam (Klonopin)  Trazodone (Desyrel)  Venlafaxine (Effexor)  Verapamil (Calan)  **Note: The testing scope of this panel does not include following  reported medications:  Albuterol  Budenoside (Symbicort)  Chlorthalidone  Formoterol (Symbicort)  Multivitamin  Tamsulosin (Flomax)  Tiotropium (Spiriva)  Ubiquinone (Coenzyme Q 10)  Vitamin D2 (Ergocalciferol) ==================================================================== For clinical consultation, please call [phone]. ====================================================================      Pre-op H&P Assessment:  Mr. Gens is a 71 y.o. (year old), male patient,  seen today for interventional treatment. 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 Mr. Avina has a current medication list  which includes the following prescription(s): acetaminophen, albuterol, albuterol, amlodipine, apixaban, atorvastatin, clonazepam, neuriva plus, magnesium oxide, metformin, methylprednisolone, multivitamin, NON FORMULARY, ondansetron, oxygen-helium, pantoprazole, polyvinyl alcohol, potassium chloride sa, propranolol, tamsulosin, tiotropium bromide-olodaterol, torsemide, trazodone, venlafaxine xr, and naloxone. His primarily concern today is the Back Pain (Lumbar bialteral )  Initial Vital Signs:  Pulse/HCG Rate: 98  Temp: 98.4 F (36.9 C) Resp: 16 BP: (!) 148/85 SpO2: 95 % (3 liters)  BMI: Estimated body mass index is 38.44 kg/m as calculated from the following:   Height as of this encounter: $RemoveBeforeD'5\' 5"'[id number]$  (1.651 m).   Weight as of this encounter: 231 lb (104.8 kg).  Risk Assessment: Allergies: Reviewed. He is allergic to baclofen and hydralazine.  Allergy Precautions: None required Coagulopathies: Reviewed. None identified.  Blood-thinner therapy: None at this time Active Infection(s): Reviewed. None identified. Mr. Mcconathy is afebrile  Site Confirmation: Mr. Vassar was asked to confirm the procedure and laterality before marking the site Procedure checklist: Completed Consent: Before the procedure and under the influence of no sedative(s), amnesic(s), or anxiolytics, the patient was informed of the treatment options, risks and possible complications. To fulfill our ethical and legal obligations, as recommended by the American Medical Association's Code of Ethics, I have informed the patient of my clinical impression; the nature and purpose of the treatment or procedure; the risks, benefits, and possible complications of the intervention; the alternatives, including doing nothing; the risk(s) and benefit(s) of the alternative treatment(s) or procedure(s); and the risk(s) and benefit(s) of doing nothing.  Mr. Gries was provided with information about the general risks and possible complications  associated with most interventional procedures. These include, but are not limited to: failure to achieve desired goals, infection, bleeding, organ or nerve damage, allergic reactions, paralysis, and/or death.  In addition, he was informed of those risks and possible complications associated to this particular procedure, which include, but are not limited to: damage to the implant; failure to decrease pain; local, systemic, or serious CNS infections, intraspinal abscess with possible cord compression and paralysis, or life-threatening such as meningitis; bleeding; organ damage; nerve injury or damage with subsequent sensory, motor, and/or autonomic system dysfunction, resulting in transient or permanent pain, numbness, and/or weakness of one or several areas of the body; allergic reactions, either minor or major life-threatening, such as anaphylactic or anaphylactoid reactions.  Furthermore, Mr. Zeek was informed of those risks and complications associated with the medications. These include, but are not limited to: allergic reactions (i.e.: anaphylactic or anaphylactoid reactions); endorphine suppression; bradycardia and/or hypotension; water retention and/or peripheral vascular relaxation leading to lower extremity edema and possible stasis ulcers; respiratory depression and/or shortness of breath; decreased metabolic rate leading to weight gain; swelling or edema; medication-induced neural toxicity; particulate matter embolism and blood vessel occlusion with resultant organ, and/or nervous  system infarction; and/or intrathecal granuloma formation with possible spinal cord compression and permanent paralysis.  Before refilling the pump Mr. Aust was informed that some of the medications used in the devise may not be FDA approved for such use and therefore it constitutes an off-label use of the medications.  Finally, he was informed that Medicine is not an exact science; therefore, there is also the  possibility of unforeseen or unpredictable risks and/or possible complications that may result in a catastrophic outcome. The patient indicated having understood very clearly. We have given the patient no guarantees and we have made no promises. Enough time was given to the patient to ask questions, all of which were answered to the patient's satisfaction. Mr. Berkowitz has indicated that he wanted to continue with the procedure. Attestation: I, the ordering provider, attest that I have discussed with the patient the benefits, risks, side-effects, alternatives, likelihood of achieving goals, and potential problems during recovery for the procedure that I have provided informed consent. Date  Time: 08/14/2021 12:33 PM  Pre-Procedure Preparation:  Monitoring: As per clinic protocol. Respiration, ETCO2, SpO2, BP, heart rate and rhythm monitor placed and checked for adequate function Safety Precautions: Patient was assessed for positional comfort and pressure points before starting the procedure. Time-out: I initiated and conducted the "Time-out" before starting the procedure, as per protocol. The patient was asked to participate by confirming the accuracy of the "Time Out" information. Verification of the correct person, site, and procedure were performed and confirmed by me, the nursing staff, and the patient. "Time-out" conducted as per Joint Commission's Universal Protocol (UP.01.01.01). Time:    Description of Procedure:          Position: Supine Target Area: Central-port of intrathecal pump. Approach: Anterior, 90 degree angle approach. Area Prepped: Entire Area around the pump implant. DuraPrep (Iodine Povacrylex [0.7% available iodine] and Isopropyl Alcohol, 74% w/w) Safety Precautions: Aspiration looking for blood return was conducted prior to all injections. At no point did we inject any substances, as a needle was being advanced. No attempts were made at seeking any paresthesias. Safe injection  practices and needle disposal techniques used. Medications properly checked for expiration dates. SDV (single dose vial) medications used. Description of the Procedure: Protocol guidelines were followed. Two nurses trained to do implant refills were present during the entire procedure. The refill medication was checked by both healthcare providers as well as the patient. The patient was included in the "Time-out" to verify the medication. The patient was placed in position. The pump was identified. The area was prepped in the usual manner. The sterile template was positioned over the pump, making sure the side-port location matched that of the pump. Both, the pump and the template were held for stability. The needle provided in the Medtronic Kit was then introduced thru the center of the template and into the central port. The pump content was aspirated and discarded volume documented. The new medication was slowly infused into the pump, thru the filter, making sure to avoid overpressure of the device. The needle was then removed and the area cleansed, making sure to leave some of the prepping solution back to take advantage of its long term bactericidal properties. The pump was interrogated and programmed to reflect the correct medication, volume, and dosage. The program was printed and taken to the physician for approval. Once checked and signed by the physician, a copy was provided to the patient and another scanned into the EMR.  Vitals:   08/14/21 1225  BP: (!) 148/85  Pulse: 98  Resp: 16  Temp: 98.4 F (36.9 C)  TempSrc: Temporal  SpO2: 95%  Weight: 231 lb (104.8 kg)  Height: _0  (1.651 m)    Start Time:   hrs. End Time:   hrs. Materials & Medications: Medtronic Refill Kit Medication(s): Please see chart orders for details.  Imaging Guidance:          Type of Imaging Technique: None used Indication(s): N/A Exposure Time: No patient exposure Contrast: None used. Fluoroscopic Guidance:  N/A Ultrasound Guidance: N/A Interpretation: N/A  Antibiotic Prophylaxis:   Anti-infectives (From admission, onward)    None      Indication(s): None identified  Post-operative Assessment:  Post-procedure Vital Signs:  Pulse/HCG Rate: 98  Temp: 98.4 F (36.9 C) Resp: 16 BP: (!) 148/85 SpO2: 95 % (3 liters)  EBL: None  Complications: No immediate post-treatment complications observed by team, or reported by patient.  Note: The patient tolerated the entire procedure well. A repeat set of vitals were taken after the procedure and the patient was kept under observation following institutional policy, for this type of procedure. Post-procedural neurological assessment was performed, showing return to baseline, prior to discharge. The patient was provided with post-procedure discharge instructions, including a section on how to identify potential problems. Should any problems arise concerning this procedure, the patient was given instructions to immediately contact us, at any time, without hesitation. In any case, we plan to contact the patient by telephone for a follow-up status report regarding this interventional procedure.  Comments:  No additional relevant information.  Plan of Care  Orders:  Orders Placed This Encounter  Procedures   PUMP REPROGRAM    Follow programming protocol by having two(2) healthcare providers present during programming.    Scheduling Instructions:     Please perform the following adjustment: Increase rate by 20%.    Order Specific Question:   Where will this procedure be performed?    Answer:   [facility]   PUMP REFILL    Whenever possible schedule on a procedure today.    Standing Status:   Future    Standing Expiration Date:   12/14/2021    Scheduling Instructions:     Please schedule intrathecal pump refill based on pump programming. Avoid schedule intervals of more than 120 days (4 months).    Order Specific Question:   Where will  this procedure be performed?    Answer:   [facility]   Chronic Opioid Analgesic:  Intrathecal Hydromorphone (Dilaudid) @ 2.399 mg/d. MME: 9.6 mg/day.   Medications ordered for procedure: No orders of the defined types were placed in this encounter.  Medications administered: Mayjor Ager had no medications administered during this visit.  See the medical record for exact dosing, route, and time of administration.  Follow-up plan:   Return for scheduled encounter, Pump Refill (Max:48mo.       Interventional Therapies  Risk  Complexity Considerations:   Estimated body mass index is 40.77 kg/m as calculated from the following:   Height as of this encounter: _1  (1.651 m).   Weight as of this encounter: 245 lb (111.1 kg). NOTE: ELIQUIS ANTICOAGULATION (Stop: 3days  Re-start: 6 hrs)   Planned  Pending:   (08/06/2021) medications and concentrations in the intrathecal pump change today as well as the infusion rate.  Patient instructed to give [place] call if he experiences any problems and also to call for increases or decreases, depending on his  needs.   Under consideration:   Continue management of intrathecal pump  Diagnostic bilateral lumbar facet blocks #1    Completed:   None at this time   Therapeutic  Palliative (PRN) options:   Continue management of intrathecal pump       Recent Visits Date Type Provider Dept  08/13/21 Procedure visit Milinda Pointer, MD [facility]  08/06/21 Procedure visit Milinda Pointer, MD [facility]  06/27/21 Procedure visit Milinda Pointer, MD [facility]  Showing recent visits within past 90 days and meeting all other requirements Today's Visits Date Type Provider Dept  08/14/21 Procedure visit Milinda Pointer, MD [facility]  Showing today's visits and meeting all other requirements Future Appointments Date Type Provider Dept  08/15/21 Appointment Milinda Pointer, MD  [facility]  Showing future appointments within next 90 days and meeting all other requirements  Disposition: Discharge home  Discharge (Date  Time): 08/14/2021;   hrs.   Primary Care Physician: [place] Location: [facility] Note by: Gaspar Cola, MD Date: 08/14/2021; Time: 12:47 PM  Disclaimer:  Medicine is not an Chief Strategy Officer. The only guarantee in medicine is that nothing is guaranteed. It is important to note that the decision to proceed with this intervention was based on the information collected from the patient. The Data and conclusions were drawn from the patient's questionnaire, the interview, and the physical examination. Because the information was provided in large part by the patient, it cannot be guaranteed that it has not been purposely or unconsciously manipulated. Every effort has been made to obtain as much relevant data as possible for this evaluation. It is important to note that the conclusions that lead to this procedure are derived in large part from the available data. Always take into account that the treatment will also be dependent on availability of resources and existing treatment guidelines, considered by other Pain Management Practitioners as being common knowledge and practice, at the time of the intervention. For Medico-Legal purposes, it is also important to point out that variation in procedural techniques and pharmacological choices are the acceptable norm. The indications, contraindications, technique, and results of the above procedure should only be interpreted and judged by a Board-Certified Interventional Pain Specialist with extensive familiarity and expertise in the same exact procedure and technique.

## 2021-08-15 ENCOUNTER — Ambulatory Visit: Payer: Worker's Compensation | Attending: Pain Medicine | Admitting: Pain Medicine

## 2021-08-15 ENCOUNTER — Encounter: Payer: Self-pay | Admitting: Pain Medicine

## 2021-08-15 VITALS — BP 133/85 | HR 107 | Temp 98.2°F | Ht 65.0 in | Wt 231.0 lb

## 2021-08-15 DIAGNOSIS — Z5189 Encounter for other specified aftercare: Secondary | ICD-10-CM | POA: Diagnosis present

## 2021-08-15 DIAGNOSIS — M961 Postlaminectomy syndrome, not elsewhere classified: Secondary | ICD-10-CM | POA: Insufficient documentation

## 2021-08-15 DIAGNOSIS — M5441 Lumbago with sciatica, right side: Secondary | ICD-10-CM | POA: Insufficient documentation

## 2021-08-15 DIAGNOSIS — M79604 Pain in right leg: Secondary | ICD-10-CM | POA: Diagnosis present

## 2021-08-15 DIAGNOSIS — M4326 Fusion of spine, lumbar region: Secondary | ICD-10-CM | POA: Insufficient documentation

## 2021-08-15 DIAGNOSIS — Z978 Presence of other specified devices: Secondary | ICD-10-CM | POA: Insufficient documentation

## 2021-08-15 DIAGNOSIS — Z79899 Other long term (current) drug therapy: Secondary | ICD-10-CM | POA: Insufficient documentation

## 2021-08-15 DIAGNOSIS — G894 Chronic pain syndrome: Secondary | ICD-10-CM

## 2021-08-15 DIAGNOSIS — G8929 Other chronic pain: Secondary | ICD-10-CM | POA: Insufficient documentation

## 2021-08-15 DIAGNOSIS — M5137 Other intervertebral disc degeneration, lumbosacral region: Secondary | ICD-10-CM | POA: Diagnosis present

## 2021-08-15 DIAGNOSIS — Z451 Encounter for adjustment and management of infusion pump: Secondary | ICD-10-CM | POA: Diagnosis present

## 2021-08-15 NOTE — Patient Instructions (Signed)

## 2021-08-15 NOTE — Progress Notes (Signed)
PROVIDER NOTE: Interpretation of information contained herein should be left to medically-trained personnel. Specific patient instructions are provided elsewhere under "Patient Instructions" section of medical record. This document was created in part using STT-dictation technology, any transcriptional errors that may result from this process are unintentional.  Patient: Brian Spencer Type: Established DOB: 1950-09-28 MRN: [id number] P[facility]: [facility]  Service: Procedure DOS: 08/15/2021 Setting: Ambulatory Location: Ambulatory outpatient facility Delivery: Face-to-face Provider: Gaspar Cola, MD Specialty: Interventional Pain Management Specialty designation: 09 Location: Outpatient facility Ref. Prov.: Mullens    Primary Reason for Visit: Interventional Pain Management Treatment. CC: Back Pain (lower)  Procedure:          Type: Management of Intrathecal Drug Delivery System (IDDS) - Analysis & Programming [phone]). 20% rate increase.  Indications: 1. Chronic pain syndrome   2. Chronic low back pain (1ry area of Pain) (Bilateral) (R>L)   3. Chronic lower extremity pain (2ry area of Pain) (Right)   4. Failed back surgical syndrome   5. DDD (degenerative disc disease), lumbosacral   6. Fusion of lumbar spine   7. Presence of intrathecal pump   8. Encounter for adjustment or management of infusion pump   9. Encounter for therapeutic procedure   10. Pharmacologic therapy   11. Encounter for medication management   12. Encounter for chronic pain management    Pain Assessment: Self-Reported Pain Score: 10-Worst pain ever/10             Reported level is compatible with observation.        Brian Spencer returns to the clinic today indicating that the pump increase that we did yesterday not only did not help, but he refers that his pain is worse.  I asked him if he was absolutely sure about this since this would be unexpected.  He indicated that he was sure.  This brings up  the question as to whether or not he may be having an intrathecal granuloma.  In order to determine if that is the case, today we will give him a 10 mcg fentanyl bolus over 1 minute for the purpose of pushing medicine in an challenging whether or not he has an intrathecal granuloma.  I have explained to the patient that should he experience a sudden increase in the pain while the bolus is going in, this would mean that there is an obstruction to the flow meaning that it is likely that he may have a granuloma.  The bolus was administered and the patient did not experience any type of discomfort upon receiving the bolus meaning that it is not likely that there is an obstruction to the flow of medicine out of the catheter as we would see with an intrathecal granuloma.  For this reason today we have programmed to an additional 20% increase in his dose and we will continue to do that until he gets comfortable.  He later commented that perhaps the increase in the pain that he was experiencing was due to the fact that he was having an antalgic gait and he was not walking straight and perhaps that contributed to it.    Intrathecal Drug Delivery System (IDDS)  Pump Device:  Manufacturer: Medtronic Model: Synchromed II Model No.: [id number] Serial No.: [id number] H Delivery Route: Intrathecal Type: Programmable  Volume (mL): 40 mL reservoir Priming Volume: 0.207 mL  Calibration Constant: 117.0  MRI compatibility: Conditional   Implant Details:  Date: 10/08/20 (Third Pump) Implanter: Dr. Deetta Perla (Last)  Contact  Information: [facility], [place], [place] Last Revision/Replacement: 10/08/2020 Estimated Replacement Date: May 2029  Implant Site: Abdominal Laterality: Right  Catheter: Manufacturer: Medtronic Model:  n/a  Model No.: [id number]  Serial No.: n/a  Implanted Length (cm): 94.1  Catheter Volume (mL): 0.207  Tip Location (Level): T10 Canal Access Site: L2-3  Drug content:   Primary Medication Class: Opioid analgesic Medication: PF-Fentanyl Concentration: 2000 mcg/mL   Secondary Medication Class: Local anesthetic Medication: PF-bupivacaine Concentration: 30 mg/mL  Tertiary Medication Class: Nonopioid analgesic Medication: PF-clonidine Concentration: 10 mcg/mL  Fourth Medication Class: none  Medication: n/a  Concentration: n/a  PTC/PCA parameters:  Mode: Off/Inactive  Programming:  Type: Simple continuous.  Medication, Concentration, Infusion Program, & Delivery Rate: For up-to-date details please see most recent scanned programming printout.      Changes:  Medication Change: None at this point Rate Change: 20% increase after a test bolus of 10 mcg of fentanyl.  Reported side-effects or adverse reactions: None reported  Effectiveness: Described as relatively effective, allowing for increase in activities of daily living (ADL) Clinically meaningful improvement in function (CMIF): Sustained CMIF goals met  Plan: Pump refill today   Pharmacotherapy Assessment   Opioid Analgesic: Intrathecal Hydromorphone (Dilaudid) @ 2.399 mg/d. MME: 9.6 mg/day.   Monitoring: [place] PMP: PDMP not reviewed this encounter.       Pharmacotherapy: No side-effects or adverse reactions reported. Compliance: No problems identified. Effectiveness: Clinically acceptable. Plan: Refer to "POC". UDS:  Summary  Date Value Ref Range Status  11/12/2015 FINAL  Final    Comment:    ==================================================================== TOXASSURE COMP DRUG ANALYSIS,UR ==================================================================== Test                             Result       Flag       Units Drug Present and Declared for Prescription Verification   7-aminoclonazepam              82           EXPECTED   ng/mg creat    7-aminoclonazepam is an expected metabolite of clonazepam. Source    of clonazepam is a scheduled prescription medication.   Trazodone                       PRESENT      EXPECTED   1,3 chlorophenyl piperazine    PRESENT      EXPECTED    1,3-chlorophenyl piperazine is an expected metabolite of    trazodone.   Verapamil                      PRESENT      EXPECTED Drug Present not Declared for Prescription Verification   Hydromorphone                  1656         UNEXPECTED ng/mg creat    Hydromorphone may be administered as a scheduled prescription    medication; it is also an expected metabolite of hydrocodone. Drug Absent but Declared for Prescription Verification   Venlafaxine                    Not Detected UNEXPECTED ==================================================================== Test                      Result    Flag   Units  Ref Range   Creatinine              73               mg/dL      >=20 ==================================================================== Declared Medications:  The flagging and interpretation on this report are based on the  following declared medications.  Unexpected results may arise from  inaccuracies in the declared medications.  **Note: The testing scope of this panel includes these medications:  Clonazepam (Klonopin)  Trazodone (Desyrel)  Venlafaxine (Effexor)  Verapamil (Calan)  **Note: The testing scope of this panel does not include following  reported medications:  Albuterol  Budenoside (Symbicort)  Chlorthalidone  Formoterol (Symbicort)  Multivitamin  Tamsulosin (Flomax)  Tiotropium (Spiriva)  Ubiquinone (Coenzyme Q 10)  Vitamin D2 (Ergocalciferol) ==================================================================== For clinical consultation, please call [phone]. ====================================================================      Pre-op H&P Assessment:  Mr. Parlato is a 71 y.o. (year old), male patient, seen today for interventional treatment. 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 Mr. Marquis has a current medication list which includes the following prescription(s): acetaminophen, albuterol, albuterol, amlodipine, apixaban, atorvastatin, clonazepam, neuriva plus, magnesium oxide, metformin, methylprednisolone, multivitamin, NON FORMULARY, ondansetron, oxygen-helium, pantoprazole, polyvinyl alcohol, potassium chloride sa, propranolol, tamsulosin, tiotropium bromide-olodaterol, torsemide, trazodone, venlafaxine xr, and naloxone. His primarily concern today is the Back Pain (lower)  Initial Vital Signs:  Pulse/HCG Rate: (!) 107  Temp: 98.2 F (36.8 C) Resp:   BP: 133/85 SpO2: 99 % (3)  BMI: Estimated body mass index is 38.44 kg/m as calculated from the following:   Height as of this encounter: _0  (1.651 m).   Weight as of this encounter: 231 lb (104.8 kg).  Risk Assessment: Allergies: Reviewed. He is allergic to baclofen and hydralazine.  Allergy Precautions: None required Coagulopathies: Reviewed. None identified.  Blood-thinner therapy: None at this time Active Infection(s): Reviewed. None identified. Mr. Eads is afebrile  Site Confirmation: Mr. Lamagna was asked to confirm the procedure and laterality before marking the  site Procedure checklist: Completed Consent: Before the procedure and under the influence of no sedative(s), amnesic(s), or anxiolytics, the patient was informed of the treatment options, risks and possible complications. To fulfill our ethical and legal obligations, as recommended by the American Medical Association's Code of Ethics, I have informed the patient of my clinical impression; the nature and purpose of the treatment or procedure; the risks, benefits, and possible complications of the intervention; the alternatives, including doing nothing; the risk(s) and benefit(s) of the alternative treatment(s) or procedure(s); and the risk(s) and benefit(s) of doing nothing.  Mr. Train was provided with information about the general risks and possible complications associated with most interventional procedures. These include, but are not limited to: failure to achieve desired goals, infection, bleeding, organ or nerve damage, allergic reactions, paralysis, and/or death.  In addition, he was informed of those risks and possible complications associated to  this particular procedure, which include, but are not limited to: damage to the implant; failure to decrease pain; local, systemic, or serious CNS infections, intraspinal abscess with possible cord compression and paralysis, or life-threatening such as meningitis; bleeding; organ damage; nerve injury or damage with subsequent sensory, motor, and/or autonomic system dysfunction, resulting in transient or permanent pain, numbness, and/or weakness of one or several areas of the body; allergic reactions, either minor or major life-threatening, such as anaphylactic or anaphylactoid reactions.  Furthermore, Mr. Stucke was informed of those risks and complications associated with the medications. These include, but are not limited to: allergic reactions (i.e.: anaphylactic or anaphylactoid reactions); endorphine suppression; bradycardia and/or hypotension; water  retention and/or peripheral vascular relaxation leading to lower extremity edema and possible stasis ulcers; respiratory depression and/or shortness of breath; decreased metabolic rate leading to weight gain; swelling or edema; medication-induced neural toxicity; particulate matter embolism and blood vessel occlusion with resultant organ, and/or nervous system infarction; and/or intrathecal granuloma formation with possible spinal cord compression and permanent paralysis.  Before refilling the pump Mr. Violett was informed that some of the medications used in the devise may not be FDA approved for such use and therefore it constitutes an off-label use of the medications.  Finally, he was informed that Medicine is not an exact science; therefore, there is also the possibility of unforeseen or unpredictable risks and/or possible complications that may result in a catastrophic outcome. The patient indicated having understood very clearly. We have given the patient no guarantees and we have made no promises. Enough time was given to the patient to ask questions, all of which were answered to the patient's satisfaction. Mr. Arnall has indicated that he wanted to continue with the procedure. Attestation: I, the ordering provider, attest that I have discussed with the patient the benefits, risks, side-effects, alternatives, likelihood of achieving goals, and potential problems during recovery for the procedure that I have provided informed consent. Date  Time: 08/15/2021 12:29 PM  Pre-Procedure Preparation:  Monitoring: As per clinic protocol. Respiration, ETCO2, SpO2, BP, heart rate and rhythm monitor placed and checked for adequate function Safety Precautions: Patient was assessed for positional comfort and pressure points before starting the procedure. Time-out: I initiated and conducted the "Time-out" before starting the procedure, as per protocol. The patient was asked to participate by confirming the accuracy  of the "Time Out" information. Verification of the correct person, site, and procedure were performed and confirmed by me, the nursing staff, and the patient. "Time-out" conducted as per Joint Commission's Universal Protocol (UP.01.01.01). Time:    Description of Procedure:          Position: Supine Target Area: Central-port of intrathecal pump. Approach: Anterior, 90 degree angle approach. Area Prepped: Entire Area around the pump implant. DuraPrep (Iodine Povacrylex [0.7% available iodine] and Isopropyl Alcohol, 74% w/w) Safety Precautions: Aspiration looking for blood return was conducted prior to all injections. At no point did we inject any substances, as a needle was being advanced. No attempts were made at seeking any paresthesias. Safe injection practices and needle disposal techniques used. Medications properly checked for expiration dates. SDV (single dose vial) medications used. Description of the Procedure: Protocol guidelines were followed. Two nurses trained to do implant refills were present during the entire procedure. The refill medication was checked by both healthcare providers as well as the patient. The patient was included in the "Time-out" to verify the medication. The patient was placed in position. The pump was identified. The area was  prepped in the usual manner. The sterile template was positioned over the pump, making sure the side-port location matched that of the pump. Both, the pump and the template were held for stability. The needle provided in the Medtronic Kit was then introduced thru the center of the template and into the central port. The pump content was aspirated and discarded volume documented. The new medication was slowly infused into the pump, thru the filter, making sure to avoid overpressure of the device. The needle was then removed and the area cleansed, making sure to leave some of the prepping solution back to take advantage of its long term bactericidal  properties. The pump was interrogated and programmed to reflect the correct medication, volume, and dosage. The program was printed and taken to the physician for approval. Once checked and signed by the physician, a copy was provided to the patient and another scanned into the EMR.  Vitals:   08/15/21 1225  BP: 133/85  Pulse: (!) 107  Temp: 98.2 F (36.8 C)  SpO2: 99%  Weight: 231 lb (104.8 kg)  Height: _0  (1.651 m)    Start Time:   hrs. End Time:   hrs. Materials & Medications: Medtronic Refill Kit Medication(s): Please see chart orders for details.  Imaging Guidance:          Type of Imaging Technique: None used Indication(s): N/A Exposure Time: No patient exposure Contrast: None used. Fluoroscopic Guidance: N/A Ultrasound Guidance: N/A Interpretation: N/A  Antibiotic Prophylaxis:   Anti-infectives (From admission, onward)    None      Indication(s): None identified  Post-operative Assessment:  Post-procedure Vital Signs:  Pulse/HCG Rate: (!) 107  Temp: 98.2 F (36.8 C) Resp:   BP: 133/85 SpO2: 99 % (3)  EBL: None  Complications: No immediate post-treatment complications observed by team, or reported by patient.  Note: The patient tolerated the entire procedure well. A repeat set of vitals were taken after the procedure and the patient was kept under observation following institutional policy, for this type of procedure. Post-procedural neurological assessment was performed, showing return to baseline, prior to discharge. The patient was provided with post-procedure discharge instructions, including a section on how to identify potential problems. Should any problems arise concerning this procedure, the patient was given instructions to immediately contact us, at any time, without hesitation. In any case, we plan to contact the patient by telephone for a follow-up status report regarding this interventional procedure.  Comments:  No additional relevant  information.  Plan of Care  Orders:  Orders Placed This Encounter  Procedures   PUMP REPROGRAM    Follow programming protocol by having two(2) healthcare providers present during programming.    Scheduling Instructions:     Please perform the following adjustment: Increase rate by 20%.    Order Specific Question:   Where will this procedure be performed?    Answer:   [facility]   PUMP REFILL    Whenever possible schedule on a procedure today.    Standing Status:   Future    Standing Expiration Date:   12/15/2021    Scheduling Instructions:     Please schedule intrathecal pump refill based on pump programming. Avoid schedule intervals of more than 120 days (4 months).    Order Specific Question:   Where will this procedure be performed?    Answer:   [facility]   Chronic Opioid Analgesic:  Intrathecal Hydromorphone (Dilaudid) @ 2.399 mg/d. MME: 9.6 mg/day.  Medications ordered for procedure: No orders of the defined types were placed in this encounter.  Medications administered: Ulrich Soules had no medications administered during this visit.  See the medical record for exact dosing, route, and time of administration.  Follow-up plan:   Return for Pump Refill (Max:70mo.       Interventional Therapies  Risk  Complexity Considerations:   Estimated body mass index is 40.77 kg/m as calculated from the following:   Height as of this encounter: _0  (1.651 m).   Weight as of this encounter: 245 lb (111.1 kg). NOTE: ELIQUIS ANTICOAGULATION (Stop: 3days  Re-start: 6 hrs)   Planned  Pending:   (08/06/2021) medications and concentrations in the intrathecal pump change today as well as the infusion rate.  Patient instructed to give [place] call if he experiences any problems and also to call for increases or decreases, depending on his needs.   Under consideration:   Continue management of intrathecal pump  Diagnostic bilateral lumbar facet blocks #1    Completed:    None at this time   Therapeutic  Palliative (PRN) options:   Continue management of intrathecal pump        Recent Visits Date Type Provider Dept  08/14/21 Procedure visit NMilinda Pointer MD [facility]  08/13/21 Procedure visit NMilinda Pointer MD [facility]  08/06/21 Procedure visit NMilinda Pointer MD [facility]  06/27/21 Procedure visit NMilinda Pointer MD [facility]  Showing recent visits within past 90 days and meeting all other requirements Today's Visits Date Type Provider Dept  08/15/21 Procedure visit NMilinda Pointer MD [facility]  Showing today's visits and meeting all other requirements Future Appointments Date Type Provider Dept  08/21/21 Appointment NMilinda Pointer MD [facility]  Showing future appointments within next 90 days and meeting all other requirements  Disposition: Discharge home  Discharge (Date  Time): 08/15/2021; 1302 hrs.   [place] Care Physician[place] Note by: FGaspar Cola MD Date: 08/15/2021; Time: 1:16 PM  Disclaimer:  Medicine is not an exact science. The only guarantee in medicine is that nothing is guaranteed. It is important to note that the decision to proceed with this intervention was based on the information collected from the patient. The Data and conclusions were drawn from the patient's questionnaire, the interview, and the physical examination. Because the information was provided in large part by the patient, it cannot be guaranteed that it has not been purposely or unconsciously manipulated. Every effort has been made to obtain as much relevant data as possible for this evaluation. It is important to note that the conclusions that lead to this procedure are derived in large part from the available data. Always take into account that the treatment will also be dependent on  availability of resources and existing treatment guidelines, considered by other Pain Management Practitioners as being common knowledge and practice, at the time of the intervention. For Medico-Legal purposes, it is also important to point out that variation in procedural techniques and pharmacological choices are the acceptable norm. The indications, contraindications, technique, and results of the above procedure should only be interpreted and judged by a Board-Certified Interventional Pain Specialist with extensive familiarity and expertise in the same exact procedure and technique.

## 2021-08-15 NOTE — Progress Notes (Signed)
Safety precautions to be maintained throughout the outpatient stay will include: orient to surroundings, keep bed in low position, maintain call bell within reach at all times, provide assistance with transfer out of bed and ambulation.  

## 2021-08-16 ENCOUNTER — Telehealth: Payer: Self-pay

## 2021-08-16 NOTE — Telephone Encounter (Signed)
Post pump refill follow up.  Patient states he is still feeling a little rough but thinks he is feeling some better.

## 2021-08-19 NOTE — Progress Notes (Unsigned)
PROVIDER NOTE: Interpretation of information contained herein should be left to medically-trained personnel. Specific patient instructions are provided elsewhere under "Patient Instructions" section of medical record. This document was created in part using STT-dictation technology, any transcriptional errors that may result from this process are unintentional.  Patient: Brian Spencer Type: Established DOB: 1950-08-11 MRN: [id number] [facility]: [facility]  Service: Procedure DOS: 08/21/2021 Setting: Ambulatory Location: Ambulatory outpatient facility Delivery: Face-to-face Provider: Gaspar Cola, MD Specialty: Interventional Pain Management Specialty designation: 09 Location: Outpatient facility Ref. Prov.: [place]    Primary Reason for Visit: Interventional Pain Management Treatment. CC: No chief complaint on file.  Procedure:          Type: Management of Intrathecal Drug Delivery System (IDDS) - Analysis & Programming [phone]). 20% rate increase.  Indications: 1. Chronic pain syndrome   2. Chronic low back pain (1ry area of Pain) (Bilateral) (R>L)   3. Chronic lower extremity pain (2ry area of Pain) (Right)   4. Failed back surgical syndrome   5. DDD (degenerative disc disease), lumbosacral   6. Fusion of lumbar spine   7. Presence of intrathecal pump   8. Encounter for adjustment or management of infusion pump   9. Encounter for therapeutic procedure   10. Pharmacologic therapy   11. Encounter for medication management   12. Encounter for chronic pain management    Pain Assessment: Self-Reported Pain Score:  /10             Reported level is compatible with observation.          Intrathecal Drug Delivery System (IDDS)  Pump Device:  Manufacturer: Medtronic Model: Synchromed II Model No.: [id number] Serial No.: [id number] H Delivery Route: Intrathecal Type: Programmable  Volume (mL): 40 mL reservoir Priming Volume: 0.207 mL  Calibration Constant: 117.0  MRI  compatibility: Conditional   Implant Details:  Date: 10/08/20 (Third Pump) Implanter: Dr. Deetta Perla (Last)  Contact Information: [facility], [place], [place] Last Revision/Replacement: 10/08/2020 Estimated Replacement Date: May 2029  Implant Site: Abdominal Laterality: Right  Catheter: Manufacturer: Medtronic Model:  n/a  Model No.: [id number]  Serial No.: n/a  Implanted Length (cm): 94.1  Catheter Volume (mL): 0.207  Tip Location (Level): T10 Canal Access Site: L2-3  Drug content:  Primary Medication Class: Opioid analgesic Medication: PF-Fentanyl Concentration: 2000 mcg/mL   Secondary Medication Class: Local anesthetic Medication: PF-bupivacaine Concentration: 30 mg/mL  Tertiary Medication Class: Nonopioid analgesic Medication: PF-clonidine Concentration: 10 mcg/mL  Fourth Medication Class: none  Medication: n/a  Concentration: n/a  PTC/PCA parameters:  Mode: Off/Inactive  Programming:  Type: Simple continuous.  Medication, Concentration, Infusion Program, & Delivery Rate: For up-to-date details please see most recent scanned programming printout.       Changes:  Medication Change: None at this point Rate Change: No change in rate  Reported side-effects or adverse reactions: None reported  Effectiveness: Described as relatively effective, allowing for increase in activities of daily living (ADL) Clinically meaningful improvement in function (CMIF): Sustained CMIF goals met  Plan: Pump refill today   Pharmacotherapy Assessment   Opioid Analgesic: Intrathecal Hydromorphone (Dilaudid) @ 2.399 mg/d. MME: 9.6 mg/day.   Monitoring: [place] PMP: PDMP reviewed during this encounter.       Pharmacotherapy: No side-effects or adverse reactions reported. Compliance: No problems identified. Effectiveness: Clinically acceptable. Plan: Refer to "POC". UDS:  Summary  Date Value Ref Range Status  11/12/2015 FINAL  Final    Comment:     ==================================================================== TOXASSURE COMP DRUG ANALYSIS,UR ====================================================================  Test                             Result       Flag       Units Drug Present and Declared for Prescription Verification   7-aminoclonazepam              82           EXPECTED   ng/mg creat    7-aminoclonazepam is an expected metabolite of clonazepam. Source    of clonazepam is a scheduled prescription medication.   Trazodone                      PRESENT      EXPECTED   1,3 chlorophenyl piperazine    PRESENT      EXPECTED    1,3-chlorophenyl piperazine is an expected metabolite of    trazodone.   Verapamil                      PRESENT      EXPECTED Drug Present not Declared for Prescription Verification   Hydromorphone                  1656         UNEXPECTED ng/mg creat    Hydromorphone may be administered as a scheduled prescription    medication; it is also an expected metabolite of hydrocodone. Drug Absent but Declared for Prescription Verification   Venlafaxine                    Not Detected UNEXPECTED ==================================================================== Test                      Result    Flag   Units      Ref Range   Creatinine              73               mg/dL      >=20 ==================================================================== Declared Medications:  The flagging and interpretation on this report are based on the  following declared medications.  Unexpected results may arise from  inaccuracies in the declared medications.  **Note: The testing scope of this panel includes these medications:  Clonazepam (Klonopin)  Trazodone (Desyrel)  Venlafaxine (Effexor)  Verapamil (Calan)  **Note: The testing scope of this panel does not include following  reported medications:  Albuterol  Budenoside (Symbicort)  Chlorthalidone  Formoterol (Symbicort)  Multivitamin  Tamsulosin (Flomax)   Tiotropium (Spiriva)  Ubiquinone (Coenzyme Q 10)  Vitamin D2 (Ergocalciferol) ==================================================================== For clinical consultation, please call [phone]. ====================================================================      Pre-op H&P Assessment:  Mr. Koval is a 71 y.o. (year old), male patient, seen today for interventional treatment. 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 Mr. Raybourn has a current medication list which includes the following prescription(s): acetaminophen, albuterol, albuterol, amlodipine, apixaban, atorvastatin, clonazepam, neuriva plus, magnesium oxide, metformin, methylprednisolone, multivitamin, naloxone, NON FORMULARY, ondansetron, oxygen-helium, pantoprazole, polyvinyl alcohol, potassium chloride sa, propranolol, tamsulosin, tiotropium bromide-olodaterol, torsemide, trazodone, and venlafaxine xr. His primarily concern today is the No chief complaint on file.  Initial Vital Signs:  Pulse/HCG Rate:    Temp:   Resp:   BP:   SpO2:    BMI: Estimated body mass index is 38.44 kg/m as calculated from the following:   Height  as of 08/15/21: 5' 5"  (1.651 m).   Weight as of 08/15/21: 231 lb (104.8 kg).  Risk Assessment: Allergies: Reviewed. He is allergic to baclofen and hydralazine.  Allergy Precautions: None required Coagulopathies: Reviewed. None identified.  Blood-thinner therapy: None at this time Active Infection(s): Reviewed. None identified. Mr. Warmuth is afebrile  Site Confirmation: Mr. Hoopes was asked to confirm the procedure and laterality before marking the site Procedure checklist: Completed Consent: Before the procedure and under the influence of no sedative(s), amnesic(s), or anxiolytics, the patient was informed of the treatment options, risks and possible complications. To fulfill our ethical and legal obligations, as recommended by the American Medical Association's Code of Ethics, I have informed the patient of my clinical impression; the nature and purpose of the treatment or procedure; the risks, benefits, and possible complications of the intervention; the alternatives, including doing nothing; the risk(s) and benefit(s) of the alternative treatment(s) or procedure(s); and the risk(s) and benefit(s) of doing nothing.  Mr. Bastin was provided with information about the general risks and possible complications associated with most interventional procedures. These include, but are not limited to: failure to achieve desired goals, infection, bleeding, organ or nerve damage, allergic reactions, paralysis, and/or death.  In addition, he was informed of those risks and possible complications associated to this particular procedure, which include, but are not limited to: damage to the implant; failure to decrease pain; local, systemic, or serious CNS infections, intraspinal abscess with possible cord compression and paralysis, or life-threatening such as meningitis; bleeding; organ damage; nerve injury or damage with subsequent sensory, motor, and/or autonomic system dysfunction, resulting in transient or  permanent pain, numbness, and/or weakness of one or several areas of the body; allergic reactions, either minor or major life-threatening, such as anaphylactic or anaphylactoid reactions.  Furthermore, Mr. Zerby was informed of those risks and complications associated with the medications. These include, but are not limited to: allergic reactions (i.e.: anaphylactic or anaphylactoid reactions); endorphine suppression; bradycardia and/or hypotension; water retention and/or peripheral vascular relaxation leading to lower extremity edema and possible stasis ulcers; respiratory depression and/or shortness of breath; decreased metabolic rate leading to weight gain; swelling or edema; medication-induced neural toxicity; particulate matter embolism and blood vessel occlusion with resultant organ, and/or nervous system infarction; and/or intrathecal granuloma formation with possible spinal cord compression and permanent paralysis.  Before refilling the pump Mr. Spurgeon was informed that some of the medications used in the devise may not be FDA approved for such use and therefore it constitutes an off-label use of the medications.  Finally, he was informed that Medicine is not an exact science; therefore, there is also the possibility of unforeseen or unpredictable risks and/or possible complications that may result in a catastrophic outcome. The patient indicated having understood very clearly. We have given the patient no guarantees and we have made no promises. Enough time was given to the patient to ask questions, all  of which were answered to the patient's satisfaction. Mr. Waring has indicated that he wanted to continue with the procedure. Attestation: I, the ordering provider, attest that I have discussed with the patient the benefits, risks, side-effects, alternatives, likelihood of achieving goals, and potential problems during recovery for the procedure that I have provided informed consent. Date  Time: {CHL  [facility]-PAIN TIME CHOICES:[id number]}  Pre-Procedure Preparation:  Monitoring: As per clinic protocol. Respiration, ETCO2, SpO2, BP, heart rate and rhythm monitor placed and checked for adequate function Safety Precautions: Patient was assessed for positional comfort and pressure points before starting the procedure. Time-out: I initiated and conducted the "Time-out" before starting the procedure, as per protocol. The patient was asked to participate by confirming the accuracy of the "Time Out" information. Verification of the correct person, site, and procedure were performed and confirmed by me, the nursing staff, and the patient. "Time-out" conducted as per Joint Commission's Universal Protocol (UP.01.01.01). Time:    Description of Procedure:          Position: Supine Target Area: Central-port of intrathecal pump. Approach: Anterior, 90 degree angle approach. Area Prepped: Entire Area around the pump implant. DuraPrep (Iodine Povacrylex [0.7% available iodine] and Isopropyl Alcohol, 74% w/w) Safety Precautions: Aspiration looking for blood return was conducted prior to all injections. At no point did we inject any substances, as a needle was being advanced. No attempts were made at seeking any paresthesias. Safe injection practices and needle disposal techniques used. Medications properly checked for expiration dates. SDV (single dose vial) medications used. Description of the Procedure: Protocol guidelines were followed. Two nurses trained to do implant refills were present during the entire procedure. The refill medication was checked by both healthcare providers as well as the patient. The patient was included in the "Time-out" to verify the medication. The patient was placed in position. The pump was identified. The area was prepped in the usual manner. The sterile template was positioned over the pump, making sure the side-port location matched that of the pump. Both, the pump and the template  were held for stability. The needle provided in the Medtronic Kit was then introduced thru the center of the template and into the central port. The pump content was aspirated and discarded volume documented. The new medication was slowly infused into the pump, thru the filter, making sure to avoid overpressure of the device. The needle was then removed and the area cleansed, making sure to leave some of the prepping solution back to take advantage of its long term bactericidal properties. The pump was interrogated and programmed to reflect the correct medication, volume, and dosage. The program was printed and taken to the physician for approval. Once checked and signed by the physician, a copy was provided to the patient and another scanned into the EMR.  There were no vitals filed for this visit.  Start Time:   hrs. End Time:   hrs. Materials & Medications: Medtronic Refill Kit Medication(s): Please see chart orders for details.  Imaging Guidance:          Type of Imaging Technique: None used Indication(s): N/A Exposure Time: No patient exposure Contrast: None used. Fluoroscopic Guidance: N/A Ultrasound Guidance: N/A Interpretation: N/A  Antibiotic Prophylaxis:   Anti-infectives (From admission, onward)    None      Indication(s): None identified  Post-operative Assessment:  Post-procedure Vital Signs:  Pulse/HCG Rate:    Temp:   Resp:   BP:   SpO2:    EBL: None  Complications: No immediate post-treatment complications observed by team, or reported by patient.  Note: The patient tolerated the entire procedure well. A repeat set of vitals were taken after the procedure and the patient was kept under observation following institutional policy, for this type of procedure. Post-procedural neurological assessment was performed, showing return to baseline, prior to discharge. The patient was provided with post-procedure discharge instructions, including a section on how to  identify potential problems. Should any problems arise concerning this procedure, the patient was given instructions to immediately contact us, at any time, without hesitation. In any case, we plan to contact the patient by telephone for a follow-up status report regarding this interventional procedure.  Comments:  No additional relevant information.  Plan of Care  Orders:  No orders of the defined types were placed in this encounter.  Chronic Opioid Analgesic:  Intrathecal Hydromorphone (Dilaudid) @ 2.399 mg/d. MME: 9.6 mg/day.   Medications ordered for procedure: No orders of the defined types were placed in this encounter.  Medications administered: Jamaris Theard had no medications administered during this visit.  See the medical record for exact dosing, route, and time of administration.  Follow-up plan:   No follow-ups on file.       Interventional Therapies  Risk  Complexity Considerations:   Estimated body mass index is 40.77 kg/m as calculated from the following:   Height as of this encounter: 5' 5"  (1.651 m).   Weight as of this encounter: 245 lb (111.1 kg). NOTE: ELIQUIS ANTICOAGULATION (Stop: 3days  Re-start: 6 hrs)   Planned  Pending:   (08/06/2021) medications and concentrations in the intrathecal pump change today as well as the infusion rate.  Patient instructed to give [place] a call if he experiences any problems and also to call for increases or decreases, depending on his needs.   Under consideration:   Continue management of intrathecal pump  Diagnostic bilateral lumbar facet blocks #1    Completed:   None at this time   Therapeutic  Palliative (PRN) options:   Continue management of intrathecal pump         Recent Visits Date Type Provider Dept  08/15/21 Procedure visit Milinda Pointer, MD [facility]  08/14/21 Procedure visit Milinda Pointer, MD [facility]  08/13/21 Procedure visit Milinda Pointer, MD [facility]   08/06/21 Procedure visit Milinda Pointer, MD [facility]  06/27/21 Procedure visit Milinda Pointer, MD [facility]  Showing recent visits within past 90 days and meeting all other requirements Future Appointments Date Type Provider Dept  08/21/21 Appointment Milinda Pointer, MD [facility]  Showing future appointments within next 90 days and meeting all other requirements  Disposition: Discharge home  Discharge (Date  Time): 08/21/2021;   hrs.   Primary Care Physician: [place] Location: [facility] Note by: Gaspar Cola, MD Date: 08/21/2021; Time: 6:59 AM  Disclaimer:  Medicine is not an Chief Strategy Officer. The only guarantee in medicine is that nothing is guaranteed. It is important to note that the decision to proceed with this intervention was based on the information collected from the patient. The Data and conclusions were drawn from the patient's questionnaire, the interview, and the physical examination. Because the information was provided in large part by the patient, it cannot be guaranteed that it has not been purposely or unconsciously manipulated. Every effort has been made to obtain as much relevant data as possible for this evaluation. It is important to  note that the conclusions that lead to this procedure are derived in large part from the available data. Always take into account that the treatment will also be dependent on availability of resources and existing treatment guidelines, considered by other Pain Management Practitioners as being common knowledge and practice, at the time of the intervention. For Medico-Legal purposes, it is also important to point out that variation in procedural techniques and pharmacological choices are the acceptable norm. The indications, contraindications, technique, and results of the above procedure should only be interpreted and judged by a Board-Certified  Interventional Pain Specialist with extensive familiarity and expertise in the same exact procedure and technique.

## 2021-08-21 ENCOUNTER — Encounter: Payer: Self-pay | Admitting: Pain Medicine

## 2021-08-21 ENCOUNTER — Ambulatory Visit: Payer: Worker's Compensation | Attending: Pain Medicine | Admitting: Pain Medicine

## 2021-08-21 VITALS — BP 125/69 | HR 102 | Temp 98.1°F | Resp 16 | Ht 65.0 in | Wt 230.0 lb

## 2021-08-21 DIAGNOSIS — M79604 Pain in right leg: Secondary | ICD-10-CM | POA: Insufficient documentation

## 2021-08-21 DIAGNOSIS — G8929 Other chronic pain: Secondary | ICD-10-CM | POA: Diagnosis present

## 2021-08-21 DIAGNOSIS — G894 Chronic pain syndrome: Secondary | ICD-10-CM | POA: Diagnosis not present

## 2021-08-21 DIAGNOSIS — Z79899 Other long term (current) drug therapy: Secondary | ICD-10-CM | POA: Diagnosis present

## 2021-08-21 DIAGNOSIS — Z451 Encounter for adjustment and management of infusion pump: Secondary | ICD-10-CM

## 2021-08-21 DIAGNOSIS — M51379 Other intervertebral disc degeneration, lumbosacral region without mention of lumbar back pain or lower extremity pain: Secondary | ICD-10-CM

## 2021-08-21 DIAGNOSIS — M4326 Fusion of spine, lumbar region: Secondary | ICD-10-CM | POA: Diagnosis present

## 2021-08-21 DIAGNOSIS — Z5189 Encounter for other specified aftercare: Secondary | ICD-10-CM

## 2021-08-21 DIAGNOSIS — M5441 Lumbago with sciatica, right side: Secondary | ICD-10-CM | POA: Diagnosis present

## 2021-08-21 DIAGNOSIS — Z978 Presence of other specified devices: Secondary | ICD-10-CM | POA: Diagnosis present

## 2021-08-21 DIAGNOSIS — M961 Postlaminectomy syndrome, not elsewhere classified: Secondary | ICD-10-CM | POA: Diagnosis present

## 2021-08-21 DIAGNOSIS — M5137 Other intervertebral disc degeneration, lumbosacral region: Secondary | ICD-10-CM | POA: Diagnosis present

## 2021-08-21 NOTE — Patient Instructions (Signed)

## 2021-08-22 ENCOUNTER — Encounter: Payer: Self-pay | Admitting: Pain Medicine

## 2021-08-25 NOTE — Progress Notes (Unsigned)
PROVIDER NOTE: Interpretation of information contained herein should be left to medically-trained personnel. Specific patient instructions are provided elsewhere under "Patient Instructions" section of medical record. This document was created in part using STT-dictation technology, any transcriptional errors that may result from this process are unintentional.  Patient: Brian Spencer Type: Established DOB: [birth year] MRN: [id number] P[facility]: [facility]  Service: Procedure DOS: 08/27/2021 Setting: Ambulatory Location: Ambulatory outpatient facility Delivery: Face-to-face Provider: Gaspar Cola, MD Specialty: Interventional Pain Management Specialty designation: 09 Location: Outpatient facility Ref. Prov.: [place]    Primary Reason for Visit: Interventional Pain Management Treatment. CC: No chief complaint on file.  Procedure:          Type: Management of Intrathecal Drug Delivery System (IDDS) - Reservoir Refill [phone]). No rate change.  Indications: No diagnosis found. Pain Assessment: Self-Reported Pain Score:  /10             Reported level is compatible with observation.          Intrathecal Drug Delivery System (IDDS)  Pump Device:  Manufacturer: Medtronic Model: Synchromed II Model No.: [id number] Serial No.: [id number] H Delivery Route: Intrathecal Type: Programmable  Volume (mL): 40 mL reservoir Priming Volume: 0.207 mL  Calibration Constant: 117.0  MRI compatibility: Conditional   Implant Details:  Date: 10/08/20 (Third Pump) Implanter: Dr. Deetta Perla (Last)  Contact Information: [facility], [place], [place] Last Revision/Replacement: 10/08/2020 Estimated Replacement Date: May 2029  Implant Site: Abdominal Laterality: Right  Catheter: Manufacturer: Medtronic Model:  n/a  Model No.: [id number]  Serial No.: n/a  Implanted Length (cm): 94.1  Catheter Volume (mL): 0.207  Tip Location (Level): T10 Canal Access Site: L2-3  Drug  content:  Primary Medication Class: Opioid analgesic Medication: PF-Fentanyl Concentration: 2000 mcg/mL   Secondary Medication Class: Local anesthetic Medication: PF-bupivacaine Concentration: 30 mg/mL  Tertiary Medication Class: Nonopioid analgesic Medication: PF-clonidine Concentration: 10 mcg/mL  Fourth Medication Class: none  Medication: n/a  Concentration: n/a  PTC/PCA parameters:  Mode: Off/Inactive  Programming:  Type: Simple continuous.  Medication, Concentration, Infusion Program, & Delivery Rate: For up-to-date details please see most recent scanned programming printout.       Changes:  Medication Change: None at this point Rate Change: No change in rate  Reported side-effects or adverse reactions: None reported  Effectiveness: Described as relatively effective, allowing for increase in activities of daily living (ADL) Clinically meaningful improvement in function (CMIF): Sustained CMIF goals met  Plan: Pump refill today   Pharmacotherapy Assessment   Opioid Analgesic: Intrathecal Hydromorphone (Dilaudid) @ 2.399 mg/d. MME: 9.6 mg/day.   Monitoring: [place] PMP: PDMP reviewed during this encounter.       Pharmacotherapy: No side-effects or adverse reactions reported. Compliance: No problems identified. Effectiveness: Clinically acceptable. Plan: Refer to "POC". UDS:  Summary  Date Value Ref Range Status  11/12/2015 FINAL  Final    Comment:    ==================================================================== [facility] COMP DRUG ANALYSIS,UR ==================================================================== Test                             Result       Flag       Units Drug Present and Declared for Prescription Verification   7-aminoclonazepam              82           EXPECTED   ng/mg creat    7-aminoclonazepam is an expected metabolite of clonazepam. Source  of clonazepam is a scheduled prescription medication.   Trazodone                       PRESENT      EXPECTED   1,3 chlorophenyl piperazine    PRESENT      EXPECTED    1,3-chlorophenyl piperazine is an expected metabolite of    trazodone.   Verapamil                      PRESENT      EXPECTED Drug Present not Declared for Prescription Verification   Hydromorphone                  1656         UNEXPECTED ng/mg creat    Hydromorphone may be administered as a scheduled prescription    medication; it is also an expected metabolite of hydrocodone. Drug Absent but Declared for Prescription Verification   Venlafaxine                    Not Detected UNEXPECTED ==================================================================== Test                      Result    Flag   Units      Ref Range   Creatinine              73               mg/dL      >=20 ==================================================================== Declared Medications:  The flagging and interpretation on this report are based on the  following declared medications.  Unexpected results may arise from  inaccuracies in the declared medications.  **Note: The testing scope of this panel includes these medications:  Clonazepam (Klonopin)  Trazodone (Desyrel)  Venlafaxine (Effexor)  Verapamil (Calan)  **Note: The testing scope of this panel does not include following  reported medications:  Albuterol  Budenoside (Symbicort)  Chlorthalidone  Formoterol (Symbicort)  Multivitamin  Tamsulosin (Flomax)  Tiotropium (Spiriva)  Ubiquinone (Coenzyme Q 10)  Vitamin D2 (Ergocalciferol) ==================================================================== For clinical consultation, please call [phone]. ====================================================================      Pre-op H&P Assessment:  Mr. Molner is a 71 y.o. (year old), male patient, seen today for interventional treatment. 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 Mr. Montellano has a current medication list which includes the following prescription(s): acetaminophen, albuterol, albuterol, amlodipine, apixaban, atorvastatin, clonazepam, neuriva plus, magnesium oxide, metformin, multivitamin, naloxone, NON FORMULARY, ondansetron, oxygen-helium, pantoprazole, polyvinyl alcohol, potassium chloride sa, propranolol, tamsulosin, tiotropium bromide-olodaterol, torsemide, trazodone, and venlafaxine xr. His primarily concern today is the No chief complaint on file.  Initial Vital Signs:  Pulse/HCG Rate:    Temp:   Resp:   BP:   SpO2:    BMI: Estimated body mass index is 38.27 kg/m as calculated from the following:  Height as of 08/21/21: _0  (1.651 m).   Weight as of 08/21/21: 230 lb (104.3 kg).  Risk Assessment: Allergies: Reviewed. He is allergic to baclofen and hydralazine.  Allergy Precautions: None required Coagulopathies: Reviewed. None identified.  Blood-thinner therapy: None at this time Active Infection(s): Reviewed. None identified. Mr. Hynek is afebrile  Site Confirmation: Mr. Cuccia was asked to confirm the procedure and laterality before marking the site Procedure checklist: Completed Consent: Before the procedure and under the  influence of no sedative(s), amnesic(s), or anxiolytics, the patient was informed of the treatment options, risks and possible complications. To fulfill our ethical and legal obligations, as recommended by the American Medical Association's Code of Ethics, I have informed the patient of my clinical impression; the nature and purpose of the treatment or procedure; the risks, benefits, and possible complications of the intervention; the alternatives, including doing nothing; the risk(s) and benefit(s) of the alternative treatment(s) or procedure(s); and the risk(s) and benefit(s) of doing nothing.  Mr. Kellis was provided with information about the general risks and possible complications associated with most interventional procedures. These include, but are not limited to: failure to achieve desired goals, infection, bleeding, organ or nerve damage, allergic reactions, paralysis, and/or death.  In addition, he was informed of those risks and possible complications associated to this particular procedure, which include, but are not limited to: damage to the implant; failure to decrease pain; local, systemic, or serious CNS infections, intraspinal abscess with possible cord compression and paralysis, or life-threatening such as meningitis; bleeding; organ damage; nerve injury or damage with subsequent sensory, motor, and/or autonomic system dysfunction, resulting in transient or permanent pain, numbness, and/or weakness of one or several areas of the body; allergic reactions, either minor or major life-threatening, such as anaphylactic or anaphylactoid reactions.  Furthermore, Mr. Feuerborn was informed of those risks and complications associated with the medications. These include, but are not limited to: allergic reactions (i.e.: anaphylactic or anaphylactoid reactions); endorphine suppression; bradycardia and/or hypotension; water retention and/or peripheral vascular relaxation leading to lower extremity edema and  possible stasis ulcers; respiratory depression and/or shortness of breath; decreased metabolic rate leading to weight gain; swelling or edema; medication-induced neural toxicity; particulate matter embolism and blood vessel occlusion with resultant organ, and/or nervous system infarction; and/or intrathecal granuloma formation with possible spinal cord compression and permanent paralysis.  Before refilling the pump Mr. Tritschler was informed that some of the medications used in the devise may not be FDA approved for such use and therefore it constitutes an off-label use of the medications.  Finally, he was informed that Medicine is not an exact science; therefore, there is also the possibility of unforeseen or unpredictable risks and/or possible complications that may result in a catastrophic outcome. The patient indicated having understood very clearly. We have given the patient no guarantees and we have made no promises. Enough time was given to the patient to ask questions, all of which were answered to the patient's satisfaction. Mr. Mollett has indicated that he wanted to continue with the procedure. Attestation: I, the ordering provider, attest that I have discussed with the patient the benefits, risks, side-effects, alternatives, likelihood of achieving goals, and potential problems during recovery for the procedure that I have provided informed consent. Date  Time: {CHL [facility]-PAIN TIME CHOICES:21018001}  Pre-Procedure Preparation:  Monitoring: As per clinic protocol. Respiration, ETCO2, SpO2, BP, heart rate and rhythm monitor placed and checked for adequate function Safety Precautions: Patient was assessed for positional comfort and pressure points  before starting the procedure. Time-out: I initiated and conducted the "Time-out" before starting the procedure, as per protocol. The patient was asked to participate by confirming the accuracy of the "Time Out" information. Verification of the correct  person, site, and procedure were performed and confirmed by me, the nursing staff, and the patient. "Time-out" conducted as per Joint Commission's Universal Protocol (UP.01.01.01). Time:    Description of Procedure:          Position: Supine Target Area: Central-port of intrathecal pump. Approach: Anterior, 90 degree angle approach. Area Prepped: Entire Area around the pump implant. DuraPrep (Iodine Povacrylex [0.7% available iodine] and Isopropyl Alcohol, 74% w/w) Safety Precautions: Aspiration looking for blood return was conducted prior to all injections. At no point did we inject any substances, as a needle was being advanced. No attempts were made at seeking any paresthesias. Safe injection practices and needle disposal techniques used. Medications properly checked for expiration dates. SDV (single dose vial) medications used. Description of the Procedure: Protocol guidelines were followed. Two nurses trained to do implant refills were present during the entire procedure. The refill medication was checked by both healthcare providers as well as the patient. The patient was included in the "Time-out" to verify the medication. The patient was placed in position. The pump was identified. The area was prepped in the usual manner. The sterile template was positioned over the pump, making sure the side-port location matched that of the pump. Both, the pump and the template were held for stability. The needle provided in the Medtronic Kit was then introduced thru the center of the template and into the central port. The pump content was aspirated and discarded volume documented. The new medication was slowly infused into the pump, thru the filter, making sure to avoid overpressure of the device. The needle was then removed and the area cleansed, making sure to leave some of the prepping solution back to take advantage of its long term bactericidal properties. The pump was interrogated and programmed to  reflect the correct medication, volume, and dosage. The program was printed and taken to the physician for approval. Once checked and signed by the physician, a copy was provided to the patient and another scanned into the EMR.  There were no vitals filed for this visit.  Start Time:   hrs. End Time:   hrs. Materials & Medications: Medtronic Refill Kit Medication(s): Please see chart orders for details.  Imaging Guidance:          Type of Imaging Technique: None used Indication(s): N/A Exposure Time: No patient exposure Contrast: None used. Fluoroscopic Guidance: N/A Ultrasound Guidance: N/A Interpretation: N/A  Antibiotic Prophylaxis:   Anti-infectives (From admission, onward)    None      Indication(s): None identified  Post-operative Assessment:  Post-procedure Vital Signs:  Pulse/HCG Rate:    Temp:   Resp:   BP:   SpO2:    EBL: None  Complications: No immediate post-treatment complications observed by team, or reported by patient.  Note: The patient tolerated the entire procedure well. A repeat set of vitals were taken after the procedure and the patient was kept under observation following institutional policy, for this type of procedure. Post-procedural neurological assessment was performed, showing return to baseline, prior to discharge. The patient was provided with post-procedure discharge instructions, including a section on how to identify potential problems. Should any problems arise concerning this procedure, the patient was given instructions to immediately contact us, at any time, without hesitation. In any  case, we plan to contact the patient by telephone for a follow-up status report regarding this interventional procedure.  Comments:  No additional relevant information.  Plan of Care  Orders:  No orders of the defined types were placed in this encounter.  Chronic Opioid Analgesic:  Intrathecal Hydromorphone (Dilaudid) @ 2.399 mg/d. MME: 9.6 mg/day.    Medications ordered for procedure: No orders of the defined types were placed in this encounter.  Medications administered: Stefano Trulson had no medications administered during this visit.  See the medical record for exact dosing, route, and time of administration.  Follow-up plan:   No follow-ups on file.       Interventional Therapies  Risk  Complexity Considerations:   Estimated body mass index is 40.77 kg/m as calculated from the following:   Height as of this encounter: _0  (1.651 m).   Weight as of this encounter: 245 lb (111.1 kg). NOTE: ELIQUIS ANTICOAGULATION (Stop: 3days  Re-start: 6 hrs)   Planned  Pending:   (08/06/2021) medications and concentrations in the intrathecal pump change today as well as the infusion rate.  Patient instructed to give [place] a call if he experiences any problems and also to call for increases or decreases, depending on his needs.   Under consideration:   Continue management of intrathecal pump  Diagnostic bilateral lumbar facet blocks #1    Completed:   None at this time   Therapeutic  Palliative (PRN) options:   Continue management of intrathecal pump          Recent Visits Date Type Provider Dept  08/21/21 Procedure visit Milinda Pointer, MD [facility]  08/15/21 Procedure visit Milinda Pointer, MD [facility]  08/14/21 Procedure visit Milinda Pointer, MD [facility]  08/13/21 Procedure visit Milinda Pointer, MD [facility]  08/06/21 Procedure visit Milinda Pointer, MD [facility]  06/27/21 Procedure visit Milinda Pointer, MD [facility]  Showing recent visits within past 90 days and meeting all other requirements Future Appointments Date Type Provider Dept  08/27/21 Appointment Milinda Pointer, MD [facility]  Showing future appointments within next 90 days and meeting all other requirements  Disposition: Discharge home  Discharge (Date   Time): 08/27/2021;   hrs.   Primary Care Physician: [place] Location: [facility] Note by: Gaspar Cola, MD Date: 08/27/2021; Time: 3:24 PM  Disclaimer:  Medicine is not an Chief Strategy Officer. The only guarantee in medicine is that nothing is guaranteed. It is important to note that the decision to proceed with this intervention was based on the information collected from the patient. The Data and conclusions were drawn from the patient's questionnaire, the interview, and the physical examination. Because the information was provided in large part by the patient, it cannot be guaranteed that it has not been purposely or unconsciously manipulated. Every effort has been made to obtain as much relevant data as possible for this evaluation. It is important to note that the conclusions that lead to this procedure are derived in large part from the available data. Always take into account that the treatment will also be dependent on availability of resources and existing treatment guidelines, considered by other Pain Management Practitioners as being common knowledge and practice, at the time of the intervention. For Medico-Legal purposes, it is also important to point out that variation in procedural techniques and pharmacological choices are the acceptable norm. The indications, contraindications, technique, and results of the above procedure  should only be interpreted and judged by a Board-Certified Interventional Pain Specialist with extensive familiarity and expertise in the same exact procedure and technique.

## 2021-08-27 ENCOUNTER — Ambulatory Visit: Payer: Worker's Compensation | Attending: Pain Medicine | Admitting: Pain Medicine

## 2021-08-27 ENCOUNTER — Encounter: Payer: Self-pay | Admitting: Pain Medicine

## 2021-08-27 VITALS — BP 124/69 | HR 86 | Temp 98.4°F | Resp 18 | Ht 65.0 in | Wt 236.0 lb

## 2021-08-27 DIAGNOSIS — Z978 Presence of other specified devices: Secondary | ICD-10-CM | POA: Diagnosis present

## 2021-08-27 DIAGNOSIS — Z5189 Encounter for other specified aftercare: Secondary | ICD-10-CM | POA: Diagnosis present

## 2021-08-27 DIAGNOSIS — G894 Chronic pain syndrome: Secondary | ICD-10-CM | POA: Diagnosis present

## 2021-08-27 DIAGNOSIS — M5441 Lumbago with sciatica, right side: Secondary | ICD-10-CM | POA: Diagnosis present

## 2021-08-27 DIAGNOSIS — M5137 Other intervertebral disc degeneration, lumbosacral region: Secondary | ICD-10-CM | POA: Diagnosis present

## 2021-08-27 DIAGNOSIS — M4326 Fusion of spine, lumbar region: Secondary | ICD-10-CM | POA: Insufficient documentation

## 2021-08-27 DIAGNOSIS — Z451 Encounter for adjustment and management of infusion pump: Secondary | ICD-10-CM | POA: Insufficient documentation

## 2021-08-27 DIAGNOSIS — G8929 Other chronic pain: Secondary | ICD-10-CM | POA: Diagnosis present

## 2021-08-27 DIAGNOSIS — M961 Postlaminectomy syndrome, not elsewhere classified: Secondary | ICD-10-CM | POA: Diagnosis present

## 2021-08-27 DIAGNOSIS — M79604 Pain in right leg: Secondary | ICD-10-CM | POA: Diagnosis present

## 2021-08-27 DIAGNOSIS — Z79899 Other long term (current) drug therapy: Secondary | ICD-10-CM | POA: Diagnosis present

## 2021-08-28 ENCOUNTER — Telehealth: Payer: Self-pay

## 2021-08-28 NOTE — Telephone Encounter (Signed)
Post pump increase follow up phone call.  Patient states he is doing OK, but is still in a lot of pain.

## 2021-08-29 ENCOUNTER — Encounter: Payer: Self-pay | Admitting: Pain Medicine

## 2021-09-02 ENCOUNTER — Telehealth: Payer: Self-pay | Admitting: Pain Medicine

## 2021-09-02 NOTE — Telephone Encounter (Signed)
Pt stated that he is still in pain, will hold off until the scan. Also patient mailed in some papers . Please give patient a call.Thanks

## 2021-09-02 NOTE — Telephone Encounter (Signed)
He decided to do a pump increase. I told him to come at 1300 tomorrow (Tuesday). Please add him to Dr. Adalberto Cole schedule.

## 2021-09-03 ENCOUNTER — Ambulatory Visit: Payer: Worker's Compensation | Attending: Pain Medicine | Admitting: Pain Medicine

## 2021-09-03 ENCOUNTER — Encounter: Payer: Self-pay | Admitting: Pain Medicine

## 2021-09-03 VITALS — BP 118/66 | HR 81 | Temp 97.3°F | Ht 65.0 in | Wt 236.0 lb

## 2021-09-03 DIAGNOSIS — M5441 Lumbago with sciatica, right side: Secondary | ICD-10-CM | POA: Insufficient documentation

## 2021-09-03 DIAGNOSIS — G8929 Other chronic pain: Secondary | ICD-10-CM | POA: Insufficient documentation

## 2021-09-03 DIAGNOSIS — G894 Chronic pain syndrome: Secondary | ICD-10-CM | POA: Diagnosis present

## 2021-09-03 DIAGNOSIS — M5137 Other intervertebral disc degeneration, lumbosacral region: Secondary | ICD-10-CM | POA: Diagnosis present

## 2021-09-03 DIAGNOSIS — Z79899 Other long term (current) drug therapy: Secondary | ICD-10-CM | POA: Diagnosis present

## 2021-09-03 DIAGNOSIS — Z451 Encounter for adjustment and management of infusion pump: Secondary | ICD-10-CM | POA: Diagnosis present

## 2021-09-03 DIAGNOSIS — Z978 Presence of other specified devices: Secondary | ICD-10-CM | POA: Diagnosis present

## 2021-09-03 DIAGNOSIS — R52 Pain, unspecified: Secondary | ICD-10-CM | POA: Diagnosis present

## 2021-09-03 DIAGNOSIS — M4326 Fusion of spine, lumbar region: Secondary | ICD-10-CM | POA: Diagnosis present

## 2021-09-03 DIAGNOSIS — M961 Postlaminectomy syndrome, not elsewhere classified: Secondary | ICD-10-CM | POA: Diagnosis present

## 2021-09-03 DIAGNOSIS — Z5189 Encounter for other specified aftercare: Secondary | ICD-10-CM | POA: Diagnosis present

## 2021-09-03 DIAGNOSIS — M79604 Pain in right leg: Secondary | ICD-10-CM | POA: Insufficient documentation

## 2021-09-03 DIAGNOSIS — M792 Neuralgia and neuritis, unspecified: Secondary | ICD-10-CM | POA: Insufficient documentation

## 2021-09-03 DIAGNOSIS — M542 Cervicalgia: Secondary | ICD-10-CM | POA: Diagnosis present

## 2021-09-03 DIAGNOSIS — M5416 Radiculopathy, lumbar region: Secondary | ICD-10-CM | POA: Diagnosis present

## 2021-09-03 MED ORDER — GABAPENTIN 100 MG PO CAPS: ORAL_CAPSULE | ORAL | 0 refills | Status: DC

## 2021-09-03 NOTE — Progress Notes (Signed)
PROVIDER NOTE: Interpretation of information contained herein should be left to medically-trained personnel. Specific patient instructions are provided elsewhere under "Patient Instructions" section of medical record. This document was created in part using STT-dictation technology, any transcriptional errors that may result from this process are unintentional.  Patient: Brian Spencer Type: Established DOB: 29-Mar-1950 MRN: [id number] [facility]: [facility]  Service: Procedure DOS: 09/03/2021 Setting: Ambulatory Location: Ambulatory outpatient facility Delivery: Face-to-face Provider: Gaspar Cola, MD Specialty: Interventional Pain Management Specialty designation: 09 Location: Outpatient facility Ref. Prov.: [place]    Primary Reason for Visit: Interventional Pain Management Treatment. CC: Back Pain (lower)  Procedure:          Type: Management of Intrathecal Drug Delivery System (IDDS) - Analysis & Programming [phone]). 20% rate increase.  Indications: 1. Chronic pain syndrome   2. Chronic low back pain (1ry area of Pain) (Bilateral) (R>L)   3. Chronic lower extremity pain (2ry area of Pain) (Right)   4. Failed back surgical syndrome   5. DDD (degenerative disc disease), lumbosacral   6. Fusion of lumbar spine   7. Presence of intrathecal pump   8. Encounter for adjustment or management of infusion pump   9. Encounter for therapeutic procedure   10. Pharmacologic therapy   11. Encounter for medication management    Pain Assessment: Self-Reported Pain Score: 7 /10             Reported level is compatible with observation.        The patient comes into the clinic today indicating that he still having pain in the mid back as well as some neck pain.  He is also complaining of burning sensation over his thighs and hands.  When asked whether or not he was taking a membrane stabilizer he indicated that he had tried some Neurontin in the past, but he is no longer taking it.   I have recommended that he go back on it since it is the best medication for the burning sensation.  He indicated that he would need a recommendation from me so that his pain physician at [facility] can take care of that.  In addition, he indicates that he is pending a CT scan of his lower back.  He currently has a Medtronic pump that he had his MRI compatible.  I would actually recommend doing MRIs of his lumbar spine and cervical spine to reassess his current status and determine the reason for his persistent symptoms as well as these new complaints.    Intrathecal Drug Delivery System (IDDS)  Pump Device:  Manufacturer: Medtronic Model: Synchromed II Model No.: [id number] Serial No.: [id number] H Delivery Route: Intrathecal Type: Programmable  Volume (mL): 40 mL reservoir Priming Volume: 0.207 mL  Calibration Constant: 117.0  MRI compatibility: Conditional   Implant Details:  Date: 10/08/20 (Third Pump) Implanter: Dr. Deetta Perla (Last)  Contact Information: [facility], [place], [place] Last Revision/Replacement: 10/08/2020 Estimated Replacement Date: May 2029  Implant Site: Abdominal Laterality: Right  Catheter: Manufacturer: Medtronic Model:  n/a  Model No.: 8731  Serial No.: n/a  Implanted Length (cm): 94.1  Catheter Volume (mL): 0.207  Tip Location (Level): T10 Canal Access Site: L2-3  Drug content:  Primary Medication Class: Opioid analgesic Medication: PF-Fentanyl Concentration: 2000 mcg/mL   Secondary Medication Class: Local anesthetic Medication: PF-bupivacaine Concentration: 30 mg/mL  Tertiary Medication Class: Nonopioid analgesic Medication: PF-clonidine Concentration: 10 mcg/mL  Fourth Medication Class: none  Medication: n/a  Concentration: n/a  PTC/PCA parameters:  Mode: Off/Inactive  Programming:  Type: Simple continuous.  Medication, Concentration, Infusion Program, & Delivery Rate: For up-to-date details please see most recent scanned  programming printout.        Changes:  Medication Change: None at this point Rate Change: 20% increase  Reported side-effects or adverse reactions: None reported  Effectiveness: Described as relatively effective, allowing for increase in activities of daily living (ADL) Clinically meaningful improvement in function (CMIF): Sustained CMIF goals met  Plan: Pump refill today   Pharmacotherapy Assessment   Opioid Analgesic: Intrathecal Hydromorphone (Dilaudid) @ 2.399 mg/d. MME: 9.6 mg/day.   Monitoring: [place] PMP: PDMP reviewed during this encounter.       Pharmacotherapy: No side-effects or adverse reactions reported. Compliance: No problems identified. Effectiveness: Clinically acceptable. Plan: Refer to "POC". UDS:  Summary  Date Value Ref Range Status  11/12/2015 FINAL  Final    Comment:    ==================================================================== TOXASSURE COMP DRUG ANALYSIS,UR ==================================================================== Test                             Result       Flag       Units Drug Present and Declared for Prescription Verification   7-aminoclonazepam              82           EXPECTED   ng/mg creat    7-aminoclonazepam is an expected metabolite of clonazepam. Source    of clonazepam is a scheduled prescription medication.   Trazodone                      PRESENT      EXPECTED   1,3 chlorophenyl piperazine    PRESENT      EXPECTED    1,3-chlorophenyl piperazine is an expected metabolite of    trazodone.   Verapamil                      PRESENT      EXPECTED Drug Present not Declared for Prescription Verification   Hydromorphone                  1656         UNEXPECTED ng/mg creat    Hydromorphone may be administered as a scheduled prescription    medication; it is also an expected metabolite of hydrocodone. Drug Absent but Declared for Prescription Verification   Venlafaxine                    Not Detected  UNEXPECTED ==================================================================== Test                      Result    Flag   Units      Ref Range   Creatinine              73               mg/dL      >=20 ==================================================================== Declared Medications:  The flagging and interpretation on this report are based on the  following declared medications.  Unexpected results may arise from  inaccuracies in the declared medications.  **Note: The testing scope of this panel includes these medications:  Clonazepam (Klonopin)  Trazodone (Desyrel)  Venlafaxine (Effexor)  Verapamil (Calan)  **Note: The testing scope of this panel does not include following  reported medications:  Albuterol  Budenoside (Symbicort)  Chlorthalidone  Formoterol (Symbicort)  Multivitamin  Tamsulosin (Flomax)  Tiotropium (Spiriva)  Ubiquinone (Coenzyme Q 10)  Vitamin D2 (Ergocalciferol) ==================================================================== For clinical consultation, please call [phone]. ====================================================================      Pre-op H&P Assessment:  Mr. Antenucci is a 71 y.o. (year old), male patient, seen today for interventional treatment. 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 Mr. Mcenery has a current medication list which includes the following prescription(s): acetaminophen, albuterol, albuterol, amlodipine, apixaban, atorvastatin, clonazepam, neuriva plus, gabapentin, magnesium oxide, metformin, multivitamin, naloxone, NON FORMULARY, ondansetron, oxygen-helium, pantoprazole, polyvinyl alcohol, potassium chloride sa, propranolol, tamsulosin, tiotropium bromide-olodaterol, torsemide, trazodone, and venlafaxine xr. His primarily concern today is the Back Pain (lower)  Initial Vital Signs:  Pulse/HCG Rate: 81  Temp: (!) 97.3 F (36.3 C) Resp:   BP: 118/66 SpO2: 98 %  BMI: Estimated body mass index is 39.27 kg/m as calculated from the following:   Height as of this encounter: 5' 5"  (1.651 m).   Weight as of this encounter: 236 lb (107 kg).  Risk Assessment: Allergies: Reviewed. He is allergic to baclofen and hydralazine.  Allergy Precautions: None required Coagulopathies: Reviewed. None identified.  Blood-thinner therapy: None at this time Active Infection(s): Reviewed. None identified. Mr. Bouse is afebrile  Site Confirmation: Mr. Cordial was asked to confirm the procedure and laterality before marking the site Procedure checklist: Completed Consent: Before the procedure and under the influence of no sedative(s), amnesic(s), or anxiolytics, the patient was informed of the treatment options, risks and possible complications. To fulfill our ethical and legal obligations, as recommended by the American Medical Association's Code of Ethics, I have informed the patient of my clinical impression; the nature and purpose of the treatment or procedure; the risks, benefits, and possible complications of the intervention; the alternatives, including doing nothing; the risk(s) and benefit(s) of the alternative treatment(s)  or procedure(s); and the risk(s) and benefit(s) of doing nothing.  Mr. Commons was provided with information about the general risks and possible complications associated with most interventional procedures. These include, but are not limited to: failure to achieve desired goals, infection, bleeding, organ or nerve damage, allergic reactions, paralysis, and/or death.  In addition, he was informed of those risks and possible complications associated to this particular procedure, which include, but are not limited to: damage to the implant; failure to decrease pain; local, systemic, or serious CNS infections, intraspinal abscess with possible cord compression and paralysis, or life-threatening such as meningitis; bleeding; organ damage; nerve injury or damage with subsequent sensory, motor, and/or autonomic system dysfunction, resulting in transient or permanent pain, numbness, and/or weakness of one or several areas of the body; allergic reactions, either minor or major life-threatening, such as anaphylactic or anaphylactoid reactions.  Furthermore, Mr. Liou was informed of those risks and complications associated with the medications. These include, but are not limited to: allergic reactions (i.e.: anaphylactic or anaphylactoid reactions); endorphine suppression;  bradycardia and/or hypotension; water retention and/or peripheral vascular relaxation leading to lower extremity edema and possible stasis ulcers; respiratory depression and/or shortness of breath; decreased metabolic rate leading to weight gain; swelling or edema; medication-induced neural toxicity; particulate matter embolism and blood vessel occlusion with resultant organ, and/or nervous system infarction; and/or intrathecal granuloma formation with possible spinal cord compression and permanent paralysis.  Before refilling the pump Mr. Polinsky was informed that some of the medications used in the devise may not be FDA approved for such use and  therefore it constitutes an off-label use of the medications.  Finally, he was informed that Medicine is not an exact science; therefore, there is also the possibility of unforeseen or unpredictable risks and/or possible complications that may result in a catastrophic outcome. The patient indicated having understood very clearly. We have given the patient no guarantees and we have made no promises. Enough time was given to the patient to ask questions, all of which were answered to the patient's satisfaction. Mr. Mah has indicated that he wanted to continue with the procedure. Attestation: I, the ordering provider, attest that I have discussed with the patient the benefits, risks, side-effects, alternatives, likelihood of achieving goals, and potential problems during recovery for the procedure that I have provided informed consent. Date  Time: 09/03/2021 12:22 PM  Pre-Procedure Preparation:  Monitoring: As per clinic protocol. Respiration, ETCO2, SpO2, BP, heart rate and rhythm monitor placed and checked for adequate function Safety Precautions: Patient was assessed for positional comfort and pressure points before starting the procedure. Time-out: I initiated and conducted the "Time-out" before starting the procedure, as per protocol. The patient was asked to participate by confirming the accuracy of the "Time Out" information. Verification of the correct person, site, and procedure were performed and confirmed by me, the nursing staff, and the patient. "Time-out" conducted as per Joint Commission's Universal Protocol (UP.01.01.01). Time:    Description of Procedure:          Position: Supine Target Area: Central-port of intrathecal pump. Approach: Anterior, 90 degree angle approach. Area Prepped: Entire Area around the pump implant. DuraPrep (Iodine Povacrylex [0.7% available iodine] and Isopropyl Alcohol, 74% w/w) Safety Precautions: Aspiration looking for blood return was conducted prior to  all injections. At no point did we inject any substances, as a needle was being advanced. No attempts were made at seeking any paresthesias. Safe injection practices and needle disposal techniques used. Medications properly checked for expiration dates. SDV (single dose vial) medications used. Description of the Procedure: Protocol guidelines were followed. Two nurses trained to do implant refills were present during the entire procedure. The refill medication was checked by both healthcare providers as well as the patient. The patient was included in the "Time-out" to verify the medication. The patient was placed in position. The pump was identified. The area was prepped in the usual manner. The sterile template was positioned over the pump, making sure the side-port location matched that of the pump. Both, the pump and the template were held for stability. The needle provided in the Medtronic Kit was then introduced thru the center of the template and into the central port. The pump content was aspirated and discarded volume documented. The new medication was slowly infused into the pump, thru the filter, making sure to avoid overpressure of the device. The needle was then removed and the area cleansed, making sure to leave some of the prepping solution back to take advantage of its long term bactericidal properties. The pump  was interrogated and programmed to reflect the correct medication, volume, and dosage. The program was printed and taken to the physician for approval. Once checked and signed by the physician, a copy was provided to the patient and another scanned into the EMR.  Vitals:   09/03/21 1218  BP: 118/66  Pulse: 81  Temp: (!) 97.3 F (36.3 C)  SpO2: 98%  Weight: 236 lb (107 kg)  Height: 5' 5"  (1.651 m)    Start Time:   hrs. End Time:   hrs. Materials & Medications: Medtronic Refill Kit Medication(s): Please see chart orders for details.  Imaging Guidance:          Type of Imaging  Technique: None used Indication(s): N/A Exposure Time: No patient exposure Contrast: None used. Fluoroscopic Guidance: N/A Ultrasound Guidance: N/A Interpretation: N/A  Antibiotic Prophylaxis:   Anti-infectives (From admission, onward)    None      Indication(s): None identified  Post-operative Assessment:  Post-procedure Vital Signs:  Pulse/HCG Rate: 81  Temp: (!) 97.3 F (36.3 C) Resp:   BP: 118/66 SpO2: 98 %  EBL: None  Complications: No immediate post-treatment complications observed by team, or reported by patient.  Note: The patient tolerated the entire procedure well. A repeat set of vitals were taken after the procedure and the patient was kept under observation following institutional policy, for this type of procedure. Post-procedural neurological assessment was performed, showing return to baseline, prior to discharge. The patient was provided with post-procedure discharge instructions, including a section on how to identify potential problems. Should any problems arise concerning this procedure, the patient was given instructions to immediately contact us, at any time, without hesitation. In any case, we plan to contact the patient by telephone for a follow-up status report regarding this interventional procedure.  Comments:  No additional relevant information.  Plan of Care  Orders:  Orders Placed This Encounter  Procedures   PUMP REPROGRAM    Follow programming protocol by having two(2) healthcare providers present during programming.    Scheduling Instructions:     Please perform the following adjustment: Increase rate by 20%.    Order Specific Question:   Where will this procedure be performed?    Answer:   [facility]   MR LUMBAR SPINE WO CONTRAST    Patient presents with axial pain with possible radicular component. Please assist [place] in identifying specific level(s) and laterality of any additional findings such as: 1. Facet (Zygapophyseal)  joint DJD (Hypertrophy, space narrowing, subchondral sclerosis, and/or osteophyte formation) 2. DDD and/or IVDD (Loss of disc height, desiccation, gas patterns, osteophytes, endplate sclerosis, or "Black disc disease") 3. Pars defects 4. Spondylolisthesis, spondylosis, and/or spondyloarthropathies (include Degree/Grade of displacement in mm) (stability) 5. Vertebral body Fractures (acute/chronic) (state percentage of collapse) 6. Demineralization (osteopenia/osteoporotic) 7. Bone pathology 8. Foraminal narrowing  9. Surgical changes 10. Central, Lateral Recess, and/or Foraminal Stenosis (include AP diameter of stenosis in mm) 11. Surgical changes (hardware type, status, and presence of fibrosis) 12. Modic Type Changes (MRI only) 13. IVDD (Disc bulge, protrusion, herniation, extrusion) (Level, laterality, extent)    Standing Status:   Future    Standing Expiration Date:   10/04/2021    Scheduling Instructions:     Imaging must be done as soon as possible. Inform patient that order will expire within 30 days and I will not renew it.    Order Specific Question:   What is the patient's sedation requirement?    Answer:   No Sedation  Order Specific Question:   Does the patient have a pacemaker or implanted devices?    Answer:   No    Order Specific Question:   Preferred imaging location?    Answer:   [facility] (table limit-350lbs)    Order Specific Question:   Call Results- Best Contact Number?    Answer:   [phone] ([facility])    Order Specific Question:   Radiology Contrast Protocol - do NOT remove file path    Answer:   [email or web address]   Chronic Opioid Analgesic:  Intrathecal Hydromorphone (Dilaudid) @ 2.399 mg/d. MME: 9.6 mg/day.   Medications ordered for procedure: Meds ordered this encounter  Medications   gabapentin (NEURONTIN) 100 MG capsule    Sig: Take 1 capsule (100 mg total) by mouth at bedtime for 14 days, THEN 2  capsules (200 mg total) at bedtime for 14 days, THEN 3 capsules (300 mg total) at bedtime. Follow written titration schedule.    Dispense:  132 capsule    Refill:  0    Fill one day early if pharmacy is closed on scheduled refill date. May substitute for generic if available.   Medications administered: Damaria Stofko had no medications administered during this visit.  See the medical record for exact dosing, route, and time of administration.  Follow-up plan:   Return for Pump Refill (Max:59mo.       Interventional Therapies  Risk  Complexity Considerations:   Estimated body mass index is 40.77 kg/m as calculated from the following:   Height as of this encounter: 5' 5"  (1.651 m).   Weight as of this encounter: 245 lb (111.1 kg). NOTE: ELIQUIS ANTICOAGULATION (Stop: 3days  Re-start: 6 hrs)   Planned  Pending:   (08/06/2021) medications and concentrations in the intrathecal pump change today as well as the infusion rate.  Patient instructed to give [place] call if he experiences any problems and also to call for increases or decreases, depending on his needs.   Under consideration:   Continue management of intrathecal pump  Diagnostic bilateral lumbar facet blocks #1    Completed:   None at this time   Therapeutic  Palliative (PRN) options:   Continue management of intrathecal pump           Recent Visits Date Type Provider Dept  08/27/21 Procedure visit NMilinda Pointer MD [facility]  08/21/21 Procedure visit NMilinda Pointer MD [facility]  08/15/21 Procedure visit NMilinda Pointer MD [facility]  08/14/21 Procedure visit NMilinda Pointer MD [facility]  08/13/21 Procedure visit NMilinda Pointer MD [facility]  08/06/21 Procedure visit NMilinda Pointer MD [facility]  06/27/21 Procedure visit NMilinda Pointer MD [facility]  Showing recent visits within past 90 days and meeting all other  requirements Today's Visits Date Type Provider Dept  09/03/21 Procedure visit NMilinda Pointer MD [facility]  Showing today's visits and meeting all other requirements Future Appointments No visits were found meeting these conditions. Showing future appointments within next 90 days and meeting all other requirements  Disposition: Discharge home  Discharge (Date  Time): 09/03/2021; 1240 hrs.   [place] [place] Note by: FGaspar Cola MD Date: 09/03/2021; Time: 12:58 PM  Disclaimer:  Medicine is not an eChief Strategy Officer The only guarantee in medicine is that nothing is guaranteed. It is important to note that the decision to proceed with this intervention was based on  the information collected from the patient. The Data and conclusions were drawn from the patient's questionnaire, the interview, and the physical examination. Because the information was provided in large part by the patient, it cannot be guaranteed that it has not been purposely or unconsciously manipulated. Every effort has been made to obtain as much relevant data as possible for this evaluation. It is important to note that the conclusions that lead to this procedure are derived in large part from the available data. Always take into account that the treatment will also be dependent on availability of resources and existing treatment guidelines, considered by other Pain Management Practitioners as being common knowledge and practice, at the time of the intervention. For Medico-Legal purposes, it is also important to point out that variation in procedural techniques and pharmacological choices are the acceptable norm. The indications, contraindications, technique, and results of the above procedure should only be interpreted and judged by a Board-Certified Interventional Pain Specialist with extensive familiarity and expertise in the same  exact procedure and technique.

## 2021-09-03 NOTE — Progress Notes (Signed)
Safety precautions to be maintained throughout the outpatient stay will include: orient to surroundings, keep bed in low position, maintain call bell within reach at all times, provide assistance with transfer out of bed and ambulation.  

## 2021-09-04 ENCOUNTER — Telehealth: Payer: Self-pay

## 2021-09-04 NOTE — Telephone Encounter (Signed)
Post procedure phone call. Patient states he is doing well.  

## 2021-09-16 ENCOUNTER — Telehealth: Payer: Self-pay | Admitting: Pain Medicine

## 2021-09-16 NOTE — Telephone Encounter (Signed)
Patient wanted to make Dr Dossie Arbour know that he stopped taking the Gabapentin meds.

## 2021-10-07 ENCOUNTER — Telehealth: Payer: Self-pay | Admitting: Pain Medicine

## 2021-10-07 NOTE — Telephone Encounter (Signed)
PT stated that he mailed in a copied of scan.

## 2021-10-17 ENCOUNTER — Other Ambulatory Visit: Payer: Self-pay

## 2021-10-17 ENCOUNTER — Encounter: Payer: Self-pay | Admitting: Pain Medicine

## 2021-10-17 ENCOUNTER — Ambulatory Visit: Payer: Worker's Compensation | Attending: Pain Medicine | Admitting: Pain Medicine

## 2021-10-17 VITALS — BP 138/72 | HR 68 | Temp 97.4°F | Resp 20 | Ht 65.0 in | Wt 235.0 lb

## 2021-10-17 DIAGNOSIS — M5137 Other intervertebral disc degeneration, lumbosacral region: Secondary | ICD-10-CM | POA: Insufficient documentation

## 2021-10-17 DIAGNOSIS — Z451 Encounter for adjustment and management of infusion pump: Secondary | ICD-10-CM | POA: Insufficient documentation

## 2021-10-17 DIAGNOSIS — M5441 Lumbago with sciatica, right side: Secondary | ICD-10-CM | POA: Insufficient documentation

## 2021-10-17 DIAGNOSIS — M79604 Pain in right leg: Secondary | ICD-10-CM | POA: Insufficient documentation

## 2021-10-17 DIAGNOSIS — M4326 Fusion of spine, lumbar region: Secondary | ICD-10-CM | POA: Insufficient documentation

## 2021-10-17 DIAGNOSIS — G894 Chronic pain syndrome: Secondary | ICD-10-CM | POA: Insufficient documentation

## 2021-10-17 DIAGNOSIS — Z79899 Other long term (current) drug therapy: Secondary | ICD-10-CM | POA: Insufficient documentation

## 2021-10-17 DIAGNOSIS — M961 Postlaminectomy syndrome, not elsewhere classified: Secondary | ICD-10-CM | POA: Insufficient documentation

## 2021-10-17 DIAGNOSIS — Z5189 Encounter for other specified aftercare: Secondary | ICD-10-CM | POA: Diagnosis present

## 2021-10-17 DIAGNOSIS — G8929 Other chronic pain: Secondary | ICD-10-CM | POA: Insufficient documentation

## 2021-10-17 DIAGNOSIS — Z978 Presence of other specified devices: Secondary | ICD-10-CM | POA: Insufficient documentation

## 2021-10-17 MED ORDER — PAIN MANAGEMENT IT PUMP REFILL: 1.0000 | Freq: Once | INTRATHECAL | 0 refills | Status: AC

## 2021-10-17 NOTE — Progress Notes (Signed)
PROVIDER NOTE: Interpretation of information contained herein should be left to medically-trained personnel. Specific patient instructions are provided elsewhere under "Patient Instructions" section of medical record. This document was created in part using STT-dictation technology, any transcriptional errors that may result from this process are unintentional.  Patient: Brian Spencer Type: Established DOB: [birth year] MRN: [id number] PCP: [facility]  Service: Procedure DOS: 10/17/2021 Setting: Ambulatory Location: Ambulatory outpatient facility Delivery: Face-to-face Provider: Gaspar Cola, MD Specialty: Interventional Pain Management Specialty designation: 09 Location: Outpatient facility Ref. Prov.: [place]    Primary Reason for Visit: Interventional Pain Management Treatment. CC: Back Pain and Leg Pain  Procedure:          Type: Management of Intrathecal Drug Delivery System (IDDS) - Analysis & Programming [phone]). 20% rate increase.  Indications: 1. Chronic pain syndrome   2. Chronic low back pain (1ry area of Pain) (Bilateral) (R>L)   3. Chronic lower extremity pain (2ry area of Pain) (Right)   4. Failed back surgical syndrome   5. DDD (degenerative disc disease), lumbosacral   6. Fusion of lumbar spine   7. Presence of intrathecal pump   8. Encounter for adjustment or management of infusion pump   9. Encounter for therapeutic procedure   10. Pharmacologic therapy   11. Encounter for medication management    Pain Assessment: Self-Reported Pain Score: 6 /10             Reported level is compatible with observation.        The patient indicates that he is still having low back pain as well as pain and numbness on his feet.  He comes in today requesting an increase in his pump rate.  Based on his current medication infusion, we can probably increase it up to 20%.  However, we our reaching maximum recommended daily doses of the bupivacaine and since he is  already experiencing some lower extremity numbness, on his next refill we plan to decrease the concentration of the bupivacaine from 30 mg/mL to 20 mg/mL.  Hopefully this will help in bringing down some of the numbness and weakness.  We will continue to concentrate the fentanyl as tolerated.  The patient indicates having had a CT scan of the lumbar spine and having sent me the official report on that scan.  Today I have reviewed the electronic medical record and I cannot find where they have scanned that into the system.    Intrathecal Drug Delivery System (IDDS)  Pump Device:  Manufacturer: Medtronic Model: Synchromed II Model No.: [id number] Serial No.: [id number] H Delivery Route: Intrathecal Type: Programmable  Volume (mL): 40 mL reservoir Priming Volume: 0.207 mL  Calibration Constant: 117.0  MRI compatibility: Conditional   Implant Details:  Date: 10/08/20 (Third Pump) Implanter: Dr. Deetta Perla (Last)  Contact Information: [facility], [place], [place] Last Revision/Replacement: 10/08/2020 Estimated Replacement Date: May 2029  Implant Site: Abdominal Laterality: Right  Catheter: Manufacturer: Medtronic Model:  n/a  Model No.: 8731  Serial No.: n/a  Implanted Length (cm): 94.1  Catheter Volume (mL): 0.207  Tip Location (Level): T10 Canal Access Site: L2-3  Drug content:  Primary Medication Class: Opioid analgesic Medication: PF-Fentanyl Concentration: 2000 mcg/mL   Secondary Medication Class: Local anesthetic Medication: PF-bupivacaine Concentration: 30 mg/mL  Tertiary Medication Class: Nonopioid analgesic Medication: PF-clonidine Concentration: 10 mcg/mL  Fourth Medication Class: none  Medication: n/a  Concentration: n/a  PTC/PCA parameters:  Mode: Off/Inactive  Programming:  Type: Simple continuous.  Medication, Concentration, Infusion Program, &  Delivery Rate: For up-to-date details please see most recent scanned programming printout.          Changes:  Medication Change: None at this point Rate Change: 20% increase  Reported side-effects or adverse reactions: None reported  Effectiveness: Described as relatively effective, allowing for increase in activities of daily living (ADL) Clinically meaningful improvement in function (CMIF): Sustained CMIF goals met  Plan: Pump refill today   Pharmacotherapy Assessment   Opioid Analgesic: Intrathecal Hydromorphone (Dilaudid) @ 2.399 mg/d. MME: 9.6 mg/day.   Monitoring: [place] PMP: PDMP reviewed during this encounter.       Pharmacotherapy: No side-effects or adverse reactions reported. Compliance: No problems identified. Effectiveness: Clinically acceptable. Plan: Refer to "POC". UDS:  Summary  Date Value Ref Range Status  11/12/2015 FINAL  Final    Comment:    ==================================================================== TOXASSURE COMP DRUG ANALYSIS,UR ==================================================================== Test                             Result       Flag       Units Drug Present and Declared for Prescription Verification   7-aminoclonazepam              82           EXPECTED   ng/mg creat    7-aminoclonazepam is an expected metabolite of clonazepam. Source    of clonazepam is a scheduled prescription medication.   Trazodone                      PRESENT      EXPECTED   1,3 chlorophenyl piperazine    PRESENT      EXPECTED    1,3-chlorophenyl piperazine is an expected metabolite of    trazodone.   Verapamil                      PRESENT      EXPECTED Drug Present not Declared for Prescription Verification   Hydromorphone                  1656         UNEXPECTED ng/mg creat    Hydromorphone may be administered as a scheduled prescription    medication; it is also an expected metabolite of hydrocodone. Drug Absent but Declared for Prescription Verification   Venlafaxine                    Not Detected  UNEXPECTED ==================================================================== Test                      Result    Flag   Units      Ref Range   Creatinine              73               mg/dL      >=20 ==================================================================== Declared Medications:  The flagging and interpretation on this report are based on the  following declared medications.  Unexpected results may arise from  inaccuracies in the declared medications.  **Note: The testing scope of this panel includes these medications:  Clonazepam (Klonopin)  Trazodone (Desyrel)  Venlafaxine (Effexor)  Verapamil (Calan)  **Note: The testing scope of this panel does not include following  reported medications:  Albuterol  Budenoside (Symbicort)  Chlorthalidone  Formoterol (Symbicort)  Multivitamin  Tamsulosin (Flomax)  Tiotropium (Spiriva)  Ubiquinone (Coenzyme Q 10)  Vitamin D2 (Ergocalciferol) ==================================================================== For clinical consultation, please call [phone]. ====================================================================    No results found for: "CBDTHCR", "D8THCCBX", "D9THCCBX"   Pre-op H&P Assessment:  Mr. Frazzini is a 71 y.o. (year old), male patient, seen today for interventional treatment. 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 Mr. Cicio has a current medication list which includes the following prescription(s): acetaminophen, albuterol, albuterol, amlodipine, apixaban, atorvastatin, clonazepam, neuriva plus, magnesium oxide, metformin, multivitamin, naloxone, NON FORMULARY, ondansetron, oxygen-helium, pantoprazole, polyvinyl alcohol, potassium chloride sa, propranolol, tamsulosin, tiotropium bromide-olodaterol, torsemide, trazodone, venlafaxine xr, and gabapentin. His primarily concern today is the Back Pain and Leg Pain  Initial Vital Signs:  Pulse/HCG Rate: 68  Temp: (!) 97.4 F (36.3 C) Resp: 20 BP: 138/72 SpO2: 97 %  BMI: Estimated body mass index is 39.11 kg/m as calculated from the following:   Height as of this encounter: $RemoveBeforeD'5\' 5"'qtHpJvlUEmZVPR$  (1.651 m).   Weight as of this encounter: 235 lb (106.6 kg).  Risk Assessment: Allergies: Reviewed. He is allergic to baclofen and hydralazine.  Allergy Precautions: None required Coagulopathies: Reviewed. None identified.  Blood-thinner therapy: None at this time Active Infection(s): Reviewed. None identified. Mr. Brod is afebrile  Site Confirmation: Mr. Nicolls was asked to confirm the procedure and laterality before marking the site Procedure checklist: Completed Consent: Before the procedure and under the influence of no sedative(s), amnesic(s), or anxiolytics, the patient was informed of the treatment options, risks and possible complications. To fulfill our ethical and legal obligations, as recommended by the American Medical Association's Code of Ethics, I have informed the patient of my clinical impression; the nature and purpose of the treatment or procedure; the risks, benefits, and possible complications of the intervention; the alternatives, including doing  nothing; the risk(s) and benefit(s) of the alternative treatment(s) or procedure(s); and the risk(s) and benefit(s) of doing nothing.  Mr. Northup was provided with information about the general risks and possible complications associated with most interventional procedures. These include, but are not limited to: failure to achieve desired goals, infection, bleeding, organ or nerve damage, allergic reactions, paralysis, and/or death.  In addition, he was informed of those risks and possible complications associated to this particular procedure, which include, but are not limited to: damage to the implant; failure to decrease pain; local, systemic, or serious CNS infections, intraspinal abscess with possible cord compression and paralysis, or life-threatening such as meningitis; bleeding; organ damage; nerve injury or damage with subsequent sensory, motor, and/or autonomic system dysfunction, resulting in transient or permanent pain, numbness, and/or weakness of one or several areas of the body; allergic reactions, either minor or major life-threatening, such as anaphylactic or anaphylactoid reactions.  Furthermore, Mr. Hoogland was informed of those risks and complications associated with the medications. These include, but are not limited to: allergic reactions (i.e.: anaphylactic or anaphylactoid reactions); endorphine suppression; bradycardia and/or hypotension; water retention  and/or peripheral vascular relaxation leading to lower extremity edema and possible stasis ulcers; respiratory depression and/or shortness of breath; decreased metabolic rate leading to weight gain; swelling or edema; medication-induced neural toxicity; particulate matter embolism and blood vessel occlusion with resultant organ, and/or nervous system infarction; and/or intrathecal granuloma formation with possible spinal cord compression and permanent paralysis.  Before refilling the pump Mr. Oriley was informed that some of the  medications used in the devise may not be FDA approved for such use and therefore it constitutes an off-label use of the medications.  Finally, he was informed that Medicine is not an exact science; therefore, there is also the possibility of unforeseen or unpredictable risks and/or possible complications that may result in a catastrophic outcome. The patient indicated having understood very clearly. We have given the patient no guarantees and we have made no promises. Enough time was given to the patient to ask questions, all of which were answered to the patient's satisfaction. Mr. Baranek has indicated that he wanted to continue with the procedure. Attestation: I, the ordering provider, attest that I have discussed with the patient the benefits, risks, side-effects, alternatives, likelihood of achieving goals, and potential problems during recovery for the procedure that I have provided informed consent. Date  Time: 10/17/2021 10:10 AM  Pre-Procedure Preparation:  Monitoring: As per clinic protocol. Respiration, ETCO2, SpO2, BP, heart rate and rhythm monitor placed and checked for adequate function Safety Precautions: Patient was assessed for positional comfort and pressure points before starting the procedure. Time-out: I initiated and conducted the "Time-out" before starting the procedure, as per protocol. The patient was asked to participate by confirming the accuracy of the "Time Out" information. Verification of the correct person, site, and procedure were performed and confirmed by me, the nursing staff, and the patient. "Time-out" conducted as per Joint Commission's Universal Protocol (UP.01.01.01). Time:    Description of Procedure:          Position: Supine Target Area: Area over the pump implant. Approach: Radiofrequency communication between antenna and programmable pump. Area Prepped: Not required for simple analysis and programming None required Safety Precautions: At least two  trained healthcare practitioners present to verify programming and calculations. Description of the Procedure: Protocol guidelines were followed.      The pump was interrogated and programmed to reflect the correct medication, volume, and dosage. The program was printed and taken to the physician for approval. Once checked and signed by the physician, a copy was provided to the patient and another scanned into the EMR.  Vitals:   10/17/21 1010  BP: 138/72  Pulse: 68  Resp: 20  Temp: (!) 97.4 F (36.3 C)  SpO2: 97%  Weight: 235 lb (106.6 kg)  Height: $Remove'5\' 5"'dmVulow$  (1.651 m)    Start Time:   hrs. End Time:   hrs. Materials & Medications: Medtronic Refill Kit Medication(s): Please see chart orders for details.  Imaging Guidance:          Type of Imaging Technique: None used Indication(s): N/A Exposure Time: No patient exposure Contrast: None used. Fluoroscopic Guidance: N/A Ultrasound Guidance: N/A Interpretation: N/A  Antibiotic Prophylaxis:   Anti-infectives (From admission, onward)    None      Indication(s): None identified  Post-operative Assessment:  Post-procedure Vital Signs:  Pulse/HCG Rate: 68  Temp: (!) 97.4 F (36.3 C) Resp: 20 BP: 138/72 SpO2: 97 %  EBL: None  Complications: No immediate post-treatment complications observed by team, or reported by patient.  Note: The patient  tolerated the entire procedure well. A repeat set of vitals were taken after the procedure and the patient was kept under observation following institutional policy, for this type of procedure. Post-procedural neurological assessment was performed, showing return to baseline, prior to discharge. The patient was provided with post-procedure discharge instructions, including a section on how to identify potential problems. Should any problems arise concerning this procedure, the patient was given instructions to immediately contact us, at any time, without hesitation. In any case, we plan to  contact the patient by telephone for a follow-up status report regarding this interventional procedure.  Comments:  No additional relevant information.  Plan of Care  Orders:  Orders Placed This Encounter  Procedures   PUMP REFILL    Whenever possible schedule on a procedure today.    Standing Status:   Future    Standing Expiration Date:   02/16/2022    Scheduling Instructions:     Please schedule intrathecal pump refill based on pump programming. Avoid schedule intervals of more than 120 days (4 months).    Order Specific Question:   Where will this procedure be performed?    Answer:   [facility]   PUMP REPROGRAM    Follow programming protocol by having two(2) healthcare providers present during programming.    Scheduling Instructions:     Please perform the following adjustment: Increase rate by 20%.    Order Specific Question:   Where will this procedure be performed?    Answer:   [facility]   Chronic Opioid Analgesic:  Intrathecal Hydromorphone (Dilaudid) @ 2.399 mg/d. MME: 9.6 mg/day.   Medications ordered for procedure: No orders of the defined types were placed in this encounter.  Medications administered: Jimi Schappert had no medications administered during this visit.  See the medical record for exact dosing, route, and time of administration.  Follow-up plan:   Return for Pump Refill (Max:38mo.       Interventional Therapies  Risk  Complexity Considerations:   Estimated body mass index is 40.77 kg/m as calculated from the following:   Height as of this encounter: _0  (1.651 m).   Weight as of this encounter: 245 lb (111.1 kg). NOTE: ELIQUIS ANTICOAGULATION (Stop: 3days  Re-start: 6 hrs)   Planned  Pending:   (10/17/2021) we plan to decrease the concentration of his intrathecal bupivacaine from 30 mg/mL to 20 mg/mL on his next refill.   Under consideration:   Continue management of intrathecal pump  Diagnostic bilateral lumbar facet  blocks #1    Completed:   None at this time   Therapeutic  Palliative (PRN) options:   Continue management of intrathecal pump     Recent Visits Date Type Provider Dept  09/03/21 Procedure visit NMilinda Pointer MD [facility]  08/27/21 Procedure visit NMilinda Pointer MD [facility]  08/21/21 Procedure visit NMilinda Pointer MD [facility]  08/15/21 Procedure visit NMilinda Pointer MD [facility]  08/14/21 Procedure visit NMilinda Pointer MD [facility]  08/13/21 Procedure visit NMilinda Pointer MD [facility]  08/06/21 Procedure visit NMilinda Pointer MD [facility]  Showing recent visits within past 90 days and meeting all other requirements Today's Visits Date Type Provider Dept  10/17/21 Procedure visit NMilinda Pointer MD [facility]  Showing today's visits and meeting all other requirements Future Appointments Date Type Provider Dept  11/12/21 Appointment NMilinda Pointer MD [facility]  Showing future appointments within next 90 days and  meeting all other requirements  Disposition: Discharge home  Discharge (Date  Time): 10/17/2021; 1026 hrs.   Primary Care Physician: [place] Location: [facility] Facility Note by: Gaspar Cola, MD Date: 10/17/2021; Time: 12:56 PM  Disclaimer:  Medicine is not an Chief Strategy Officer. The only guarantee in medicine is that nothing is guaranteed. It is important to note that the decision to proceed with this intervention was based on the information collected from the patient. The Data and conclusions were drawn from the patient's questionnaire, the interview, and the physical examination. Because the information was provided in large part by the patient, it cannot be guaranteed that it has not been purposely or unconsciously manipulated. Every effort has been made to obtain as much relevant  data as possible for this evaluation. It is important to note that the conclusions that lead to this procedure are derived in large part from the available data. Always take into account that the treatment will also be dependent on availability of resources and existing treatment guidelines, considered by other Pain Management Practitioners as being common knowledge and practice, at the time of the intervention. For Medico-Legal purposes, it is also important to point out that variation in procedural techniques and pharmacological choices are the acceptable norm. The indications, contraindications, technique, and results of the above procedure should only be interpreted and judged by a Board-Certified Interventional Pain Specialist with extensive familiarity and expertise in the same exact procedure and technique.

## 2021-10-17 NOTE — Progress Notes (Signed)
Safety precautions to be maintained throughout the outpatient stay will include: orient to surroundings, keep bed in low position, maintain call bell within reach at all times, provide assistance with transfer out of bed and ambulation.  

## 2021-10-17 NOTE — Patient Instructions (Signed)
Patient has Narcan at home  Opioid Overdose Opioids are drugs that are often used to treat pain. Opioids include illegal drugs, such as heroin, as well as prescription pain medicines, such as codeine, morphine, hydrocodone, and fentanyl. An opioid overdose happens when you take too much of an opioid. An overdose may be intentional or accidental and can happen with any type of opioid. The effects of an overdose can be mild, dangerous, or even deadly. Opioid overdose is a medical emergency. What are the causes? This condition may be caused by: Taking too much of an opioid on purpose. Taking too much of an opioid by accident. Using two or more substances that contain opioids at the same time. Taking an opioid with a substance that affects your heart, breathing, or blood pressure. These include alcohol, tranquilizers, sleeping pills, illegal drugs, and some over-the-counter medicines. This condition may also happen due to an error made by: A health care provider who prescribes a medicine. The pharmacist who fills the prescription. What increases the risk? This condition is more likely in: Children. They may be attracted to colorful pills. Because of a child's small size, even a small amount of a medicine can be dangerous. Older people. They may be taking many different medicines. Older people may have difficulty reading labels or remembering when they last took their medicines. They may also be more sensitive to the effects of opioids. People with chronic medical conditions, especially heart, liver, kidney, or neurological diseases. People who take an opioid for a long period of time. People who take opioids and use illegal drugs, such as heroin, or other substances, such as alcohol. People who: Have a history of drug or alcohol abuse. Have certain mental health conditions. Have a history of previous drug overdoses. People who take opioids that are not prescribed for them. What are the signs  or symptoms? Symptoms of this condition depend on the type of opioid and the amount that was taken. Common symptoms include: Sleepiness or difficulty waking from sleep. Confusion. Slurred speech. Slowed breathing and a slow pulse (bradycardia). Nausea and vomiting. Abnormally small pupils. Signs and symptoms that require emergency treatment include: Cold, clammy, and pale skin. Blue lips and fingernails. Vomiting. Gurgling sounds in the throat. A pulse that is very slow or difficult to detect. Breathing that is very irregular, slow, noisy, or difficult to detect. Inability to respond to speech or be awakened from sleep (stupor). Seizures. How is this diagnosed? This condition is diagnosed based on your symptoms and medical history. It is important to tell your health care provider: About all of the opioids that you took. When you took the opioids. Whether you were drinking alcohol or using marijuana, cocaine, or other drugs. Your health care provider will do a physical exam. This exam may include: Checking and monitoring your heart rate and rhythm, breathing rate, temperature, and blood pressure. Measuring oxygen levels in your blood. Checking for abnormally small pupils. You may also have blood tests or urine tests. You may have X-rays if you are having severe breathing problems. How is this treated? This condition requires immediate medical treatment and hospitalization. Reversing the effects of the opioid is the first step in treatment. If you have a Narcan kit or naloxone, use it right away. Follow your health care provider's instructions. A friend or family member can also help you with this. The rest of your treatment will be given in the hospital intensive care (ICU). Treatment in the hospital may include: Giving salts and  minerals (electrolytes) along with fluids through an IV. Inserting a breathing tube (endotracheal tube) in your airway to help you breathe if you cannot  breathe on your own or you are in danger of not being able to breathe on your own. Giving oxygen through a small tube under your nose. Passing a tube through your nose and into your stomach (nasogastric tube, or NG tube) to empty your stomach. Giving medicines that: Increase your blood pressure. Relieve nausea and vomiting. Relieve abdominal pain and cramping. Reverse the effects of the opioid (naloxone). Monitoring your heart and oxygen levels. Ongoing counseling and mental health support if you intentionally overdosed or used an illegal drug. Follow these instructions at home:  Medicines Take over-the-counter and prescription medicines only as told by your health care provider. Always ask your health care provider about possible side effects and interactions of any new medicine that you start taking. Keep a list of all the medicines that you take, including over-the-counter medicines. Bring this list with you to all your medical visits. General instructions Drink enough fluid to keep your urine pale yellow. Keep all follow-up visits. This is important. How is this prevented? Read the drug inserts that come with your opioid pain medicines. Take medicines only as told by your health care provider. Do not take more medicine than you are told. Do not take medicines more frequently than you are told. Do not drink alcohol or take sedatives when taking opioids. Do not use illegal or recreational drugs, including cocaine, ecstasy, and marijuana. Do not take opioid medicines that are not prescribed for you. Store all medicines in safety containers that are out of the reach of children. Get help if you are struggling with: Alcohol or drug use. Depression or another mental health problem. Thoughts of hurting yourself or another person. Keep the phone number of your local poison control center near your phone or in your mobile phone. In the [place] the hotline of the [facility]  is (564)852-8970. If you were prescribed naloxone, make sure you understand how to take it. Contact a health care provider if: You need help understanding how to take your pain medicines. You feel your medicines are too strong. You are concerned that your pain medicines are not working well for your pain. You develop new symptoms or side effects when you are taking medicines. Get help right away if: You or someone else is having symptoms of an opioid overdose. Get help even if you are not sure. You have thoughts about hurting yourself or others. You have: Chest pain. Difficulty breathing. A loss of consciousness. These symptoms may represent a serious problem that is an emergency. Do not wait to see if the symptoms will go away. Get medical help right away. Call your local emergency services (911 in the [place] Do not drive yourself to the hospital. If you ever feel like you may hurt yourself or others, or have thoughts about taking your own life, get help right away. You can go to your nearest emergency department or: Call your local emergency services (911 in the [place] Call a suicide crisis helpline, such as the [facility] at (781)776-9121 or 988 in the [place]. This is open 24 hours a day in the [place] Text the Crisis Text Line at 432-224-4920 (in the [place].). Summary Opioids are drugs that are often used to treat pain. Opioids include illegal drugs, such as heroin, as well as prescription pain medicines. An opioid overdose happens when  you take too much of an opioid. Overdoses can be intentional or accidental. Opioid overdose is very dangerous. It is a life-threatening emergency. If you or someone you know is experiencing an opioid overdose, get help right away. This information is not intended to replace advice given to you by your health care provider. Make sure you discuss any questions you have with your health care provider. Document Revised: 08/29/2020 Document  Reviewed: 05/16/2020 [employer] Patient Education  [place].

## 2021-10-18 ENCOUNTER — Telehealth: Payer: Self-pay | Admitting: *Deleted

## 2021-10-18 NOTE — Telephone Encounter (Signed)
Post procedure call;  patient reports he is doing well.

## 2021-11-12 ENCOUNTER — Ambulatory Visit: Payer: Worker's Compensation | Attending: Pain Medicine | Admitting: Pain Medicine

## 2021-11-12 ENCOUNTER — Encounter: Payer: Self-pay | Admitting: Pain Medicine

## 2021-11-12 VITALS — BP 109/67 | HR 65 | Temp 97.2°F | Ht 65.0 in | Wt 242.0 lb

## 2021-11-12 DIAGNOSIS — M961 Postlaminectomy syndrome, not elsewhere classified: Secondary | ICD-10-CM | POA: Insufficient documentation

## 2021-11-12 DIAGNOSIS — Z5189 Encounter for other specified aftercare: Secondary | ICD-10-CM | POA: Insufficient documentation

## 2021-11-12 DIAGNOSIS — G894 Chronic pain syndrome: Secondary | ICD-10-CM | POA: Diagnosis present

## 2021-11-12 DIAGNOSIS — Z79899 Other long term (current) drug therapy: Secondary | ICD-10-CM | POA: Insufficient documentation

## 2021-11-12 DIAGNOSIS — Z978 Presence of other specified devices: Secondary | ICD-10-CM | POA: Diagnosis present

## 2021-11-12 DIAGNOSIS — M4326 Fusion of spine, lumbar region: Secondary | ICD-10-CM | POA: Insufficient documentation

## 2021-11-12 DIAGNOSIS — G8929 Other chronic pain: Secondary | ICD-10-CM | POA: Insufficient documentation

## 2021-11-12 DIAGNOSIS — M5137 Other intervertebral disc degeneration, lumbosacral region: Secondary | ICD-10-CM | POA: Diagnosis present

## 2021-11-12 DIAGNOSIS — M79604 Pain in right leg: Secondary | ICD-10-CM | POA: Insufficient documentation

## 2021-11-12 DIAGNOSIS — R937 Abnormal findings on diagnostic imaging of other parts of musculoskeletal system: Secondary | ICD-10-CM | POA: Diagnosis present

## 2021-11-12 DIAGNOSIS — Z451 Encounter for adjustment and management of infusion pump: Secondary | ICD-10-CM | POA: Diagnosis present

## 2021-11-12 DIAGNOSIS — M5441 Lumbago with sciatica, right side: Secondary | ICD-10-CM | POA: Insufficient documentation

## 2021-11-12 MED FILL — Medication: INTRATHECAL | Qty: 1 | Status: AC

## 2021-11-12 NOTE — Progress Notes (Signed)
PROVIDER NOTE: Interpretation of information contained herein should be left to medically-trained personnel. Specific patient instructions are provided elsewhere under "Patient Instructions" section of medical record. This document was created in part using STT-dictation technology, any transcriptional errors that may result from this process are unintentional.  Patient: Brian Spencer Type: Established DOB: 1950/10/19 MRN: [id number] P[facility]: [facility]  Service: Procedure DOS: 11/12/2021 Setting: Ambulatory Location: Ambulatory outpatient facility Delivery: Face-to-face Provider: Gaspar Cola, MD Specialty: Interventional Pain Management Specialty designation: 09 Location: Outpatient facility Ref. Prov.: [place]    Primary Reason for Visit: Interventional Pain Management Treatment. CC: Back Pain (lower)  Procedure:          Type: Management of Intrathecal Drug Delivery System (IDDS) - Reservoir Refill [phone]). No rate change.  Indications: 1. Chronic pain syndrome   2. Chronic low back pain (1ry area of Pain) (Bilateral) (R>L)   3. Chronic lower extremity pain (2ry area of Pain) (Right)   4. Failed back surgical syndrome   5. DDD (degenerative disc disease), lumbosacral   6. Fusion of lumbar spine   7. Presence of intrathecal pump   8. Encounter for adjustment or management of infusion pump   9. Encounter for therapeutic procedure   10. Pharmacologic therapy   11. Encounter for medication management   12. Encounter for chronic pain management   13. Abnormal CT scan, cervical spine (09/27/2021)   14. Abnormal CT of thoracic spine (09/27/2021)   15. Abnormal CT scan, lumbar spine (09/27/2021)    Pain Assessment: Self-Reported Pain Score: 7 /10             Reported level is inconsistent with clinical observations. Information on the proper use of the pain score provided to the patient today.  Based on our pain scale, his pain is probably at a 3 level.  Today we are  making a change in the concentration of the medication.  We are decreasing the concentration of the bupivacaine from 30 mg/mL to 20 mg/mL so that we are able to continue increasing his dose without running into significant weakness.  Today he refers that his leg pain is okay, but he still having pain in the lower back.  Today he asked me if on the next refill we could go back to the prior medication, but that does not make any sense since we have not even tried the current medicine.  I do not think that he understands how the intrathecal pump works.  Today I have informed the patient that I will hold on doing any further changes on the medication since we are not really allowing it to work.  I have also explained to the patient that the issue with the low back pain and it not improving may be a function of the placement of the catheter as opposed to the medication that we are administering.  Today the patient asked me about the results of his CT scan, but upon reviewing his chart there is absolutely no reports for any CT scans in either the [facility] system or any of the affiliated hospitals through "[facility]".    Intrathecal Drug Delivery System (IDDS)  Pump Device:  Manufacturer: Medtronic Model: Synchromed II Model No.: [id number] Serial No.: [id number] H Delivery Route: Intrathecal Type: Programmable  Volume (mL): 40 mL reservoir Priming Volume: 0.207 mL  Calibration Constant: 117.0  MRI compatibility: Conditional   Implant Details:  Date: 10/08/20 (Third Pump) Implanter: Dr. Deetta Perla (Last)  Contact Information: [facility],  [place], [place] Last Revision/Replacement: 10/08/2020 Estimated Replacement Date: May 2029  Implant Site: Abdominal Laterality: Right  Catheter: Manufacturer: Medtronic Model:  n/a  Model No.: [id number]  Serial No.: n/a  Implanted Length (cm): 94.1  Catheter Volume (mL): 0.207  Tip Location (Level): T10 Canal Access Site: L2-3  Drug content:   Primary Medication Class: Opioid analgesic Medication: PF-Fentanyl Concentration: 2000 mcg/mL   Secondary Medication Class: Local anesthetic Medication: PF-bupivacaine Concentration: 20 mg/mL  Tertiary Medication Class: Nonopioid analgesic Medication: PF-clonidine Concentration: 10 mcg/mL  Fourth Medication Class: none  Medication: n/a  Concentration: n/a  PTC/PCA parameters:  Mode: Off/Inactive  Programming:  Type: Simple continuous.  Medication, Concentration, Infusion Program, & Delivery Rate: For up-to-date details please see most recent scanned programming printout.      Changes:  Medication Change:  Today we have decreased the concentration on the bupivacaine portion of the mixture from 30 mg/mL to 20 mg/mL. Rate Change: No change in rate based on the primary medication (fentanyl) rate  Reported side-effects or adverse reactions: None reported  Effectiveness: Described as relatively effective, allowing for increase in activities of daily living (ADL) Clinically meaningful improvement in function (CMIF): Sustained CMIF goals met  Plan: Pump refill today   Pharmacotherapy Assessment   Opioid Analgesic: Intrathecal Hydromorphone (Dilaudid) @ 2.399 mg/d. MME: 9.6 mg/day.   Monitoring: [place] PMP: PDMP reviewed during this encounter.       Pharmacotherapy: No side-effects or adverse reactions reported. Compliance: No problems identified. Effectiveness: Clinically acceptable. Plan: Refer to "POC". UDS:  Summary  Date Value Ref Range Status  11/12/2015 FINAL  Final    Comment:    ==================================================================== TOXASSURE COMP DRUG ANALYSIS,UR ==================================================================== Test                             Result       Flag       Units Drug Present and Declared for Prescription Verification   7-aminoclonazepam              82           EXPECTED   ng/mg creat    7-aminoclonazepam is an  expected metabolite of clonazepam. Source    of clonazepam is a scheduled prescription medication.   Trazodone                      PRESENT      EXPECTED   1,3 chlorophenyl piperazine    PRESENT      EXPECTED    1,3-chlorophenyl piperazine is an expected metabolite of    trazodone.   Verapamil                      PRESENT      EXPECTED Drug Present not Declared for Prescription Verification   Hydromorphone                  1656         UNEXPECTED ng/mg creat    Hydromorphone may be administered as a scheduled prescription    medication; it is also an expected metabolite of hydrocodone. Drug Absent but Declared for Prescription Verification   Venlafaxine                    Not Detected UNEXPECTED ==================================================================== Test  Result    Flag   Units      Ref Range   Creatinine              73               mg/dL      >=20 ==================================================================== Declared Medications:  The flagging and interpretation on this report are based on the  following declared medications.  Unexpected results may arise from  inaccuracies in the declared medications.  **Note: The testing scope of this panel includes these medications:  Clonazepam (Klonopin)  Trazodone (Desyrel)  Venlafaxine (Effexor)  Verapamil (Calan)  **Note: The testing scope of this panel does not include following  reported medications:  Albuterol  Budenoside (Symbicort)  Chlorthalidone  Formoterol (Symbicort)  Multivitamin  Tamsulosin (Flomax)  Tiotropium (Spiriva)  Ubiquinone (Coenzyme Q 10)  Vitamin D2 (Ergocalciferol) ==================================================================== For clinical consultation, please call [phone]. ====================================================================    No results found for: "CBDTHCR", "D8THCCBX", "D9THCCBX"   Pre-op H&P Assessment:  Mr. Urquilla is a 71 y.o.  (year old), male patient, seen today for interventional treatment. 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 Mr. Chance has a current medication list which includes the following prescription(s): acetaminophen, albuterol, albuterol, amlodipine, apixaban, atorvastatin, clonazepam, neuriva plus, magnesium oxide, metformin, multivitamin, naloxone, NON FORMULARY, ondansetron, oxygen-helium, pantoprazole, polyvinyl alcohol, potassium chloride sa, propranolol, tamsulosin, tiotropium bromide-olodaterol, torsemide, trazodone, and venlafaxine xr. His primarily concern today is the Back Pain (lower)  Initial Vital Signs:  Pulse/HCG Rate: 65  Temp: (!) 97.2 F (36.2 C) Resp:   BP: 109/67 SpO2: 92 % (3 liters)  BMI: Estimated body mass index is 40.27 kg/m as calculated from the following:   Height as of this encounter: 5' 5"  (1.651 m).   Weight as of this encounter: 242 lb (109.8 kg).  Risk Assessment: Allergies: Reviewed. He is allergic to baclofen and hydralazine.  Allergy Precautions: None required Coagulopathies: Reviewed. None identified.  Blood-thinner therapy: None at this time Active Infection(s):  Reviewed. None identified. Mr. Gruszka is afebrile  Site Confirmation: Mr. Tindol was asked to confirm the procedure and laterality before marking the site Procedure checklist: Completed Consent: Before the procedure and under the influence of no sedative(s), amnesic(s), or anxiolytics, the patient was informed of the treatment options, risks and possible complications. To fulfill our ethical and legal obligations, as recommended by the American Medical Association's Code of Ethics, I have informed the patient of my clinical impression; the nature and purpose of the treatment or procedure; the risks, benefits, and possible complications of the intervention; the alternatives, including doing nothing; the risk(s) and benefit(s) of the alternative treatment(s) or procedure(s); and the risk(s) and benefit(s) of doing nothing.  Mr. Kemler was provided with information about the general risks and possible complications associated with most interventional procedures. These include, but are not limited to: failure to achieve desired goals, infection, bleeding, organ or nerve  damage, allergic reactions, paralysis, and/or death.  In addition, he was informed of those risks and possible complications associated to this particular procedure, which include, but are not limited to: damage to the implant; failure to decrease pain; local, systemic, or serious CNS infections, intraspinal abscess with possible cord compression and paralysis, or life-threatening such as meningitis; bleeding; organ damage; nerve injury or damage with subsequent sensory, motor, and/or autonomic system dysfunction, resulting in transient or permanent pain, numbness, and/or weakness of one or several areas of the body; allergic reactions, either minor or major life-threatening, such as anaphylactic or anaphylactoid reactions.  Furthermore, Mr. Nazaire was informed of those risks and complications associated with the medications. These include, but are  not limited to: allergic reactions (i.e.: anaphylactic or anaphylactoid reactions); endorphine suppression; bradycardia and/or hypotension; water retention and/or peripheral vascular relaxation leading to lower extremity edema and possible stasis ulcers; respiratory depression and/or shortness of breath; decreased metabolic rate leading to weight gain; swelling or edema; medication-induced neural toxicity; particulate matter embolism and blood vessel occlusion with resultant organ, and/or nervous system infarction; and/or intrathecal granuloma formation with possible spinal cord compression and permanent paralysis.  Before refilling the pump Mr. Turay was informed that some of the medications used in the devise may not be FDA approved for such use and therefore it constitutes an off-label use of the medications.  Finally, he was informed that Medicine is not an exact science; therefore, there is also the possibility of unforeseen or unpredictable risks and/or possible complications that may result in a catastrophic outcome. The patient indicated having understood very clearly. We have given the patient no guarantees and we have made no promises. Enough time was given to the patient to ask questions, all of which were answered to the patient's satisfaction. Mr. Heyward has indicated that he wanted to continue with the procedure. Attestation: I, the ordering provider, attest that I have discussed with the patient the benefits, risks, side-effects, alternatives, likelihood of achieving goals, and potential problems during recovery for the procedure that I have provided informed consent. Date  Time: 11/12/2021 12:23 PM  Pre-Procedure Preparation:  Monitoring: As per clinic protocol. Respiration, ETCO2, SpO2, BP, heart rate and rhythm monitor placed and checked for adequate function Safety Precautions: Patient was assessed for positional comfort and pressure points before starting the procedure. Time-out: I  initiated and conducted the "Time-out" before starting the procedure, as per protocol. The patient was asked to participate by confirming the accuracy of the "Time Out" information. Verification of the correct person, site, and procedure were performed and confirmed by me, the nursing staff, and the patient. "Time-out" conducted as per Joint Commission's Universal Protocol (UP.01.01.01). Time: 1225  Description of Procedure:          Position: Supine Target Area: Central-port of intrathecal pump. Approach: Anterior, 90 degree angle approach. Area Prepped: Entire Area around the pump implant. DuraPrep (Iodine Povacrylex [0.7% available iodine] and Isopropyl Alcohol, 74% w/w) Safety Precautions: Aspiration looking for blood return was conducted prior to all injections. At no point did we inject any substances, as a needle was being advanced. No attempts were made at seeking any paresthesias. Safe injection practices and needle disposal techniques used. Medications properly checked for expiration dates. SDV (single dose vial) medications used. Description of the Procedure: Protocol guidelines were followed. Two nurses trained to do implant refills were present during the entire procedure. The refill medication was checked by both healthcare providers as well as the patient. The patient was included in  the "Time-out" to verify the medication. The patient was placed in position. The pump was identified. The area was prepped in the usual manner. The sterile template was positioned over the pump, making sure the side-port location matched that of the pump. Both, the pump and the template were held for stability. The needle provided in the Medtronic Kit was then introduced thru the center of the template and into the central port. The pump content was aspirated and discarded volume documented. The new medication was slowly infused into the pump, thru the filter, making sure to avoid overpressure of the device. The  needle was then removed and the area cleansed, making sure to leave some of the prepping solution back to take advantage of its long term bactericidal properties. The pump was interrogated and programmed to reflect the correct medication, volume, and dosage. The program was printed and taken to the physician for approval. Once checked and signed by the physician, a copy was provided to the patient and another scanned into the EMR.  Vitals:   11/12/21 1220  BP: 109/67  Pulse: 65  Temp: (!) 97.2 F (36.2 C)  SpO2: 92%  Weight: 242 lb (109.8 kg)  Height: 5' 5"  (1.651 m)    Start Time: 1225 hrs. End Time: 1245 hrs. Materials & Medications: Medtronic Refill Kit Medication(s): Please see chart orders for details.  Imaging Guidance:          Type of Imaging Technique: None used Indication(s): N/A Exposure Time: No patient exposure Contrast: None used. Fluoroscopic Guidance: N/A Ultrasound Guidance: N/A Interpretation: N/A  Antibiotic Prophylaxis:   Anti-infectives (From admission, onward)    None      Indication(s): None identified  Post-operative Assessment:  Post-procedure Vital Signs:  Pulse/HCG Rate: 65  Temp: (!) 97.2 F (36.2 C) Resp:   BP: 109/67 SpO2: 92 % (3 liters)  EBL: None  Complications: No immediate post-treatment complications observed by team, or reported by patient.  Note: The patient tolerated the entire procedure well. A repeat set of vitals were taken after the procedure and the patient was kept under observation following institutional policy, for this type of procedure. Post-procedural neurological assessment was performed, showing return to baseline, prior to discharge. The patient was provided with post-procedure discharge instructions, including a section on how to identify potential problems. Should any problems arise concerning this procedure, the patient was given instructions to immediately contact us, at any time, without hesitation. In any  case, we plan to contact the patient by telephone for a follow-up status report regarding this interventional procedure.  Comments:  No additional relevant information.  Plan of Care  Orders:  Orders Placed This Encounter  Procedures   PUMP REFILL    Maintain Protocol by having two(2) healthcare providers during procedure and programming.    Scheduling Instructions:     Please refill intrathecal pump today.    Order Specific Question:   Where will this procedure be performed?    Answer:   [facility]   PUMP REFILL    Whenever possible schedule on a procedure today.    Standing Status:   Future    Standing Expiration Date:   03/14/2022    Scheduling Instructions:     Please schedule intrathecal pump refill based on pump programming. Avoid schedule intervals of more than 120 days (4 months).    Order Specific Question:   Where will this procedure be performed?    Answer:   [facility]   Informed  Consent Details: Physician/Practitioner Attestation; Transcribe to consent form and obtain patient signature    Transcribe to consent form and obtain patient signature.    Order Specific Question:   Physician/Practitioner attestation of informed consent for procedure/surgical case    Answer:   I, the physician/practitioner, attest that I have discussed with the patient the benefits, risks, side effects, alternatives, likelihood of achieving goals and potential problems during recovery for the procedure that I have provided informed consent.    Order Specific Question:   Procedure    Answer:   Intrathecal pump refill    Order Specific Question:   Physician/Practitioner performing the procedure    Answer:   Attending Physician: Kathlen Brunswick. Dossie Arbour, MD & designated trained staff    Order Specific Question:   Indication/Reason    Answer:   Chronic Pain Syndrome (G89.4), presence of an intrathecal pump (Z97.8)   Chronic Opioid Analgesic:  Intrathecal Hydromorphone (Dilaudid) @  2.399 mg/d. MME: 9.6 mg/day.   Medications ordered for procedure: No orders of the defined types were placed in this encounter.  Medications administered: Ras Kollman had no medications administered during this visit.  See the medical record for exact dosing, route, and time of administration.  Follow-up plan:   Return for Pump Refill (Max:29mo.       Interventional Therapies  Risk  Complexity Considerations:   Estimated body mass index is 40.77 kg/m as calculated from the following:   Height as of this encounter: 5' 5"  (1.651 m).   Weight as of this encounter: 245 lb (111.1 kg). NOTE: ELIQUIS ANTICOAGULATION (Stop: 3days  Re-start: 6 hrs)   Planned  Pending:   (10/17/2021) we plan to decrease the concentration of his intrathecal bupivacaine from 30 mg/mL to 20 mg/mL on his next refill.   Under consideration:   Continue management of intrathecal pump  Diagnostic bilateral lumbar facet blocks #1    Completed:   None at this time   Therapeutic  Palliative (PRN) options:   Continue management of intrathecal pump      Recent Visits Date Type Provider Dept  10/17/21 Procedure visit NMilinda Pointer MD [facility]  09/03/21 Procedure visit NMilinda Pointer MD [facility]  08/27/21 Procedure visit NMilinda Pointer MD [facility]  08/21/21 Procedure visit NMilinda Pointer MD [facility]  08/15/21 Procedure visit NMilinda Pointer MD [facility]  08/14/21 Procedure visit NMilinda Pointer MD [facility]  Showing recent visits within past 90 days and meeting all other requirements Today's Visits Date Type Provider Dept  11/12/21 Procedure visit NMilinda Pointer MD [facility]  Showing today's visits and meeting all other requirements Future Appointments Date Type Provider Dept  01/21/22 Appointment NMilinda Pointer MD [facility]  Showing future appointments within next 90  days and meeting all other requirements  Disposition: Discharge home  Discharge (Date  Time): 11/12/2021; 1255 hrs.   Primary Care Physician[facility] Note by: FGaspar Cola MD Date: 11/12/2021; Time: 1:35 PM  Disclaimer:  Medicine is not an eChief Strategy Officer The only guarantee in medicine is that nothing is guaranteed. It is important to note that the decision to proceed with this intervention was based on the information collected from the patient. The Data and conclusions were drawn from the patient's questionnaire, the interview, and the physical examination. Because the information was provided in large part by the patient, it cannot be guaranteed that it has not been purposely  or unconsciously manipulated. Every effort has been made to obtain as much relevant data as possible for this evaluation. It is important to note that the conclusions that lead to this procedure are derived in large part from the available data. Always take into account that the treatment will also be dependent on availability of resources and existing treatment guidelines, considered by other Pain Management Practitioners as being common knowledge and practice, at the time of the intervention. For Medico-Legal purposes, it is also important to point out that variation in procedural techniques and pharmacological choices are the acceptable norm. The indications, contraindications, technique, and results of the above procedure should only be interpreted and judged by a Board-Certified Interventional Pain Specialist with extensive familiarity and expertise in the same exact procedure and technique.

## 2021-11-12 NOTE — Patient Instructions (Addendum)
Patient has Narcan at home ______________________________________________________________________________________________  Specialty Pain Scale  Introduction:  There are significant differences in how pain is reported. The word pain usually refers to physical pain, but it is also a common synonym of suffering. The medical community uses a scale from 0 (zero) to 10 (ten) to report pain level. Zero (0) is described as "no pain", while ten (10) is described as "the worse pain you can imagine". The problem with this scale is that physical pain is reported along with suffering. Suffering refers to mental pain, or more often yet it refers to any unpleasant feeling, emotion or aversion associated with the perception of harm or threat of harm. It is the psychological component of pain.  Pain Specialists prefer to separate the two components. The pain scale used by this practice is the Verbal Numerical Rating Scale (VNRS-11). This scale is for the physical pain only. DO NOT INCLUDE how your pain psychologically affects you. This scale is for adults 58 years of age and older. It has 11 (eleven) levels. The 1st level is 0/10. This means: "right now, I have no pain". In the context of pain management, it also means: "right now, my physical pain is under control with the current therapy".  General Information:  The scale should reflect your current level of pain. Unless you are specifically asked for the level of your worst pain, or your average pain. If you are asked for one of these two, then it should be understood that it is over the past 24 hours.  Levels 1 (one) through 5 (five) are described below, and can be treated as an outpatient. Ambulatory pain management facilities such as ours are more than adequate to treat these levels. Levels 6 (six) through 10 (ten) are also described below, however, these must be treated as a hospitalized patient. While levels 6 (six) and 7 (seven) may be evaluated at an urgent  care facility, levels 8 (eight) through 10 (ten) constitute medical emergencies and as such, they belong in a hospital's emergency department. When having these levels (as described below), do not come to our office. Our facility is not equipped to manage these levels. Go directly to an urgent care facility or an emergency department to be evaluated.  Definitions:  Activities of Daily Living (ADL): Activities of daily living (ADL or ADLs) is a term used in healthcare to refer to people's daily self-care activities. Health professionals often use a person's ability or inability to perform ADLs as a measurement of their functional status, particularly in regard to people post injury, with disabilities and the elderly. There are two ADL levels: Basic and Instrumental. Basic Activities of Daily Living (BADL  or BADLs) consist of self-care tasks that include: Bathing and showering; personal hygiene and grooming (including brushing/combing/styling hair); dressing; Toilet hygiene (getting to the toilet, cleaning oneself, and getting back up); eating and self-feeding (not including cooking or chewing and swallowing); functional mobility, often referred to as "transferring", as measured by the ability to walk, get in and out of bed, and get into and out of a chair; the broader definition (moving from one place to another while performing activities) is useful for people with different physical abilities who are still able to get around independently. Basic ADLs include the things many people do when they get up in the morning and get ready to go out of the house: get out of bed, go to the toilet, bathe, dress, groom, and eat. On the average, loss of function  typically follows a particular order. Hygiene is the first to go, followed by loss of toilet use and locomotion. The last to go is the ability to eat. When there is only one remaining area in which the person is independent, there is a 62.9% chance that it is eating  and only a 3.5% chance that it is hygiene. Instrumental Activities of Daily Living (IADL or IADLs) are not necessary for fundamental functioning, but they let an individual live independently in a community. IADL consist of tasks that include: cleaning and maintaining the house; home establishment and maintenance; care of others (including selecting and supervising caregivers); care of pets; child rearing; managing money; managing financials (investments, etc.); meal preparation and cleanup; shopping for groceries and necessities; moving within the community; safety procedures and emergency responses; health management and maintenance (taking prescribed medications); and using the telephone or other form of communication.  Instructions:  Most patients tend to report their pain as a combination of two factors, their physical pain and their psychosocial pain. This last one is also known as "suffering" and it is reflection of how physical pain affects you socially and psychologically. From now on, report them separately.  From this point on, when asked to report your pain level, report only your physical pain. Use the following table for reference.  Pain Clinic Pain Levels (0-5/10)  Pain Level Score  Description  No Pain 0   Mild pain 1 Nagging, annoying, but does not interfere with basic activities of daily living (ADL). Patients are able to eat, bathe, get dressed, toileting (being able to get on and off the toilet and perform personal hygiene functions), transfer (move in and out of bed or a chair without assistance), and maintain continence (able to control bladder and bowel functions). Blood pressure and heart rate are unaffected. A normal heart rate for a healthy adult ranges from 60 to 100 bpm (beats per minute).   Mild to moderate pain 2 Noticeable and distracting. Impossible to hide from other people. More frequent flare-ups. Still possible to adapt and function close to normal. It can be very  annoying and may have occasional stronger flare-ups. With discipline, patients may get used to it and adapt.   Moderate pain 3 Interferes significantly with activities of daily living (ADL). It becomes difficult to feed, bathe, get dressed, get on and off the toilet or to perform personal hygiene functions. Difficult to get in and out of bed or a chair without assistance. Very distracting. With effort, it can be ignored when deeply involved in activities.   Moderately severe pain 4 Impossible to ignore for more than a few minutes. With effort, patients may still be able to manage work or participate in some social activities. Very difficult to concentrate. Signs of autonomic nervous system discharge are evident: dilated pupils (mydriasis); mild sweating (diaphoresis); sleep interference. Heart rate becomes elevated (>115 bpm). Diastolic blood pressure (lower number) rises above 100 mmHg. Patients find relief in laying down and not moving.   Severe pain 5 Intense and extremely unpleasant. Associated with frowning face and frequent crying. Pain overwhelms the senses.  Ability to do any activity or maintain social relationships becomes significantly limited. Conversation becomes difficult. Pacing back and forth is common, as getting into a comfortable position is nearly impossible. Pain wakes you up from deep sleep. Physical signs will be obvious: pupillary dilation; increased sweating; goosebumps; brisk reflexes; cold, clammy hands and feet; nausea, vomiting or dry heaves; loss of appetite; significant sleep disturbance with inability  to fall asleep or to remain asleep. When persistent, significant weight loss is observed due to the complete loss of appetite and sleep deprivation.  Blood pressure and heart rate becomes significantly elevated. Caution: If elevated blood pressure triggers a pounding headache associated with blurred vision, then the patient should immediately seek attention at an urgent or  emergency care unit, as these may be signs of an impending stroke.    Emergency Department Pain Levels (6-10/10)  Emergency Room Pain 6 Severely limiting. Requires emergency care and should not be seen or managed at an outpatient pain management facility. Communication becomes difficult and requires great effort. Assistance to reach the emergency department may be required. Facial flushing and profuse sweating along with potentially dangerous increases in heart rate and blood pressure will be evident.   Distressing pain 7 Self-care is very difficult. Assistance is required to transport, or use restroom. Assistance to reach the emergency department will be required. Tasks requiring coordination, such as bathing and getting dressed become very difficult.   Disabling pain 8 Self-care is no longer possible. At this level, pain is disabling. The individual is unable to do even the most "basic" activities such as walking, eating, bathing, dressing, transferring to a bed, or toileting. Fine motor skills are lost. It is difficult to think clearly.   Incapacitating pain 9 Pain becomes incapacitating. Thought processing is no longer possible. Difficult to remember your own name. Control of movement and coordination are lost.   The worst pain imaginable 10 At this level, most patients pass out from pain. When this level is reached, collapse of the autonomic nervous system occurs, leading to a sudden drop in blood pressure and heart rate. This in turn results in a temporary and dramatic drop in blood flow to the brain, leading to a loss of consciousness. Fainting is one of the body's self defense mechanisms. Passing out puts the brain in a calmed state and causes it to shut down for a while, in order to begin the healing process.    Summary: 1.   Refer to this scale when providing [place] with your pain level. 2.   Be accurate and careful when reporting your pain level. This will help with your care. 3.    Over-reporting your pain level will lead to loss of credibility. 4.   Even a level of 1/10 means that there is pain and will be treated at our facility. 5.   High, inaccurate reporting will be documented as "Symptom Exaggeration", leading to loss of credibility and suspicions of possible secondary gains such as obtaining more narcotics, or wanting to appear disabled, for fraudulent reasons. 6.   Only pain levels of 5 or below will be seen at our facility. 7.   Pain levels of 6 and above will be sent to the Emergency Department and the appointment cancelled.  ___________________________________________________________________________________________   Opioid Overdose Opioids are drugs that are often used to treat pain. Opioids include illegal drugs, such as heroin, as well as prescription pain medicines, such as codeine, morphine, hydrocodone, and fentanyl. An opioid overdose happens when you take too much of an opioid. An overdose may be intentional or accidental and can happen with any type of opioid. The effects of an overdose can be mild, dangerous, or even deadly. Opioid overdose is a medical emergency. What are the causes? This condition may be caused by: Taking too much of an opioid on purpose. Taking too much of an opioid by accident. Using two or more substances  that contain opioids at the same time. Taking an opioid with a substance that affects your heart, breathing, or blood pressure. These include alcohol, tranquilizers, sleeping pills, illegal drugs, and some over-the-counter medicines. This condition may also happen due to an error made by: A health care provider who prescribes a medicine. The pharmacist who fills the prescription. What increases the risk? This condition is more likely in: Children. They may be attracted to colorful pills. Because of a child's small size, even a small amount of a medicine can be dangerous. Older people. They may be taking many different  medicines. Older people may have difficulty reading labels or remembering when they last took their medicines. They may also be more sensitive to the effects of opioids. People with chronic medical conditions, especially heart, liver, kidney, or neurological diseases. People who take an opioid for a long period of time. People who take opioids and use illegal drugs, such as heroin, or other substances, such as alcohol. People who: Have a history of drug or alcohol abuse. Have certain mental health conditions. Have a history of previous drug overdoses. People who take opioids that are not prescribed for them. What are the signs or symptoms? Symptoms of this condition depend on the type of opioid and the amount that was taken. Common symptoms include: Sleepiness or difficulty waking from sleep. Confusion. Slurred speech. Slowed breathing and a slow pulse (bradycardia). Nausea and vomiting. Abnormally small pupils. Signs and symptoms that require emergency treatment include: Cold, clammy, and pale skin. Blue lips and fingernails. Vomiting. Gurgling sounds in the throat. A pulse that is very slow or difficult to detect. Breathing that is very irregular, slow, noisy, or difficult to detect. Inability to respond to speech or be awakened from sleep (stupor). Seizures. How is this diagnosed? This condition is diagnosed based on your symptoms and medical history. It is important to tell your health care provider: About all of the opioids that you took. When you took the opioids. Whether you were drinking alcohol or using marijuana, cocaine, or other drugs. Your health care provider will do a physical exam. This exam may include: Checking and monitoring your heart rate and rhythm, breathing rate, temperature, and blood pressure. Measuring oxygen levels in your blood. Checking for abnormally small pupils. You may also have blood tests or urine tests. You may have X-rays if you are having  severe breathing problems. How is this treated? This condition requires immediate medical treatment and hospitalization. Reversing the effects of the opioid is the first step in treatment. If you have a Narcan kit or naloxone, use it right away. Follow your health care provider's instructions. A friend or family member can also help you with this. The rest of your treatment will be given in the hospital intensive care (ICU). Treatment in the hospital may include: Giving salts and minerals (electrolytes) along with fluids through an IV. Inserting a breathing tube (endotracheal tube) in your airway to help you breathe if you cannot breathe on your own or you are in danger of not being able to breathe on your own. Giving oxygen through a small tube under your nose. Passing a tube through your nose and into your stomach (nasogastric tube, or NG tube) to empty your stomach. Giving medicines that: Increase your blood pressure. Relieve nausea and vomiting. Relieve abdominal pain and cramping. Reverse the effects of the opioid (naloxone). Monitoring your heart and oxygen levels. Ongoing counseling and mental health support if you intentionally overdosed or used an illegal  drug. Follow these instructions at home:  Medicines Take over-the-counter and prescription medicines only as told by your health care provider. Always ask your health care provider about possible side effects and interactions of any new medicine that you start taking. Keep a list of all the medicines that you take, including over-the-counter medicines. Bring this list with you to all your medical visits. General instructions Drink enough fluid to keep your urine pale yellow. Keep all follow-up visits. This is important. How is this prevented? Read the drug inserts that come with your opioid pain medicines. Take medicines only as told by your health care provider. Do not take more medicine than you are told. Do not take medicines  more frequently than you are told. Do not drink alcohol or take sedatives when taking opioids. Do not use illegal or recreational drugs, including cocaine, ecstasy, and marijuana. Do not take opioid medicines that are not prescribed for you. Store all medicines in safety containers that are out of the reach of children. Get help if you are struggling with: Alcohol or drug use. Depression or another mental health problem. Thoughts of hurting yourself or another person. Keep the phone number of your local poison control center near your phone or in your mobile phone. In the [place] the hotline of the [facility] is (313)258-9070. If you were prescribed naloxone, make sure you understand how to take it. Contact a health care provider if: You need help understanding how to take your pain medicines. You feel your medicines are too strong. You are concerned that your pain medicines are not working well for your pain. You develop new symptoms or side effects when you are taking medicines. Get help right away if: You or someone else is having symptoms of an opioid overdose. Get help even if you are not sure. You have thoughts about hurting yourself or others. You have: Chest pain. Difficulty breathing. A loss of consciousness. These symptoms may represent a serious problem that is an emergency. Do not wait to see if the symptoms will go away. Get medical help right away. Call your local emergency services (911 in the [place] Do not drive yourself to the hospital. If you ever feel like you may hurt yourself or others, or have thoughts about taking your own life, get help right away. You can go to your nearest emergency department or: Call your local emergency services (911 in the [place] Call a suicide crisis helpline, such as the [place] at 6068158104 or 988 in the [place]. This is open 24 hours a day in the [place] Text the Crisis Text Line at 276-549-1400 (in  the [place].). Summary Opioids are drugs that are often used to treat pain. Opioids include illegal drugs, such as heroin, as well as prescription pain medicines. An opioid overdose happens when you take too much of an opioid. Overdoses can be intentional or accidental. Opioid overdose is very dangerous. It is a life-threatening emergency. If you or someone you know is experiencing an opioid overdose, get help right away. This information is not intended to replace advice given to you by your health care provider. Make sure you discuss any questions you have with your health care provider. Document Revised: 08/29/2020 Document Reviewed: 05/16/2020 Elsevier Patient Education  [place].

## 2021-11-13 ENCOUNTER — Telehealth: Payer: Self-pay

## 2021-11-13 NOTE — Telephone Encounter (Signed)
Post procedure phone call. Patient states he is doing well.  

## 2021-12-05 ENCOUNTER — Ambulatory Visit: Payer: Worker's Compensation | Admitting: Pain Medicine

## 2021-12-16 ENCOUNTER — Telehealth: Payer: Self-pay | Admitting: Pain Medicine

## 2021-12-16 NOTE — Telephone Encounter (Signed)
PT wants to see if he can come in on Thursday for an pump adjustment. I notify patient that doctor will be out. PT wanted to know if the nurse can adjust pump without having the doctor in office. Please give patient a call. Thanks

## 2021-12-16 NOTE — Telephone Encounter (Signed)
He can come in [place] or Wed for a increase.

## 2022-01-08 ENCOUNTER — Other Ambulatory Visit: Payer: Self-pay

## 2022-01-08 MED ORDER — PAIN MANAGEMENT IT PUMP REFILL: 1.0000 | Freq: Once | INTRATHECAL | 0 refills | Status: AC

## 2022-01-20 NOTE — Progress Notes (Unsigned)
PROVIDER NOTE: Interpretation of information contained herein should be left to medically-trained personnel. Specific patient instructions are provided elsewhere under "Patient Instructions" section of medical record. This document was created in part using STT-dictation technology, any transcriptional errors that may result from this process are unintentional.  Patient: Brian Spencer Type: Established DOB: [birth year] MRN: [id number] PCP: [facility]  Service: Procedure DOS: 01/21/2022 Setting: Ambulatory Location: Ambulatory outpatient facility Delivery: Face-to-face Provider: Gaspar Cola, MD Specialty: Interventional Pain Management Specialty designation: 09 Location: Outpatient facility Ref. Prov.: [facility]    Primary Reason for Visit: Interventional Pain Management Treatment. CC: No chief complaint on file.  Procedure:          Type: Management of Intrathecal Drug Delivery System (IDDS) - Reservoir Refill [phone]). No rate change.  Indications: 1. Chronic pain syndrome   2. Chronic low back pain (1ry area of Pain) (Bilateral) (R>L)   3. Chronic lower extremity pain (2ry area of Pain) (Right)   4. Failed back surgical syndrome   5. DDD (degenerative disc disease), lumbosacral   6. Fusion of lumbar spine   7. Presence of intrathecal pump   8. Encounter for adjustment or management of infusion pump   9. Encounter for therapeutic procedure   10. Pharmacologic therapy   11. Encounter for medication management   12. Encounter for chronic pain management    Pain Assessment: Self-Reported Pain Score:  /10             Reported level is compatible with observation.         Intrathecal Drug Delivery System (IDDS)  Pump Device:  Manufacturer: Medtronic Model: Synchromed II Model No.: [id number] Serial No.: [id number] H Delivery Route: Intrathecal Type: Programmable  Volume (mL): 40 mL reservoir Priming Volume: 0.207 mL  Calibration Constant: 117.0  MRI  compatibility: Conditional   Implant Details:  Date: 10/08/20 (Third Pump) Implanter: Dr. Deetta Perla (Last)  Contact Information: [facility], [place], [place] Last Revision/Replacement: 10/08/2020 Estimated Replacement Date: May 2029  Implant Site: Abdominal Laterality: Right  Catheter: Manufacturer: Medtronic Model:  n/a  Model No.: [id number]  Serial No.: n/a  Implanted Length (cm): 94.1  Catheter Volume (mL): 0.207  Tip Location (Level): T10 Canal Access Site: L2-3  Drug content:  Primary Medication Class: Opioid analgesic Medication: PF-Fentanyl Concentration: 2000 mcg/mL   Secondary Medication Class: Local anesthetic Medication: PF-bupivacaine Concentration: 20 mg/mL  Tertiary Medication Class: Nonopioid analgesic Medication: PF-clonidine Concentration: 10 mcg/mL  Fourth Medication Class: none  Medication: n/a  Concentration: n/a  PTC/PCA parameters:  Mode: Off/Inactive  Programming:  Type: Simple continuous.  Medication, Concentration, Infusion Program, & Delivery Rate: For up-to-date details please see most recent scanned programming printout.       Changes:  Medication Change: None at this point Rate Change: No change in rate  Reported side-effects or adverse reactions: None reported  Effectiveness: Described as relatively effective, allowing for increase in activities of daily living (ADL) Clinically meaningful improvement in function (CMIF): Sustained CMIF goals met  Plan: Pump refill today   Pharmacotherapy Assessment   Opioid Analgesic: Intrathecal Hydromorphone (Dilaudid) @ 2.399 mg/d. MME: 9.6 mg/day.   Monitoring:  PMP: PDMP reviewed during this encounter.       Pharmacotherapy: No side-effects or adverse reactions reported. Compliance: No problems identified. Effectiveness: Clinically acceptable. Plan: Refer to "POC". UDS:  Summary  Date Value Ref Range Status  11/12/2015 FINAL  Final    Comment:     ==================================================================== [facility] COMP DRUG ANALYSIS,UR ==================================================================== Test  Result       Flag       Units Drug Present and Declared for Prescription Verification   7-aminoclonazepam              82           EXPECTED   ng/mg creat    7-aminoclonazepam is an expected metabolite of clonazepam. Source    of clonazepam is a scheduled prescription medication.   Trazodone                      PRESENT      EXPECTED   1,3 chlorophenyl piperazine    PRESENT      EXPECTED    1,3-chlorophenyl piperazine is an expected metabolite of    trazodone.   Verapamil                      PRESENT      EXPECTED Drug Present not Declared for Prescription Verification   Hydromorphone                  1656         UNEXPECTED ng/mg creat    Hydromorphone may be administered as a scheduled prescription    medication; it is also an expected metabolite of hydrocodone. Drug Absent but Declared for Prescription Verification   Venlafaxine                    Not Detected UNEXPECTED ==================================================================== Test                      Result    Flag   Units      Ref Range   Creatinine              73               mg/dL      >=20 ==================================================================== Declared Medications:  The flagging and interpretation on this report are based on the  following declared medications.  Unexpected results may arise from  inaccuracies in the declared medications.  **Note: The testing scope of this panel includes these medications:  Clonazepam (Klonopin)  Trazodone (Desyrel)  Venlafaxine (Effexor)  Verapamil (Calan)  **Note: The testing scope of this panel does not include following  reported medications:  Albuterol  Budenoside (Symbicort)  Chlorthalidone  Formoterol (Symbicort)  Multivitamin  Tamsulosin (Flomax)   Tiotropium (Spiriva)  Ubiquinone (Coenzyme Q 10)  Vitamin D2 (Ergocalciferol) ==================================================================== For clinical consultation, please call [phone]. ====================================================================    No results found for: "CBDTHCR", "D8THCCBX", "D9THCCBX"   Pre-op H&P Assessment:  Mr. Kersh is a 71 y.o. (year old), male patient, seen today for interventional treatment. 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 Mr. Axel has a current medication list which includes the following prescription(s): acetaminophen, albuterol, albuterol, amlodipine, apixaban, atorvastatin, clonazepam,  neuriva plus, magnesium oxide, metformin, multivitamin, naloxone, NON FORMULARY, ondansetron, oxygen-helium, pantoprazole, polyvinyl alcohol, potassium chloride sa, propranolol, tamsulosin, tiotropium bromide-olodaterol, torsemide, trazodone, and venlafaxine xr. His primarily concern today is the No chief complaint on file.  Initial Vital Signs:  Pulse/HCG Rate:    Temp:   Resp:   BP:   SpO2:    BMI: Estimated body mass index is 40.27 kg/m as  calculated from the following:   Height as of 11/12/21: _0  (1.651 m).   Weight as of 11/12/21: 242 lb (109.8 kg).  Risk Assessment: Allergies: Reviewed. He is allergic to baclofen and hydralazine.  Allergy Precautions: None required Coagulopathies: Reviewed. None identified.  Blood-thinner therapy: None at this time Active Infection(s): Reviewed. None identified. Mr. Strutz is afebrile  Site Confirmation: Mr. Bracknell was asked to confirm the procedure and laterality before marking the site Procedure checklist: Completed Consent: Before the procedure and under the influence of no sedative(s), amnesic(s), or anxiolytics, the patient was informed of the treatment options, risks and possible complications. To fulfill our ethical and legal obligations, as recommended by the American Medical Association's Code of Ethics, I have informed the patient of my clinical impression; the nature and purpose of the treatment or procedure; the risks, benefits, and possible complications of the intervention; the alternatives, including doing nothing; the risk(s) and benefit(s) of the alternative treatment(s) or procedure(s); and the risk(s) and benefit(s) of doing nothing.  Mr. Cassedy was provided with information about the general risks and possible complications associated with most interventional procedures. These include, but are not limited to: failure to achieve desired goals, infection, bleeding, organ or nerve damage, allergic reactions, paralysis, and/or death.  In addition, he was informed of those risks and possible complications associated to this particular procedure, which include, but are not limited to: damage to the implant; failure to decrease pain; local, systemic, or serious CNS infections, intraspinal abscess with possible cord compression and paralysis, or life-threatening such as meningitis; bleeding; organ damage; nerve injury or damage with subsequent sensory, motor, and/or autonomic system  dysfunction, resulting in transient or permanent pain, numbness, and/or weakness of one or several areas of the body; allergic reactions, either minor or major life-threatening, such as anaphylactic or anaphylactoid reactions.  Furthermore, Mr. Roylance was informed of those risks and complications associated with the medications. These include, but are not limited to: allergic reactions (i.e.: anaphylactic or anaphylactoid reactions); endorphine suppression; bradycardia and/or hypotension; water retention and/or peripheral vascular relaxation leading to lower extremity edema and possible stasis ulcers; respiratory depression and/or shortness of breath; decreased metabolic rate leading to weight gain; swelling or edema; medication-induced neural toxicity; particulate matter embolism and blood vessel occlusion with resultant organ, and/or nervous system infarction; and/or intrathecal granuloma formation with possible spinal cord compression and permanent paralysis.  Before refilling the pump Mr. Mikelson was informed that some of the medications used in the devise may not be FDA approved for such use and therefore it constitutes an off-label use of the medications.  Finally, he was informed that Medicine is not an exact science; therefore, there is also the possibility of unforeseen or unpredictable risks and/or possible complications that may result in a catastrophic outcome. The patient indicated having understood very clearly. We have given the patient no guarantees and we have made no promises. Enough time was given to the patient to ask questions, all of which were answered to the patient's satisfaction. Mr. Bossier has indicated that he wanted to continue with the procedure. Attestation: I, the  ordering provider, attest that I have discussed with the patient the benefits, risks, side-effects, alternatives, likelihood of achieving goals, and potential problems during recovery for the procedure that I have  provided informed consent. Date  Time: {CHL [facility]-PAIN TIME CHOICES:[id number]}  Pre-Procedure Preparation:  Monitoring: As per clinic protocol. Respiration, ETCO2, SpO2, BP, heart rate and rhythm monitor placed and checked for adequate function Safety Precautions: Patient was assessed for positional comfort and pressure points before starting the procedure. Time-out: I initiated and conducted the "Time-out" before starting the procedure, as per protocol. The patient was asked to participate by confirming the accuracy of the "Time Out" information. Verification of the correct person, site, and procedure were performed and confirmed by me, the nursing staff, and the patient. "Time-out" conducted as per Joint Commission's Universal Protocol (UP.01.01.01). Time:    Description of Procedure:          Position: Supine Target Area: Central-port of intrathecal pump. Approach: Anterior, 90 degree angle approach. Area Prepped: Entire Area around the pump implant. DuraPrep (Iodine Povacrylex [0.7% available iodine] and Isopropyl Alcohol, 74% w/w) Safety Precautions: Aspiration looking for blood return was conducted prior to all injections. At no point did we inject any substances, as a needle was being advanced. No attempts were made at seeking any paresthesias. Safe injection practices and needle disposal techniques used. Medications properly checked for expiration dates. SDV (single dose vial) medications used. Description of the Procedure: Protocol guidelines were followed. Two nurses trained to do implant refills were present during the entire procedure. The refill medication was checked by both healthcare providers as well as the patient. The patient was included in the "Time-out" to verify the medication. The patient was placed in position. The pump was identified. The area was prepped in the usual manner. The sterile template was positioned over the pump, making sure the side-port location matched that of  the pump. Both, the pump and the template were held for stability. The needle provided in the Medtronic Kit was then introduced thru the center of the template and into the central port. The pump content was aspirated and discarded volume documented. The new medication was slowly infused into the pump, thru the filter, making sure to avoid overpressure of the device. The needle was then removed and the area cleansed, making sure to leave some of the prepping solution back to take advantage of its long term bactericidal properties. The pump was interrogated and programmed to reflect the correct medication, volume, and dosage. The program was printed and taken to the physician for approval. Once checked and signed by the physician, a copy was provided to the patient and another scanned into the EMR.  There were no vitals filed for this visit.  Start Time:   hrs. End Time:   hrs. Materials & Medications: Medtronic Refill Kit Medication(s): Please see chart orders for details.  Imaging Guidance:          Type of Imaging Technique: None used Indication(s): N/A Exposure Time: No patient exposure Contrast: None used. Fluoroscopic Guidance: N/A Ultrasound Guidance: N/A Interpretation: N/A  Antibiotic Prophylaxis:   Anti-infectives (From admission, onward)    None      Indication(s): None identified  Post-operative Assessment:  Post-procedure Vital Signs:  Pulse/HCG Rate:    Temp:   Resp:   BP:   SpO2:    EBL: None  Complications: No immediate post-treatment complications observed by team, or reported by patient.  Note: The patient tolerated the entire procedure well. A repeat  set of vitals were taken after the procedure and the patient was kept under observation following institutional policy, for this type of procedure. Post-procedural neurological assessment was performed, showing return to baseline, prior to discharge. The patient was provided with post-procedure discharge  instructions, including a section on how to identify potential problems. Should any problems arise concerning this procedure, the patient was given instructions to immediately contact us, at any time, without hesitation. In any case, we plan to contact the patient by telephone for a follow-up status report regarding this interventional procedure.  Comments:  No additional relevant information.  Plan of Care  Orders:  No orders of the defined types were placed in this encounter.  Chronic Opioid Analgesic:  Intrathecal Hydromorphone (Dilaudid) @ 2.399 mg/d. MME: 9.6 mg/day.   Medications ordered for procedure: No orders of the defined types were placed in this encounter.  Medications administered: Chriss Mannan had no medications administered during this visit.  See the medical record for exact dosing, route, and time of administration.  Follow-up plan:   No follow-ups on file.       Interventional Therapies  Risk  Complexity Considerations:   Estimated body mass index is 40.77 kg/m as calculated from the following:   Height as of this encounter: _0  (1.651 m).   Weight as of this encounter: 245 lb (111.1 kg). NOTE: ELIQUIS ANTICOAGULATION (Stop: 3days  Re-start: 6 hrs)   Planned  Pending:   (10/17/2021) we plan to decrease the concentration of his intrathecal bupivacaine from 30 mg/mL to 20 mg/mL on his next refill.   Under consideration:   Continue management of intrathecal pump  Diagnostic bilateral lumbar facet blocks #1    Completed:   None at this time   Therapeutic  Palliative (PRN) options:   Continue management of intrathecal pump       Recent Visits Date Type Provider Dept  11/12/21 Procedure visit Milinda Pointer, MD [facility]  Showing recent visits within past 90 days and meeting all other requirements Future Appointments Date Type Provider Dept  01/21/22 Appointment Milinda Pointer, MD [facility]  Showing future appointments  within next 90 days and meeting all other requirements  Disposition: Discharge home  Discharge (Date  Time): 01/21/2022;   hrs.   Primary Care Physician: [place] Location: [facility] Note by: Gaspar Cola, MD Date: 01/21/2022; Time: 4:32 PM  Disclaimer:  Medicine is not an Chief Strategy Officer. The only guarantee in medicine is that nothing is guaranteed. It is important to note that the decision to proceed with this intervention was based on the information collected from the patient. The Data and conclusions were drawn from the patient's questionnaire, the interview, and the physical examination. Because the information was provided in large part by the patient, it cannot be guaranteed that it has not been purposely or unconsciously manipulated. Every effort has been made to obtain as much relevant data as possible for this evaluation. It is important to note that the conclusions that lead to this procedure are derived in large part from the available data. Always take into account that the treatment will also be dependent on availability of resources and existing treatment guidelines, considered by other Pain Management Practitioners as being common knowledge and practice, at the time of the intervention. For Medico-Legal purposes, it is also important to point out that variation in procedural techniques and pharmacological choices are the acceptable norm. The indications, contraindications, technique, and results of the  above procedure should only be interpreted and judged by a Board-Certified Interventional Pain Specialist with extensive familiarity and expertise in the same exact procedure and technique.

## 2022-01-21 ENCOUNTER — Ambulatory Visit: Payer: Worker's Compensation | Attending: Pain Medicine | Admitting: Pain Medicine

## 2022-01-21 ENCOUNTER — Encounter: Payer: Self-pay | Admitting: Pain Medicine

## 2022-01-21 VITALS — BP 123/65 | HR 76 | Temp 97.4°F | Resp 16 | Ht 65.0 in | Wt 242.0 lb

## 2022-01-21 DIAGNOSIS — Z79899 Other long term (current) drug therapy: Secondary | ICD-10-CM | POA: Diagnosis present

## 2022-01-21 DIAGNOSIS — M5137 Other intervertebral disc degeneration, lumbosacral region: Secondary | ICD-10-CM | POA: Insufficient documentation

## 2022-01-21 DIAGNOSIS — M961 Postlaminectomy syndrome, not elsewhere classified: Secondary | ICD-10-CM | POA: Insufficient documentation

## 2022-01-21 DIAGNOSIS — M545 Low back pain, unspecified: Secondary | ICD-10-CM

## 2022-01-21 DIAGNOSIS — Z451 Encounter for adjustment and management of infusion pump: Secondary | ICD-10-CM | POA: Diagnosis present

## 2022-01-21 DIAGNOSIS — R52 Pain, unspecified: Secondary | ICD-10-CM | POA: Insufficient documentation

## 2022-01-21 DIAGNOSIS — M5441 Lumbago with sciatica, right side: Secondary | ICD-10-CM | POA: Diagnosis present

## 2022-01-21 DIAGNOSIS — G8929 Other chronic pain: Secondary | ICD-10-CM | POA: Diagnosis present

## 2022-01-21 DIAGNOSIS — G894 Chronic pain syndrome: Secondary | ICD-10-CM | POA: Diagnosis present

## 2022-01-21 DIAGNOSIS — Z5189 Encounter for other specified aftercare: Secondary | ICD-10-CM | POA: Diagnosis present

## 2022-01-21 DIAGNOSIS — M79604 Pain in right leg: Secondary | ICD-10-CM | POA: Diagnosis present

## 2022-01-21 DIAGNOSIS — Z978 Presence of other specified devices: Secondary | ICD-10-CM | POA: Insufficient documentation

## 2022-01-21 DIAGNOSIS — M4326 Fusion of spine, lumbar region: Secondary | ICD-10-CM | POA: Insufficient documentation

## 2022-01-21 DIAGNOSIS — M792 Neuralgia and neuritis, unspecified: Secondary | ICD-10-CM | POA: Diagnosis present

## 2022-01-21 MED ORDER — PREGABALIN 25 MG PO CAPS: ORAL_CAPSULE | ORAL | 0 refills | Status: AC

## 2022-01-21 MED ORDER — PREGABALIN 25 MG PO CAPS: ORAL_CAPSULE | ORAL | 0 refills | Status: DC

## 2022-01-21 NOTE — Progress Notes (Signed)
Safety precautions to be maintained throughout the outpatient stay will include: orient to surroundings, keep bed in low position, maintain call bell within reach at all times, provide assistance with transfer out of bed and ambulation.  

## 2022-01-21 NOTE — Patient Instructions (Addendum)
Opioid Overdose Opioids are drugs that are often used to treat pain. Opioids include illegal drugs, such as heroin, as well as prescription pain medicines, such as codeine, morphine, hydrocodone, and fentanyl. An opioid overdose happens when you take too much of an opioid. An overdose may be intentional or accidental and can happen with any type of opioid. The effects of an overdose can be mild, dangerous, or even deadly. Opioid overdose is a medical emergency. What are the causes? This condition may be caused by: Taking too much of an opioid on purpose. Taking too much of an opioid by accident. Using two or more substances that contain opioids at the same time. Taking an opioid with a substance that affects your heart, breathing, or blood pressure. These include alcohol, tranquilizers, sleeping pills, illegal drugs, and some over-the-counter medicines. This condition may also happen due to an error made by: A health care provider who prescribes a medicine. The pharmacist who fills the prescription. What increases the risk? This condition is more likely in: Children. They may be attracted to colorful pills. Because of a child's small size, even a small amount of a medicine can be dangerous. Older people. They may be taking many different medicines. Older people may have difficulty reading labels or remembering when they last took their medicines. They may also be more sensitive to the effects of opioids. People with chronic medical conditions, especially heart, liver, kidney, or neurological diseases. People who take an opioid for a long period of time. People who take opioids and use illegal drugs, such as heroin, or other substances, such as alcohol. People who: Have a history of drug or alcohol abuse. Have certain mental health conditions. Have a history of previous drug overdoses. People who take opioids that are not prescribed for them. What are the signs or symptoms? Symptoms of this  condition depend on the type of opioid and the amount that was taken. Common symptoms include: Sleepiness or difficulty waking from sleep. Confusion. Slurred speech. Slowed breathing and a slow pulse (bradycardia). Nausea and vomiting. Abnormally small pupils. Signs and symptoms that require emergency treatment include: Cold, clammy, and pale skin. Blue lips and fingernails. Vomiting. Gurgling sounds in the throat. A pulse that is very slow or difficult to detect. Breathing that is very irregular, slow, noisy, or difficult to detect. Inability to respond to speech or be awakened from sleep (stupor). Seizures. How is this diagnosed? This condition is diagnosed based on your symptoms and medical history. It is important to tell your health care provider: About all of the opioids that you took. When you took the opioids. Whether you were drinking alcohol or using marijuana, cocaine, or other drugs. Your health care provider will do a physical exam. This exam may include: Checking and monitoring your heart rate and rhythm, breathing rate, temperature, and blood pressure. Measuring oxygen levels in your blood. Checking for abnormally small pupils. You may also have blood tests or urine tests. You may have X-rays if you are having severe breathing problems. How is this treated? This condition requires immediate medical treatment and hospitalization. Reversing the effects of the opioid is the first step in treatment. If you have a Narcan kit or naloxone, use it right away. Follow your health care provider's instructions. A friend or family member can also help you with this. The rest of your treatment will be given in the hospital intensive care (ICU). Treatment in the hospital may include: Giving salts and minerals (electrolytes) along with fluids through   an IV. Inserting a breathing tube (endotracheal tube) in your airway to help you breathe if you cannot breathe on your own or you are in  danger of not being able to breathe on your own. Giving oxygen through a small tube under your nose. Passing a tube through your nose and into your stomach (nasogastric tube, or NG tube) to empty your stomach. Giving medicines that: Increase your blood pressure. Relieve nausea and vomiting. Relieve abdominal pain and cramping. Reverse the effects of the opioid (naloxone). Monitoring your heart and oxygen levels. Ongoing counseling and mental health support if you intentionally overdosed or used an illegal drug. Follow these instructions at home:  Medicines Take over-the-counter and prescription medicines only as told by your health care provider. Always ask your health care provider about possible side effects and interactions of any new medicine that you start taking. Keep a list of all the medicines that you take, including over-the-counter medicines. Bring this list with you to all your medical visits. General instructions Drink enough fluid to keep your urine pale yellow. Keep all follow-up visits. This is important. How is this prevented? Read the drug inserts that come with your opioid pain medicines. Take medicines only as told by your health care provider. Do not take more medicine than you are told. Do not take medicines more frequently than you are told. Do not drink alcohol or take sedatives when taking opioids. Do not use illegal or recreational drugs, including cocaine, ecstasy, and marijuana. Do not take opioid medicines that are not prescribed for you. Store all medicines in safety containers that are out of the reach of children. Get help if you are struggling with: Alcohol or drug use. Depression or another mental health problem. Thoughts of hurting yourself or another person. Keep the phone number of your local poison control center near your phone or in your mobile phone. In the [place] the hotline of the National Poison Control Center is (800) 222-1222. If you were  prescribed naloxone, make sure you understand how to take it. Contact a health care provider if: You need help understanding how to take your pain medicines. You feel your medicines are too strong. You are concerned that your pain medicines are not working well for your pain. You develop new symptoms or side effects when you are taking medicines. Get help right away if: You or someone else is having symptoms of an opioid overdose. Get help even if you are not sure. You have thoughts about hurting yourself or others. You have: Chest pain. Difficulty breathing. A loss of consciousness. These symptoms may represent a serious problem that is an emergency. Do not wait to see if the symptoms will go away. Get medical help right away. Call your local emergency services (911 in the [place] Do not drive yourself to the hospital. If you ever feel like you may hurt yourself or others, or have thoughts about taking your own life, get help right away. You can go to your nearest emergency department or: Call your local emergency services (911 in the [place] Call a suicide crisis helpline, such as the National Suicide Prevention Lifeline at 1-800-273-8255 or 988 in the [place] This is open 24 hours a day in the [place] Text the Crisis Text Line at 741741 (in the [place] Summary Opioids are drugs that are often used to treat pain. Opioids include illegal drugs, such as heroin, as well as prescription pain medicines. An opioid overdose happens when you take too much of an   opioid. Overdoses can be intentional or accidental. Opioid overdose is very dangerous. It is a life-threatening emergency. If you or someone you know is experiencing an opioid overdose, get help right away. This information is not intended to replace advice given to you by your health care provider. Make sure you discuss any questions you have with your health care provider. Document Revised: 08/29/2020 Document Reviewed: 05/16/2020 [employer]  Patient Education  2023 [employer]  ____________________________________________________________________________________________  Patient Information update  To: All of our patients.  Re: Name change.  It has been made official that our current name, "[facility]"   will soon be changed to "[facility]".   The purpose of this change is to eliminate any confusion created by the concept of our practice being a "Medication Management Pain Clinic". In the past this has led to the misconception that we treat pain primarily by the use of prescription medications.  Nothing can be farther from the truth.   Understanding PAIN MANAGEMENT: To further understand what our practice does, you first have to understand that "Pain Management" is a subspecialty that requires additional training once a physician has completed their specialty training, which can be in either Anesthesia, Neurology, Psychiatry, or Physical Medicine and Rehabilitation (PMR). Each one of these contributes to the final approach taken by each physician to the management of their patient's pain. To be a "Pain Management Specialist" you must have first completed one of the specialty trainings below.  Anesthesiologists - trained in clinical pharmacology and interventional techniques such as nerve blockade and regional as well as central neuroanatomy. They are trained to block pain before, during, and after surgical interventions.  Neurologists - trained in the diagnosis and pharmacological treatment of complex neurological conditions, such as Multiple Sclerosis, Parkinson's, spinal cord injuries, and other systemic conditions that may be associated with symptoms that may include but are not limited to pain. They tend to rely primarily on the treatment of chronic pain using prescription medications.  Psychiatrist - trained in  conditions affecting the psychosocial wellbeing of patients including but not limited to depression, anxiety, schizophrenia, personality disorders, addiction, and other substance use disorders that may be associated with chronic pain. They tend to rely primarily on the treatment of chronic pain using prescription medications.   Physical Medicine and Rehabilitation (PMR) physicians, also known as physiatrists - trained to treat a wide variety of medical conditions affecting the brain, spinal cord, nerves, bones, joints, ligaments, muscles, and tendons. Their training is primarily aimed at treating patients that have suffered injuries that have caused severe physical impairment. Their training is primarily aimed at the physical therapy and rehabilitation of those patients. They may also work alongside orthopedic surgeons or neurosurgeons using their expertise in assisting surgical patients to recover after their surgeries.  INTERVENTIONAL PAIN MANAGEMENT is sub-subspecialty of Pain Management.  Our physicians are Board-certified in Anesthesia, Pain Management, and Interventional Pain Management.  This meaning that not only have they been trained and Board-certified in their specialty of Anesthesia, and subspecialty of Pain Management, but they have also received further training in the sub-subspecialty of Interventional Pain Management, in order to become Board-certified as INTERVENTIONAL PAIN MANAGEMENT SPECIALIST.    Mission: Our goal is to use our skills in  INTERVENTIONAL PAIN MANAGEMENT as alternatives to the chronic use of prescription opioid medications for the treatment of pain. To make this more clear, we have changed our name to reflect what   we do and offer. We will continue to offer medication management assessment and recommendations, but we will not be taking over any patient's medication  management.  ____________________________________________________________________________________________

## 2022-01-21 NOTE — Addendum Note (Signed)
Addended by: Milinda Pointer A on: 01/21/2022 02:49 PM   Modules accepted: Orders

## 2022-01-22 ENCOUNTER — Telehealth: Payer: Self-pay | Admitting: *Deleted

## 2022-01-22 ENCOUNTER — Encounter: Payer: Self-pay | Admitting: Pain Medicine

## 2022-01-22 NOTE — Telephone Encounter (Signed)
Called for post pump refill check. Denies any issues.

## 2022-01-29 MED FILL — Medication: INTRATHECAL | Qty: 1 | Status: AC

## 2022-02-04 ENCOUNTER — Encounter: Payer: Self-pay | Admitting: Pain Medicine

## 2022-03-10 ENCOUNTER — Other Ambulatory Visit: Payer: Self-pay

## 2022-03-10 MED ORDER — PAIN MANAGEMENT IT PUMP REFILL: 1.0000 | Freq: Once | INTRATHECAL | 0 refills | Status: AC

## 2022-04-07 NOTE — Progress Notes (Unsigned)
PROVIDER NOTE: Interpretation of information contained herein should be left to medically-trained personnel. Specific patient instructions are provided elsewhere under "Patient Instructions" section of medical record. This document was created in part using STT-dictation technology, any transcriptional errors that may result from this process are unintentional.  Patient: Brian Spencer Type: Established DOB: 1950/05/12 MRN: [id number] PCP: [facility]  Service: Procedure DOS: 04/08/2022 Setting: Ambulatory Location: Ambulatory outpatient facility Delivery: Face-to-face Provider: Gaspar Cola, MD Specialty: Interventional Pain Management Specialty designation: 09 Location: Outpatient facility Ref. Prov.: [place]       Interventional Therapy   Primary Reason for Visit: Interventional Pain Management Treatment. CC: No chief complaint on file.  Procedure:          Type: Management of Intrathecal Drug Delivery System (IDDS) - Reservoir Refill [id number]). No rate change.  Indications: 1. Chronic pain syndrome   2. Chronic low back pain (1ry area of Pain) (Bilateral) (R>L)   3. Chronic lower extremity pain (2ry area of Pain) (Right)   4. Failed back surgical syndrome   5. DDD (degenerative disc disease), lumbosacral   6. Fusion of lumbar spine   7. Presence of intrathecal pump   8. Encounter for adjustment or management of infusion pump   9. Encounter for therapeutic procedure   10. Pharmacologic therapy   11. Encounter for medication management    Pain Assessment: Self-Reported Pain Score:  /10             Reported level is compatible with observation.         Intrathecal Drug Delivery System (IDDS)  Pump Device:  Manufacturer: Medtronic Model: Synchromed II Model No.: [id number] Serial No.: [id number] H Delivery Route: Intrathecal Type: Programmable  Volume (mL): 40 mL reservoir Priming Volume: 0.207 mL  Calibration Constant: 117.0  MRI compatibility: Conditional    Implant Details:  Date: 10/08/20 (Third Pump) Implanter: Dr. Deetta Perla (Last)  Contact Information: [facility], [place], [place] Last Revision/Replacement: 10/08/2020 Estimated Replacement Date: May 2029  Implant Site: Abdominal Laterality: Right  Catheter: Manufacturer: Medtronic Model:  n/a  Model No.: [id number]  Serial No.: n/a  Implanted Length (cm): 94.1  Catheter Volume (mL): 0.207  Tip Location (Level): T10 Canal Access Site: L2-3  Drug content:  Primary Medication Class: Opioid analgesic Medication: PF-Fentanyl Concentration: 2000 mcg/mL   Secondary Medication Class: Local anesthetic Medication: PF-bupivacaine Concentration: 20 mg/mL  Tertiary Medication Class: Nonopioid analgesic Medication: PF-clonidine Concentration: 10 mcg/mL  Fourth Medication Class: none  Medication: n/a  Concentration: n/a  PTC/PCA parameters:  Mode: Off/Inactive  Programming:  Type: Simple continuous.  Medication, Concentration, Infusion Program, & Delivery Rate: For up-to-date details please see most recent scanned programming printout.        Changes:  Medication Change: None at this point Rate Change: No change in rate  Reported side-effects or adverse reactions: None reported  Effectiveness: Described as relatively effective, allowing for increase in activities of daily living (ADL) Clinically meaningful improvement in function (CMIF): Sustained CMIF goals met  Plan: Pump refill today   Pharmacotherapy Assessment   Opioid Analgesic: Intrathecal Hydromorphone (Dilaudid) @ 2.399 mg/d. MME: 9.6 mg/day.   Monitoring:  PMP: PDMP reviewed during this encounter.       Pharmacotherapy: No side-effects or adverse reactions reported. Compliance: No problems identified. Effectiveness: Clinically acceptable. Plan: Refer to "POC". UDS:  Summary  Date Value Ref Range Status  11/12/2015 FINAL  Final    Comment:     ==================================================================== [facility] COMP DRUG ANALYSIS,UR ==================================================================== Test  Result       Flag       Units Drug Present and Declared for Prescription Verification   7-aminoclonazepam              82           EXPECTED   ng/mg creat    7-aminoclonazepam is an expected metabolite of clonazepam. Source    of clonazepam is a scheduled prescription medication.   Trazodone                      PRESENT      EXPECTED   1,3 chlorophenyl piperazine    PRESENT      EXPECTED    1,3-chlorophenyl piperazine is an expected metabolite of    trazodone.   Verapamil                      PRESENT      EXPECTED Drug Present not Declared for Prescription Verification   Hydromorphone                  1656         UNEXPECTED ng/mg creat    Hydromorphone may be administered as a scheduled prescription    medication; it is also an expected metabolite of hydrocodone. Drug Absent but Declared for Prescription Verification   Venlafaxine                    Not Detected UNEXPECTED ==================================================================== Test                      Result    Flag   Units      Ref Range   Creatinine              73               mg/dL      >=20 ==================================================================== Declared Medications:  The flagging and interpretation on this report are based on the  following declared medications.  Unexpected results may arise from  inaccuracies in the declared medications.  **Note: The testing scope of this panel includes these medications:  Clonazepam (Klonopin)  Trazodone (Desyrel)  Venlafaxine (Effexor)  Verapamil (Calan)  **Note: The testing scope of this panel does not include following  reported medications:  Albuterol  Budenoside (Symbicort)  Chlorthalidone  Formoterol (Symbicort)  Multivitamin  Tamsulosin (Flomax)   Tiotropium (Spiriva)  Ubiquinone (Coenzyme Q 10)  Vitamin D2 (Ergocalciferol) ==================================================================== For clinical consultation, please call [phone]. ====================================================================    No results found for: "CBDTHCR", "D8THCCBX", "D9THCCBX"   Pre-op H&P Assessment:  Mr. Kersh is a 72 y.o. (year old), male patient, seen today for interventional treatment. 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 Mr. Axel has a current medication list which includes the following prescription(s): acetaminophen, albuterol, albuterol, amlodipine, apixaban, atorvastatin, clonazepam,  neuriva plus, empagliflozin, magnesium oxide, metformin, multivitamin, naloxone, NON FORMULARY, ondansetron, oxygen-helium, pantoprazole, polyvinyl alcohol, potassium chloride sa, pregabalin, propranolol, tamsulosin, tiotropium bromide-olodaterol, torsemide, trazodone, and venlafaxine xr. His primarily concern today is the No chief complaint on file.  Initial Vital Signs:  Pulse/HCG Rate:    Temp:   Resp:   BP:   SpO2:    BMI: Estimated body mass  index is 40.27 kg/m as calculated from the following:   Height as of 01/21/22: 5' 5"$  (1.651 m).   Weight as of 01/21/22: 242 lb (109.8 kg).  Risk Assessment: Allergies: Reviewed. He is allergic to baclofen, hydralazine, and gabapentin.  Allergy Precautions: None required Coagulopathies: Reviewed. None identified.  Blood-thinner therapy: None at this time Active Infection(s): Reviewed. None identified. Mr. Mcghie is afebrile  Site Confirmation: Mr. Takashima was asked to confirm the procedure and laterality before marking the site Procedure checklist: Completed Consent: Before the procedure and under the influence of no sedative(s), amnesic(s), or anxiolytics, the patient was informed of the treatment options, risks and possible complications. To fulfill our ethical and legal obligations, as recommended by the American Medical Association's Code of Ethics, I have informed the patient of my clinical impression; the nature and purpose of the treatment or procedure; the risks, benefits, and possible complications of the intervention; the alternatives, including doing nothing; the risk(s) and benefit(s) of the alternative treatment(s) or procedure(s); and the risk(s) and benefit(s) of doing nothing.  Mr. Admire was provided with information about the general risks and possible complications associated with most interventional procedures. These include, but are not limited to: failure to achieve desired goals, infection, bleeding, organ or nerve damage, allergic reactions, paralysis, and/or death.  In addition, he was informed of those risks and possible complications associated to this particular procedure, which include, but are not limited to: damage to the implant; failure to decrease pain; local, systemic, or serious CNS infections, intraspinal abscess with possible cord compression and paralysis, or life-threatening such as meningitis; bleeding; organ damage; nerve injury or damage with subsequent sensory,  motor, and/or autonomic system dysfunction, resulting in transient or permanent pain, numbness, and/or weakness of one or several areas of the body; allergic reactions, either minor or major life-threatening, such as anaphylactic or anaphylactoid reactions.  Furthermore, Mr. Manford was informed of those risks and complications associated with the medications. These include, but are not limited to: allergic reactions (i.e.: anaphylactic or anaphylactoid reactions); endorphine suppression; bradycardia and/or hypotension; water retention and/or peripheral vascular relaxation leading to lower extremity edema and possible stasis ulcers; respiratory depression and/or shortness of breath; decreased metabolic rate leading to weight gain; swelling or edema; medication-induced neural toxicity; particulate matter embolism and blood vessel occlusion with resultant organ, and/or nervous system infarction; and/or intrathecal granuloma formation with possible spinal cord compression and permanent paralysis.  Before refilling the pump Mr. Klemz was informed that some of the medications used in the devise may not be FDA approved for such use and therefore it constitutes an off-label use of the medications.  Finally, he was informed that Medicine is not an exact science; therefore, there is also the possibility of unforeseen or unpredictable risks and/or possible complications that may result in a catastrophic outcome. The patient indicated having understood very clearly. We have given the patient no guarantees and we have made no promises. Enough time was given to the patient to ask questions, all of which were answered to the patient's satisfaction. Mr. Mohar has indicated that he wanted to continue with the procedure.  Attestation: I, the ordering provider, attest that I have discussed with the patient the benefits, risks, side-effects, alternatives, likelihood of achieving goals, and potential problems during recovery for  the procedure that I have provided informed consent. Date  Time: {CHL [facility]-PAIN TIME CHOICES:21018001}  Pre-Procedure Preparation:  Monitoring: As per clinic protocol. Respiration, ETCO2, SpO2, BP, heart rate and rhythm monitor placed and checked for adequate function Safety Precautions: Patient was assessed for positional comfort and pressure points before starting the procedure. Time-out: I initiated and conducted the "Time-out" before starting the procedure, as per protocol. The patient was asked to participate by confirming the accuracy of the "Time Out" information. Verification of the correct person, site, and procedure were performed and confirmed by me, the nursing staff, and the patient. "Time-out" conducted as per Joint Commission's Universal Protocol (UP.01.01.01). Time:    Description of Procedure:          Position: Supine Target Area: Central-port of intrathecal pump. Approach: Anterior, 90 degree angle approach. Area Prepped: Entire Area around the pump implant. DuraPrep (Iodine Povacrylex [0.7% available iodine] and Isopropyl Alcohol, 74% w/w) Safety Precautions: Aspiration looking for blood return was conducted prior to all injections. At no point did we inject any substances, as a needle was being advanced. No attempts were made at seeking any paresthesias. Safe injection practices and needle disposal techniques used. Medications properly checked for expiration dates. SDV (single dose vial) medications used. Description of the Procedure: Protocol guidelines were followed. Two nurses trained to do implant refills were present during the entire procedure. The refill medication was checked by both healthcare providers as well as the patient. The patient was included in the "Time-out" to verify the medication. The patient was placed in position. The pump was identified. The area was prepped in the usual manner. The sterile template was positioned over the pump, making sure the  side-port location matched that of the pump. Both, the pump and the template were held for stability. The needle provided in the Medtronic Kit was then introduced thru the center of the template and into the central port. The pump content was aspirated and discarded volume documented. The new medication was slowly infused into the pump, thru the filter, making sure to avoid overpressure of the device. The needle was then removed and the area cleansed, making sure to leave some of the prepping solution back to take advantage of its long term bactericidal properties. The pump was interrogated and programmed to reflect the correct medication, volume, and dosage. The program was printed and taken to the physician for approval. Once checked and signed by the physician, a copy was provided to the patient and another scanned into the EMR.  There were no vitals filed for this visit.  Start Time:   hrs. End Time:   hrs. Materials & Medications: Medtronic Refill Kit Medication(s): Please see chart orders for details.  Imaging Guidance:          Type of Imaging Technique: None used Indication(s): N/A Exposure Time: No patient exposure Contrast: None used. Fluoroscopic Guidance: N/A Ultrasound Guidance: N/A Interpretation: N/A  Antibiotic Prophylaxis:   Anti-infectives (From admission, onward)    None      Indication(s): None identified  Post-operative Assessment:  Post-procedure Vital Signs:  Pulse/HCG Rate:    Temp:   Resp:   BP:   SpO2:    EBL: None  Complications: No immediate post-treatment complications observed by team, or reported by patient.  Note: The patient tolerated the entire procedure  well. A repeat set of vitals were taken after the procedure and the patient was kept under observation following institutional policy, for this type of procedure. Post-procedural neurological assessment was performed, showing return to baseline, prior to discharge. The patient was provided with  post-procedure discharge instructions, including a section on how to identify potential problems. Should any problems arise concerning this procedure, the patient was given instructions to immediately contact us, at any time, without hesitation. In any case, we plan to contact the patient by telephone for a follow-up status report regarding this interventional procedure.  Comments:  No additional relevant information.  Plan of Care (POC)  Orders:  No orders of the defined types were placed in this encounter.  Chronic Opioid Analgesic:  Intrathecal Hydromorphone (Dilaudid) @ 2.399 mg/d. MME: 9.6 mg/day.   Medications ordered for procedure: No orders of the defined types were placed in this encounter.  Medications administered: Antonine Ratterree had no medications administered during this visit.  See the medical record for exact dosing, route, and time of administration.  Follow-up plan:   No follow-ups on file.       Interventional Therapies  Risk  Complexity Considerations:   Estimated body mass index is 40.77 kg/m as calculated from the following:   Height as of this encounter: 5' 5"$  (1.651 m).   Weight as of this encounter: 245 lb (111.1 kg). NOTE: ELIQUIS ANTICOAGULATION (Stop: 3days  Re-start: 6 hrs)   Planned  Pending:   (10/17/2021) we plan to decrease the concentration of his intrathecal bupivacaine from 30 mg/mL to 20 mg/mL on his next refill.   Under consideration:   Continue management of intrathecal pump  Diagnostic bilateral lumbar facet blocks #1    Completed:   None at this time   Therapeutic  Palliative (PRN) options:   Continue management of intrathecal pump         Recent Visits Date Type Provider Dept  01/21/22 Procedure visit Milinda Pointer, MD [facility]  Showing recent visits within past 90 days and meeting all other requirements Future Appointments Date Type Provider Dept  04/08/22 Appointment Milinda Pointer, MD [facility]  Showing future appointments within next 90 days and meeting all other requirements  Disposition: Discharge home  Discharge (Date  Time): 04/08/2022;   hrs.   Primary Care Physician: [place] Location: [facility] Note by: Gaspar Cola, MD (TTS technology used. I apologize for any typographical errors that were not detected and corrected.) Date: 04/08/2022; Time: 9:57 AM  Disclaimer:  Medicine is not an Chief Strategy Officer. The only guarantee in medicine is that nothing is guaranteed. It is important to note that the decision to proceed with this intervention was based on the information collected from the patient. The Data and conclusions were drawn from the patient's questionnaire, the interview, and the physical examination. Because the information was provided in large part by the patient, it cannot be guaranteed that it has not been purposely or unconsciously manipulated. Every effort has been made to obtain as much relevant data as possible for this evaluation. It is important to note that the conclusions that lead to this procedure are derived in large part from the available data. Always take into account that the treatment will also be dependent on availability of resources and existing treatment guidelines, considered by other Pain Management Practitioners as being common knowledge and practice, at the time of the intervention. For Medico-Legal purposes, it is also important to point  out that variation in procedural techniques and pharmacological choices are the acceptable norm. The indications, contraindications, technique, and results of the above procedure should only be interpreted and judged by a Board-Certified Interventional Pain Specialist with extensive familiarity and expertise in the same exact procedure and technique.

## 2022-04-08 ENCOUNTER — Encounter: Payer: Self-pay | Admitting: Pain Medicine

## 2022-04-08 ENCOUNTER — Ambulatory Visit: Payer: Worker's Compensation | Attending: Pain Medicine | Admitting: Pain Medicine

## 2022-04-08 VITALS — BP 130/73 | HR 69 | Temp 97.5°F | Ht 65.0 in | Wt 230.0 lb

## 2022-04-08 DIAGNOSIS — M4326 Fusion of spine, lumbar region: Secondary | ICD-10-CM | POA: Insufficient documentation

## 2022-04-08 DIAGNOSIS — G894 Chronic pain syndrome: Secondary | ICD-10-CM | POA: Diagnosis present

## 2022-04-08 DIAGNOSIS — Z451 Encounter for adjustment and management of infusion pump: Secondary | ICD-10-CM | POA: Insufficient documentation

## 2022-04-08 DIAGNOSIS — M5441 Lumbago with sciatica, right side: Secondary | ICD-10-CM | POA: Diagnosis present

## 2022-04-08 DIAGNOSIS — M79604 Pain in right leg: Secondary | ICD-10-CM | POA: Diagnosis present

## 2022-04-08 DIAGNOSIS — Z79899 Other long term (current) drug therapy: Secondary | ICD-10-CM | POA: Diagnosis present

## 2022-04-08 DIAGNOSIS — Z5189 Encounter for other specified aftercare: Secondary | ICD-10-CM | POA: Insufficient documentation

## 2022-04-08 DIAGNOSIS — M961 Postlaminectomy syndrome, not elsewhere classified: Secondary | ICD-10-CM | POA: Diagnosis present

## 2022-04-08 DIAGNOSIS — G8929 Other chronic pain: Secondary | ICD-10-CM | POA: Diagnosis present

## 2022-04-08 DIAGNOSIS — M5137 Other intervertebral disc degeneration, lumbosacral region: Secondary | ICD-10-CM | POA: Insufficient documentation

## 2022-04-08 DIAGNOSIS — Z978 Presence of other specified devices: Secondary | ICD-10-CM | POA: Diagnosis present

## 2022-04-08 MED ORDER — PAIN MANAGEMENT INTRATHECAL (IT) PUMP: 1.0000 | Freq: Once | 0 refills | Status: AC

## 2022-04-08 MED ORDER — OXYCODONE-ACETAMINOPHEN 5-325 MG PO TABS: 1.0000 | ORAL_TABLET | Freq: Four times a day (QID) | ORAL | 0 refills | Status: AC | PRN

## 2022-04-08 NOTE — Patient Instructions (Addendum)
Opioid Overdose Opioids are drugs that are often used to treat pain. Opioids include illegal drugs, such as heroin, as well as prescription pain medicines, such as codeine, morphine, hydrocodone, and fentanyl. An opioid overdose happens when you take too much of an opioid. An overdose may be intentional or accidental and can happen with any type of opioid. The effects of an overdose can be mild, dangerous, or even deadly. Opioid overdose is a medical emergency. What are the causes? This condition may be caused by: Taking too much of an opioid on purpose. Taking too much of an opioid by accident. Using two or more substances that contain opioids at the same time. Taking an opioid with a substance that affects your heart, breathing, or blood pressure. These include alcohol, tranquilizers, sleeping pills, illegal drugs, and some over-the-counter medicines. This condition may also happen due to an error made by: A health care provider who prescribes a medicine. The pharmacist who fills the prescription. What increases the risk? This condition is more likely in: Children. They may be attracted to colorful pills. Because of a child's small size, even a small amount of a medicine can be dangerous. Older people. They may be taking many different medicines. Older people may have difficulty reading labels or remembering when they last took their medicines. They may also be more sensitive to the effects of opioids. People with chronic medical conditions, especially heart, liver, kidney, or neurological diseases. People who take an opioid for a long period of time. People who take opioids and use illegal drugs, such as heroin, or other substances, such as alcohol. People who: Have a history of drug or alcohol abuse. Have certain mental health conditions. Have a history of previous drug overdoses. People who take opioids that are not prescribed for them. What are the signs or symptoms? Symptoms of this  condition depend on the type of opioid and the amount that was taken. Common symptoms include: Sleepiness or difficulty waking from sleep. Confusion. Slurred speech. Slowed breathing and a slow pulse (bradycardia). Nausea and vomiting. Abnormally small pupils. Signs and symptoms that require emergency treatment include: Cold, clammy, and pale skin. Blue lips and fingernails. Vomiting. Gurgling sounds in the throat. A pulse that is very slow or difficult to detect. Breathing that is very irregular, slow, noisy, or difficult to detect. Inability to respond to speech or be awakened from sleep (stupor). Seizures. How is this diagnosed? This condition is diagnosed based on your symptoms and medical history. It is important to tell your health care provider: About all of the opioids that you took. When you took the opioids. Whether you were drinking alcohol or using marijuana, cocaine, or other drugs. Your health care provider will do a physical exam. This exam may include: Checking and monitoring your heart rate and rhythm, breathing rate, temperature, and blood pressure. Measuring oxygen levels in your blood. Checking for abnormally small pupils. You may also have blood tests or urine tests. You may have X-rays if you are having severe breathing problems. How is this treated? This condition requires immediate medical treatment and hospitalization. Reversing the effects of the opioid is the first step in treatment. If you have a Narcan kit or naloxone, use it right away. Follow your health care provider's instructions. A friend or family member can also help you with this. The rest of your treatment will be given in the hospital intensive care (ICU). Treatment in the hospital may include: Giving salts and minerals (electrolytes) along with fluids through   an IV. Inserting a breathing tube (endotracheal tube) in your airway to help you breathe if you cannot breathe on your own or you are in  danger of not being able to breathe on your own. Giving oxygen through a small tube under your nose. Passing a tube through your nose and into your stomach (nasogastric tube, or NG tube) to empty your stomach. Giving medicines that: Increase your blood pressure. Relieve nausea and vomiting. Relieve abdominal pain and cramping. Reverse the effects of the opioid (naloxone). Monitoring your heart and oxygen levels. Ongoing counseling and mental health support if you intentionally overdosed or used an illegal drug. Follow these instructions at home:  Medicines Take over-the-counter and prescription medicines only as told by your health care provider. Always ask your health care provider about possible side effects and interactions of any new medicine that you start taking. Keep a list of all the medicines that you take, including over-the-counter medicines. Bring this list with you to all your medical visits. General instructions Drink enough fluid to keep your urine pale yellow. Keep all follow-up visits. This is important. How is this prevented? Read the drug inserts that come with your opioid pain medicines. Take medicines only as told by your health care provider. Do not take more medicine than you are told. Do not take medicines more frequently than you are told. Do not drink alcohol or take sedatives when taking opioids. Do not use illegal or recreational drugs, including cocaine, ecstasy, and marijuana. Do not take opioid medicines that are not prescribed for you. Store all medicines in safety containers that are out of the reach of children. Get help if you are struggling with: Alcohol or drug use. Depression or another mental health problem. Thoughts of hurting yourself or another person. Keep the phone number of your local poison control center near your phone or in your mobile phone. In the [place] the hotline of the National Poison Control Center is (800) 222-1222. If you were  prescribed naloxone, make sure you understand how to take it. Contact a health care provider if: You need help understanding how to take your pain medicines. You feel your medicines are too strong. You are concerned that your pain medicines are not working well for your pain. You develop new symptoms or side effects when you are taking medicines. Get help right away if: You or someone else is having symptoms of an opioid overdose. Get help even if you are not sure. You have thoughts about hurting yourself or others. You have: Chest pain. Difficulty breathing. A loss of consciousness. These symptoms may represent a serious problem that is an emergency. Do not wait to see if the symptoms will go away. Get medical help right away. Call your local emergency services (911 in the [place] Do not drive yourself to the hospital. If you ever feel like you may hurt yourself or others, or have thoughts about taking your own life, get help right away. You can go to your nearest emergency department or: Call your local emergency services (911 in the [place] Call a suicide crisis helpline, such as the National Suicide Prevention Lifeline at 1-800-273-8255 or 988 in the [place] This is open 24 hours a day in the [place] Text the Crisis Text Line at 741741 (in the [place] Summary Opioids are drugs that are often used to treat pain. Opioids include illegal drugs, such as heroin, as well as prescription pain medicines. An opioid overdose happens when you take too much of an   opioid. Overdoses can be intentional or accidental. Opioid overdose is very dangerous. It is a life-threatening emergency. If you or someone you know is experiencing an opioid overdose, get help right away. This information is not intended to replace advice given to you by your health care provider. Make sure you discuss any questions you have with your health care provider. Document Revised: 08/29/2020 Document Reviewed: 05/16/2020 [employer]  Patient Education  2023 [employer]  ____________________________________________________________________________________________  Patient Information update  To: All of our patients.  Re: Name change.  It has been made official that our current name, "[facility]"   will soon be changed to "[facility]".   The purpose of this change is to eliminate any confusion created by the concept of our practice being a "Medication Management Pain Clinic". In the past this has led to the misconception that we treat pain primarily by the use of prescription medications.  Nothing can be farther from the truth.   Understanding PAIN MANAGEMENT: To further understand what our practice does, you first have to understand that "Pain Management" is a subspecialty that requires additional training once a physician has completed their specialty training, which can be in either Anesthesia, Neurology, Psychiatry, or Physical Medicine and Rehabilitation (PMR). Each one of these contributes to the final approach taken by each physician to the management of their patient's pain. To be a "Pain Management Specialist" you must have first completed one of the specialty trainings below.  Anesthesiologists - trained in clinical pharmacology and interventional techniques such as nerve blockade and regional as well as central neuroanatomy. They are trained to block pain before, during, and after surgical interventions.  Neurologists - trained in the diagnosis and pharmacological treatment of complex neurological conditions, such as Multiple Sclerosis, Parkinson's, spinal cord injuries, and other systemic conditions that may be associated with symptoms that may include but are not limited to pain. They tend to rely primarily on the treatment of chronic pain using prescription medications.  Psychiatrist - trained in  conditions affecting the psychosocial wellbeing of patients including but not limited to depression, anxiety, schizophrenia, personality disorders, addiction, and other substance use disorders that may be associated with chronic pain. They tend to rely primarily on the treatment of chronic pain using prescription medications.   Physical Medicine and Rehabilitation (PMR) physicians, also known as physiatrists - trained to treat a wide variety of medical conditions affecting the brain, spinal cord, nerves, bones, joints, ligaments, muscles, and tendons. Their training is primarily aimed at treating patients that have suffered injuries that have caused severe physical impairment. Their training is primarily aimed at the physical therapy and rehabilitation of those patients. They may also work alongside orthopedic surgeons or neurosurgeons using their expertise in assisting surgical patients to recover after their surgeries.  INTERVENTIONAL PAIN MANAGEMENT is sub-subspecialty of Pain Management.  Our physicians are Board-certified in Anesthesia, Pain Management, and Interventional Pain Management.  This meaning that not only have they been trained and Board-certified in their specialty of Anesthesia, and subspecialty of Pain Management, but they have also received further training in the sub-subspecialty of Interventional Pain Management, in order to become Board-certified as INTERVENTIONAL PAIN MANAGEMENT SPECIALIST.    Mission: Our goal is to use our skills in  INTERVENTIONAL PAIN MANAGEMENT as alternatives to the chronic use of prescription opioid medications for the treatment of pain. To make this more clear, we have changed our name to reflect what   we do and offer. We will continue to offer medication management assessment and recommendations, but we will not be taking over any patient's medication  management.  ____________________________________________________________________________________________     

## 2022-04-09 ENCOUNTER — Ambulatory Visit: Payer: Worker's Compensation | Attending: Pain Medicine | Admitting: Pain Medicine

## 2022-04-09 ENCOUNTER — Encounter: Payer: Self-pay | Admitting: Pain Medicine

## 2022-04-09 ENCOUNTER — Telehealth: Payer: Self-pay

## 2022-04-09 VITALS — BP 145/77 | HR 87 | Temp 97.3°F | Ht 65.0 in | Wt 230.0 lb

## 2022-04-09 DIAGNOSIS — M4326 Fusion of spine, lumbar region: Secondary | ICD-10-CM | POA: Insufficient documentation

## 2022-04-09 DIAGNOSIS — Z451 Encounter for adjustment and management of infusion pump: Secondary | ICD-10-CM | POA: Insufficient documentation

## 2022-04-09 DIAGNOSIS — G8929 Other chronic pain: Secondary | ICD-10-CM | POA: Insufficient documentation

## 2022-04-09 DIAGNOSIS — M79604 Pain in right leg: Secondary | ICD-10-CM | POA: Diagnosis present

## 2022-04-09 DIAGNOSIS — G894 Chronic pain syndrome: Secondary | ICD-10-CM | POA: Insufficient documentation

## 2022-04-09 DIAGNOSIS — M961 Postlaminectomy syndrome, not elsewhere classified: Secondary | ICD-10-CM | POA: Diagnosis present

## 2022-04-09 DIAGNOSIS — M5137 Other intervertebral disc degeneration, lumbosacral region: Secondary | ICD-10-CM | POA: Diagnosis present

## 2022-04-09 DIAGNOSIS — Z79899 Other long term (current) drug therapy: Secondary | ICD-10-CM | POA: Diagnosis present

## 2022-04-09 DIAGNOSIS — Z978 Presence of other specified devices: Secondary | ICD-10-CM | POA: Insufficient documentation

## 2022-04-09 DIAGNOSIS — Z5189 Encounter for other specified aftercare: Secondary | ICD-10-CM | POA: Insufficient documentation

## 2022-04-09 DIAGNOSIS — M5441 Lumbago with sciatica, right side: Secondary | ICD-10-CM | POA: Diagnosis present

## 2022-04-09 MED ORDER — NALOXONE HCL 4 MG/0.1ML NA LIQD: 1.0000 | NASAL | 0 refills | Status: AC | PRN

## 2022-04-09 MED FILL — Medication: INTRATHECAL | Qty: 1 | Status: AC

## 2022-04-09 NOTE — Telephone Encounter (Signed)
Post procedure folow up.  Patient is coming in today for another refill due to cracked medication yesterday.

## 2022-04-09 NOTE — Patient Instructions (Signed)
Patient states he has Narcan at home  Post-procedure Information What to expect: Most procedures involve the use of a local anesthetic (numbing medicine), and a steroid (anti-inflammatory medicine).  The local anesthetics may cause temporary numbness and weakness of the legs or arms, depending on the location of the block. This numbness/weakness may last 4-6 hours, depending on the local anesthetic used. In rare instances, it can last up to 24 hours. While numb, you must be very careful not to injure the extremity.  After any procedure, you could expect the pain to get better within 15-20 minutes. This relief is temporary and may last 4-6 hours. Once the local anesthetics wears off, you could experience discomfort, possibly more than usual, for up to 10 (ten) days. In the case of radiofrequencies, it may last up to 6 weeks. Surgeries may take up to 8 weeks for the healing process. The discomfort is due to the irritation caused by needles going through skin and muscle. To minimize the discomfort, we recommend using ice the first day, and heat from then on. The ice should be applied for 15 minutes on, and 15 minutes off. Keep repeating this cycle until bedtime. Avoid applying the ice directly to the skin, to prevent frostbite. Heat should be used daily, until the pain improves (4-10 days). Be careful not to burn yourself.  Occasionally you may experience muscle spasms or cramps. These occur as a consequence of the irritation caused by the needle sticks to the muscle and the blood that will inevitably be lost into the surrounding muscle tissue. Blood tends to be very irritating to tissues, which tend to react by going into spasm. These spasms may start the same day of your procedure, but they may also take days to develop. This late onset type of spasm or cramp is usually caused by electrolyte imbalances triggered by the steroids, at the level of the kidney. Cramps and spasms tend to respond well to muscle  relaxants, multivitamins (some are triggered by the procedure, but may have their origins in vitamin deficiencies), and "Gatorade", or any sports drinks that can replenish any electrolyte imbalances. (If you are a diabetic, ask your pharmacist to get you a sugar-free brand.) Warm showers or baths may also be helpful. Stretching exercises are highly recommended. General Instructions:  Be alert for signs of possible infection: redness, swelling, heat, red streaks, elevated temperature, and/or fever. These typically appear 4 to 6 days after the procedure. Immediately notify your doctor if you experience unusual bleeding, difficulty breathing, or loss of bowel or bladder control. If you experience increased pain, do not increase your pain medicine intake, unless instructed by your pain physician. Post-Procedure Care:  Be careful in moving about. Muscle spasms in the area of the injection may occur. Applying ice or heat to the area is often helpful. The incidence of spinal headaches after epidural injections ranges between 1.4% and 6%. If you develop a headache that does not seem to respond to conservative therapy, please let your physician know. This can be treated with an epidural blood patch.   Post-procedure numbness or redness is to be expected, however it should average 4 to 6 hours. If numbness and weakness of your extremities begins to develop 4 to 6 hours after your procedure, and is felt to be progressing and worsening, immediately contact your physician.   Diet:  If you experience nausea, do not eat until this sensation goes away. If you had a "Stellate Ganglion Block" for upper extremity "Reflex Sympathetic  Dystrophy", do not eat or drink until your hoarseness goes away. In any case, always start with liquids first and if you tolerate them well, then slowly progress to more solid foods. Activity:  For the first 4 to 6 hours after the procedure, use caution in moving about as you may experience  numbness and/or weakness. Use caution in cooking, using household electrical appliances, and climbing steps. If you need to reach your Doctor call our office: [phone]) [phone] Monday-Thursday 8:00 am - 4:00 PM    Fridays: Closed     In case of an emergency: In case of emergency, call 911 or go to the nearest emergency room and have the physician there call us.  Interpretation of Procedure Every nerve block has two components: a diagnostic component, and a treatment component. Unrealistic expectations are the most common causes of "perceived failure".  In a perfect world, a single nerve block should be able to completely and permanently eliminate the pain. Sadly, the world is not perfect.  Most pain management nerve blocks are performed using local anesthetics and steroids. Steroids are responsible for any long-term benefit that you may experience. Their purpose is to decrease any chronic swelling that may exist in the area. Steroids begin to work immediately after being injected. However, most patients will not experience any benefits until 5 to 10 days after the injection, when the swelling has come down to the point where they can tell a difference. Steroids will only help if there is swelling to be treated. As such, they can assist with the diagnosis. If effective, they suggest an inflammatory component to the pain, and if ineffective, they rule out inflammation as the main cause or component of the problem. If the problem is one of mechanical compression, you will get no benefit from those steroids.   In the case of local anesthetics, they have a crucial role in the diagnosis of your condition. Most will begin to work within15 to 20 minutes after injection. The duration will depend on the type used (short- vs. Long-acting). It is of outmost importance that patients keep tract of their pain, after the procedure. To assist with this matter, a "Post-procedure Pain Diary" is provided. Make sure to complete  it and to bring it back to your follow-up appointment.  As long as the patient keeps accurate, detailed records of their symptoms after every procedure, and returns to have those interpreted, every procedure will provide [place] with invaluable information. Even a block that does not provide the patient with any relief, will always provide [place] with information about the mechanism and the origin of the pain. The only time a nerve block can be considered a waste of time is when patients do not keep track of the results, or do not keep their post-procedure appointment.  Reporting the results back to your physician The Pain Score  Pain is a subjective complaint. It cannot be seen, touched, or measured. We depend entirely on the patient's report of the pain in order to assess your condition and treatment. To evaluate the pain, we use a pain scale, where "0" means "No Pain", and a "10" is "the worst possible pain that you can even imagine" (i.e. something like been eaten alive by a shark or being torn apart by a lion).   You will frequently be asked to rate your pain. Please be as accurate, remember that medical decisions will be based on your responses. Please do not rate your pain above a 10. Doing so  is actually interpreted as "symptom magnification" (exaggeration), as well as lack of understanding with regards to the scale. To put this into perspective, when you tell us that your pain is at a 10 (ten), what you are saying is that there is nothing we can do to make this pain any worse. (Carefully think about that.) Opioid Overdose Opioids are drugs that are often used to treat pain. Opioids include illegal drugs, such as heroin, as well as prescription pain medicines, such as codeine, morphine, hydrocodone, and fentanyl. An opioid overdose happens when you take too much of an opioid. An overdose may be intentional or accidental and can happen with any type of opioid. The effects of an overdose can be mild,  dangerous, or even deadly. Opioid overdose is a medical emergency. What are the causes? This condition may be caused by: Taking too much of an opioid on purpose. Taking too much of an opioid by accident. Using two or more substances that contain opioids at the same time. Taking an opioid with a substance that affects your heart, breathing, or blood pressure. These include alcohol, tranquilizers, sleeping pills, illegal drugs, and some over-the-counter medicines. This condition may also happen due to an error made by: A health care provider who prescribes a medicine. The pharmacist who fills the prescription. What increases the risk? This condition is more likely in: Children. They may be attracted to colorful pills. Because of a child's small size, even a small amount of a medicine can be dangerous. Older people. They may be taking many different medicines. Older people may have difficulty reading labels or remembering when they last took their medicines. They may also be more sensitive to the effects of opioids. People with chronic medical conditions, especially heart, liver, kidney, or neurological diseases. People who take an opioid for a long period of time. People who take opioids and use illegal drugs, such as heroin, or other substances, such as alcohol. People who: Have a history of drug or alcohol abuse. Have certain mental health conditions. Have a history of previous drug overdoses. People who take opioids that are not prescribed for them. What are the signs or symptoms? Symptoms of this condition depend on the type of opioid and the amount that was taken. Common symptoms include: Sleepiness or difficulty waking from sleep. Confusion. Slurred speech. Slowed breathing and a slow pulse (bradycardia). Nausea and vomiting. Abnormally small pupils. Signs and symptoms that require emergency treatment include: Cold, clammy, and pale skin. Blue lips and  fingernails. Vomiting. Gurgling sounds in the throat. A pulse that is very slow or difficult to detect. Breathing that is very irregular, slow, noisy, or difficult to detect. Inability to respond to speech or be awakened from sleep (stupor). Seizures. How is this diagnosed? This condition is diagnosed based on your symptoms and medical history. It is important to tell your health care provider: About all of the opioids that you took. When you took the opioids. Whether you were drinking alcohol or using marijuana, cocaine, or other drugs. Your health care provider will do a physical exam. This exam may include: Checking and monitoring your heart rate and rhythm, breathing rate, temperature, and blood pressure. Measuring oxygen levels in your blood. Checking for abnormally small pupils. You may also have blood tests or urine tests. You may have X-rays if you are having severe breathing problems. How is this treated? This condition requires immediate medical treatment and hospitalization. Reversing the effects of the opioid is the first step in treatment.  If you have a Narcan kit or naloxone, use it right away. Follow your health care provider's instructions. A friend or family member can also help you with this. The rest of your treatment will be given in the hospital intensive care (ICU). Treatment in the hospital may include: Giving salts and minerals (electrolytes) along with fluids through an IV. Inserting a breathing tube (endotracheal tube) in your airway to help you breathe if you cannot breathe on your own or you are in danger of not being able to breathe on your own. Giving oxygen through a small tube under your nose. Passing a tube through your nose and into your stomach (nasogastric tube, or NG tube) to empty your stomach. Giving medicines that: Increase your blood pressure. Relieve nausea and vomiting. Relieve abdominal pain and cramping. Reverse the effects of the opioid  (naloxone). Monitoring your heart and oxygen levels. Ongoing counseling and mental health support if you intentionally overdosed or used an illegal drug. Follow these instructions at home:  Medicines Take over-the-counter and prescription medicines only as told by your health care provider. Always ask your health care provider about possible side effects and interactions of any new medicine that you start taking. Keep a list of all the medicines that you take, including over-the-counter medicines. Bring this list with you to all your medical visits. General instructions Drink enough fluid to keep your urine pale yellow. Keep all follow-up visits. This is important. How is this prevented? Read the drug inserts that come with your opioid pain medicines. Take medicines only as told by your health care provider. Do not take more medicine than you are told. Do not take medicines more frequently than you are told. Do not drink alcohol or take sedatives when taking opioids. Do not use illegal or recreational drugs, including cocaine, ecstasy, and marijuana. Do not take opioid medicines that are not prescribed for you. Store all medicines in safety containers that are out of the reach of children. Get help if you are struggling with: Alcohol or drug use. Depression or another mental health problem. Thoughts of hurting yourself or another person. Keep the phone number of your local poison control center near your phone or in your mobile phone. In the [place] the hotline of the [facility] is 302-129-5232. If you were prescribed naloxone, make sure you understand how to take it. Contact a health care provider if: You need help understanding how to take your pain medicines. You feel your medicines are too strong. You are concerned that your pain medicines are not working well for your pain. You develop new symptoms or side effects when you are taking medicines. Get help right  away if: You or someone else is having symptoms of an opioid overdose. Get help even if you are not sure. You have thoughts about hurting yourself or others. You have: Chest pain. Difficulty breathing. A loss of consciousness. These symptoms may represent a serious problem that is an emergency. Do not wait to see if the symptoms will go away. Get medical help right away. Call your local emergency services (911 in the [place] Do not drive yourself to the hospital. If you ever feel like you may hurt yourself or others, or have thoughts about taking your own life, get help right away. You can go to your nearest emergency department or: Call your local emergency services (911 in the [place] Call a suicide crisis helpline, such as the [address] at 615-691-1285 or (657) 826-0469  in the [place] This is open 24 hours a day in the [place] Text the Crisis Text Line at 787-585-9186 (in the [place] Summary Opioids are drugs that are often used to treat pain. Opioids include illegal drugs, such as heroin, as well as prescription pain medicines. An opioid overdose happens when you take too much of an opioid. Overdoses can be intentional or accidental. Opioid overdose is very dangerous. It is a life-threatening emergency. If you or someone you know is experiencing an opioid overdose, get help right away. This information is not intended to replace advice given to you by your health care provider. Make sure you discuss any questions you have with your health care provider. Document Revised: 08/29/2020 Document Reviewed: 05/16/2020 [employer] Patient Education  [place].

## 2022-04-09 NOTE — Progress Notes (Signed)
Safety precautions to be maintained throughout the outpatient stay will include: orient to surroundings, keep bed in low position, maintain call bell within reach at all times, provide assistance with transfer out of bed and ambulation.  

## 2022-04-09 NOTE — Progress Notes (Signed)
PROVIDER NOTE: Interpretation of information contained herein should be left to medically-trained personnel. Specific patient instructions are provided elsewhere under "Patient Instructions" section of medical record. This document was created in part using STT-dictation technology, any transcriptional errors that may result from this process are unintentional.  Patient: Brian Spencer Type: Established DOB: [birth year] MRN: [id number] PCP: [facility]  Service: Procedure DOS: 04/09/2022 Setting: Ambulatory Location: Ambulatory outpatient facility Delivery: Face-to-face Provider: Gaspar Cola, MD Specialty: Interventional Pain Management Specialty designation: 09 Location: Outpatient facility Ref. Prov.: [facility]       Interventional Therapy   Primary Reason for Visit: Interventional Pain Management Treatment. CC: Back Pain (lower)  Procedure:          Type: Management of Intrathecal Drug Delivery System (IDDS) - Analysis & Programming [phone]).  Fentanyl changed back to hydromorphone.   Indications: 1. Chronic pain syndrome   2. Chronic low back pain (1ry area of Pain) (Bilateral) (R>L)   3. Chronic lower extremity pain (2ry area of Pain) (Right)   4. Failed back surgical syndrome   5. DDD (degenerative disc disease), lumbosacral   6. Fusion of lumbar spine   7. Presence of intrathecal pump   8. Encounter for adjustment or management of infusion pump   9. Encounter for therapeutic procedure   10. Pharmacologic therapy   11. Encounter for medication management   12. Encounter for chronic pain management    Pain Assessment: Self-Reported Pain Score: 6 /10             Reported level is compatible with observation.        Today we have changed the medication in the pump back to hydromorphone from the fentanyl that we had running in it.  Because of the changes in the medications, we had to enter a bridge bolus which will take 48 hours to get to the new medicine based  on the dose that we have started him on today.  Yesterday I sent a prescription to his pharmacy for oxycodone so as to avoid any kind of side effects (withdrawals) from the change in the running concentrations.  Unfortunately, as it is usually the case, the patient is already predicting that he is going to have withdrawals and significant side effects to this change.  He tends to talk himself into believing that he will be experiencing side effects without really given any of this time to work.  This is part of the reason why it has been so difficult to manage his pump since it has become very clear to me that he will never be happy with the therapy.  Part of this problem is the unrealistic expectations that he thinks any of this is going to provide him with complete relief of the pain and resolution of all of these problems.  The other side of the neck going is the fact that he wants to micromanage his treatment and he really does not have the patience or knowledge to manage these complex therapies.  As always, he has been told that he can give [place] a call if he experiences any problems or has any questions.   Intrathecal Drug Delivery System (IDDS)  Pump Device:  Manufacturer: Medtronic Model: Synchromed II Model No.: [id number] Serial No.: [id number] Delivery Route: Intrathecal Type: Programmable  Volume (mL): 40 mL reservoir Priming Volume: 0.207 mL  Calibration Constant: 117.0  MRI compatibility: Conditional   Implant Details:  Date: 10/08/20 (Third Pump) Implanter: Dr. Deetta Perla (Last)  Contact Information: [facility], [place], [place] Last Revision/Replacement: 10/08/2020 Estimated Replacement Date: May 2029  Implant Site: Abdominal Laterality: Right  Catheter: Manufacturer: Medtronic Model:  n/a  Model No.: 8731  Serial No.: n/a  Implanted Length (cm): 94.1  Catheter Volume (mL): 0.207  Tip Location (Level): T10 Canal Access Site: L2-3  Old Drug content:   Primary Medication Class: Opioid analgesic Medication: PF-Fentanyl Concentration: 2000 mcg/mL   Secondary Medication Class: Local anesthetic Medication: PF-bupivacaine Concentration: 20 mg/mL  Tertiary Medication Class: Nonopioid analgesic Medication: PF-clonidine Concentration: 10 mcg/mL  Fourth Medication Class: none  Medication: n/a  Concentration: n/a  New Drug content:  Primary Medication Class: Opioid analgesic Medication: PF-Hydromorphone (Dilaudid) Concentration: 20 mg/mL   Secondary Medication Class: Local anesthetic Medication: PF-bupivacaine Concentration: 40 mg/mL  Tertiary Medication Class: Nonopioid analgesic Medication: PF-clonidine Concentration: 40 mcg/mL  Fourth Medication Class: none  Medication: n/a  Concentration: n/a  PTC/PCA parameters:  Mode: Off/Inactive  Programming:  Type: Simple continuous.  Medication, Concentration, Infusion Program, & Delivery Rate: For up-to-date details please see most recent scanned programming printout.      Changes:  Medication Change:  Fentanyl changed to hydromorphone. Concentrations of all medications changed. Please see above. Rate Change:  Hydromorphone (primary) set at 3.0 mg/day. Please see printout.  Reported side-effects or adverse reactions: None reported  Effectiveness: Described as relatively effective, allowing for increase in activities of daily living (ADL) Clinically meaningful improvement in function (CMIF): Sustained CMIF goals met  Plan: Pump refill today   Pharmacotherapy Assessment   Opioid Analgesic: Intrathecal Hydromorphone (Dilaudid) @ 2.399 mg/d. MME: 9.6 mg/day.   Monitoring: [place] PMP: PDMP reviewed during this encounter.       Pharmacotherapy: No side-effects or adverse reactions reported. Compliance: No problems identified. Effectiveness: Clinically acceptable. Plan: Refer to "POC". UDS:  Summary  Date Value Ref Range Status  11/12/2015 FINAL  Final    Comment:     ==================================================================== [facility] COMP DRUG ANALYSIS,UR ==================================================================== Test                             Result       Flag       Units Drug Present and Declared for Prescription Verification   7-aminoclonazepam              82           EXPECTED   ng/mg creat    7-aminoclonazepam is an expected metabolite of clonazepam. Source    of clonazepam is a scheduled prescription medication.   Trazodone                      PRESENT      EXPECTED   1,3 chlorophenyl piperazine    PRESENT      EXPECTED    1,3-chlorophenyl piperazine is an expected metabolite of    trazodone.   Verapamil                      PRESENT      EXPECTED Drug Present not Declared for Prescription Verification   Hydromorphone                  1656         UNEXPECTED ng/mg creat    Hydromorphone may be administered as a scheduled prescription    medication; it is also an expected metabolite of hydrocodone. Drug Absent but Declared for Prescription Verification  Venlafaxine                    Not Detected UNEXPECTED ==================================================================== Test                      Result    Flag   Units      Ref Range   Creatinine              73               mg/dL      >=20 ==================================================================== Declared Medications:  The flagging and interpretation on this report are based on the  following declared medications.  Unexpected results may arise from  inaccuracies in the declared medications.  **Note: The testing scope of this panel includes these medications:  Clonazepam (Klonopin)  Trazodone (Desyrel)  Venlafaxine (Effexor)  Verapamil (Calan)  **Note: The testing scope of this panel does not include following  reported medications:  Albuterol  Budenoside (Symbicort)  Chlorthalidone  Formoterol (Symbicort)  Multivitamin  Tamsulosin (Flomax)   Tiotropium (Spiriva)  Ubiquinone (Coenzyme Q 10)  Vitamin D2 (Ergocalciferol) ==================================================================== For clinical consultation, please call [phone]. ====================================================================    No results found for: "CBDTHCR", "D8THCCBX", "D9THCCBX"   Pre-op H&P Assessment:  Mr. Bedrosian is a 72 y.o. (year old), male patient, seen today for interventional treatment. 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 Mr. Berchtold has a current medication list which includes the following prescription(s): acetaminophen, albuterol, albuterol, amlodipine, apixaban, atorvastatin, clonazepam, neuriva plus, empagliflozin, magnesium oxide, metformin, multivitamin, naloxone, naloxone, NON FORMULARY, oxycodone-acetaminophen, oxygen-helium, pantoprazole, polyvinyl alcohol, potassium chloride sa, pregabalin, propranolol, tamsulosin, tiotropium bromide-olodaterol, torsemide, trazodone, venlafaxine xr, and ondansetron. His primarily concern today is the Back Pain (lower)  Initial Vital Signs:  Pulse/HCG Rate: 87  Temp: (!) 97.3 F (36.3 C) Resp:    BP: (!) 145/77 SpO2: 96 %  BMI: Estimated body mass index is 38.27 kg/m as calculated from the following:   Height as of this encounter: 5' 5"$  (1.651 m).   Weight as of this encounter: 230 lb (104.3 kg).  Risk Assessment: Allergies: Reviewed. He is allergic to baclofen, hydralazine, and gabapentin.  Allergy Precautions: None required Coagulopathies: Reviewed. None identified.  Blood-thinner therapy: None at this time Active Infection(s): Reviewed. None identified. Mr. Santillana is afebrile  Site Confirmation: Mr. Serino was asked to confirm the procedure and laterality before marking the site Procedure checklist: Completed Consent: Before the procedure and under the influence of no sedative(s), amnesic(s), or anxiolytics, the patient was informed of the treatment options, risks and possible complications. To fulfill our ethical and legal obligations, as recommended by the American Medical Association's Code of Ethics, I have informed the patient of my clinical impression; the nature and purpose of the treatment or procedure; the risks, benefits, and possible complications of the intervention; the alternatives, including doing nothing; the risk(s) and benefit(s)  of the alternative treatment(s) or procedure(s); and the risk(s) and benefit(s) of doing nothing.  Mr. Cirelli was provided with information about the general risks and possible complications associated with most interventional procedures. These include, but are not limited to: failure to achieve desired goals, infection, bleeding, organ or nerve damage, allergic reactions, paralysis, and/or death.  In addition, he was informed of those risks and possible complications associated to this particular procedure, which include, but are not limited to: damage to the implant; failure to decrease pain; local, systemic, or serious CNS infections, intraspinal abscess with possible cord compression and paralysis, or life-threatening such as meningitis;  bleeding; organ damage; nerve injury or damage with subsequent sensory, motor, and/or autonomic system dysfunction, resulting in transient or permanent pain, numbness, and/or weakness of one or several areas of the body; allergic reactions, either minor or major life-threatening, such as anaphylactic or anaphylactoid reactions.  Furthermore, Mr. Kaczka was informed of those risks and complications associated with the medications. These include, but are not limited to: allergic reactions (i.e.: anaphylactic or anaphylactoid reactions); endorphine suppression; bradycardia and/or hypotension; water retention and/or peripheral vascular relaxation leading to lower extremity edema and possible stasis ulcers; respiratory depression and/or shortness of breath; decreased metabolic rate leading to weight gain; swelling or edema; medication-induced neural toxicity; particulate matter embolism and blood vessel occlusion with resultant organ, and/or nervous system infarction; and/or intrathecal granuloma formation with possible spinal cord compression and permanent paralysis.  Before refilling the pump Mr. Kluesner was informed that some of the medications used in the devise may not be FDA approved for such use and therefore it constitutes an off-label use of the medications.  Finally, he was informed that Medicine is not an exact science; therefore, there is also the possibility of unforeseen or unpredictable risks and/or possible complications that may result in a catastrophic outcome. The patient indicated having understood very clearly. We have given the patient no guarantees and we have made no promises. Enough time was given to the patient to ask questions, all of which were answered to the patient's satisfaction. Mr. Bybee has indicated that he wanted to continue with the procedure. Attestation: I, the ordering provider, attest that I have discussed with the patient the benefits, risks, side-effects, alternatives,  likelihood of achieving goals, and potential problems during recovery for the procedure that I have provided informed consent. Date  Time: 04/09/2022 10:50 AM  Pre-Procedure Preparation:  Monitoring: As per clinic protocol. Respiration, ETCO2, SpO2, BP, heart rate and rhythm monitor placed and checked for adequate function Safety Precautions: Patient was assessed for positional comfort and pressure points before starting the procedure. Time-out: I initiated and conducted the "Time-out" before starting the procedure, as per protocol. The patient was asked to participate by confirming the accuracy of the "Time Out" information. Verification of the correct person, site, and procedure were performed and confirmed by me, the nursing staff, and the patient. "Time-out" conducted as per Joint Commission's Universal Protocol (UP.01.01.01). Time: 1108  Description of Procedure:          Position: Supine Target Area: Central-port of intrathecal pump. Approach: Anterior, 90 degree angle approach. Area Prepped: Entire Area around the pump implant. DuraPrep (Iodine Povacrylex [0.7% available iodine] and Isopropyl Alcohol, 74% w/w) Safety Precautions: Aspiration looking for blood return was conducted prior to all injections. At no point did we inject any substances, as a needle was being advanced. No attempts were made at seeking any paresthesias. Safe injection practices and needle disposal techniques used. Medications properly  checked for expiration dates. SDV (single dose vial) medications used. Description of the Procedure: Protocol guidelines were followed. Two nurses trained to do implant refills were present during the entire procedure. The refill medication was checked by both healthcare providers as well as the patient. The patient was included in the "Time-out" to verify the medication. The patient was placed in position. The pump was identified. The area was prepped in the usual manner. The sterile  template was positioned over the pump, making sure the side-port location matched that of the pump. Both, the pump and the template were held for stability. The needle provided in the Medtronic Kit was then introduced thru the center of the template and into the central port. The pump content was aspirated and discarded volume documented. The new medication was slowly infused into the pump, thru the filter, making sure to avoid overpressure of the device. The needle was then removed and the area cleansed, making sure to leave some of the prepping solution back to take advantage of its long term bactericidal properties. The pump was interrogated and programmed to reflect the correct medication, volume, and dosage. The program was printed and taken to the physician for approval. Once checked and signed by the physician, a copy was provided to the patient and another scanned into the EMR.  Vitals:   04/09/22 1048  BP: (!) 145/77  Pulse: 87  Temp: (!) 97.3 F (36.3 C)  SpO2: 96%  Weight: 230 lb (104.3 kg)  Height: 5' 5"$  (1.651 m)    Start Time: 1108 hrs. End Time: 1117 hrs. Materials & Medications: Medtronic Refill Kit Medication(s): Please see chart orders for details.  Imaging Guidance:          Type of Imaging Technique: None used Indication(s): N/A Exposure Time: No patient exposure Contrast: None used. Fluoroscopic Guidance: N/A Ultrasound Guidance: N/A Interpretation: N/A  Antibiotic Prophylaxis:   Anti-infectives (From admission, onward)    None      Indication(s): None identified  Post-operative Assessment:  Post-procedure Vital Signs:  Pulse/HCG Rate: 87  Temp: (!) 97.3 F (36.3 C) Resp:   BP: (!) 145/77 SpO2: 96 %  EBL: None  Complications: No immediate post-treatment complications observed by team, or reported by patient.  Note: The patient tolerated the entire procedure well. A repeat set of vitals were taken after the procedure and the patient was kept under  observation following institutional policy, for this type of procedure. Post-procedural neurological assessment was performed, showing return to baseline, prior to discharge. The patient was provided with post-procedure discharge instructions, including a section on how to identify potential problems. Should any problems arise concerning this procedure, the patient was given instructions to immediately contact us, at any time, without hesitation. In any case, we plan to contact the patient by telephone for a follow-up status report regarding this interventional procedure.  Comments:  No additional relevant information.  Plan of Care (POC)  Orders:  Orders Placed This Encounter  Procedures   PUMP REFILL    Maintain Protocol by having two(2) healthcare providers during procedure and programming.    Scheduling Instructions:     Please refill intrathecal pump today.    Order Specific Question:   Where will this procedure be performed?    Answer:   [facility]   PUMP REFILL    Whenever possible schedule on a procedure today.    Standing Status:   Future    Standing Expiration Date:   08/08/2022  Scheduling Instructions:     Please schedule intrathecal pump refill based on pump programming. Avoid schedule intervals of more than 120 days (4 months).    Order Specific Question:   Where will this procedure be performed?    Answer:   [facility]   PUMP REPROGRAM    Follow programming protocol by having two(2) healthcare providers present during programming.    Scheduling Instructions:     Please perform the following adjustment:      PF-Hydromorphone 20 mg/ml (set as primary medication)     PF-Bupivacaine 40 mg/ml     PF-Clonidine 40 mcg/ml     Rate: To run at 3.0 mg/day of Hydromorphone (Primary)    Order Specific Question:   Where will this procedure be performed?    Answer:   [facility]   Informed Consent Details: Physician/Practitioner Attestation; Transcribe to  consent form and obtain patient signature    Transcribe to consent form and obtain patient signature.    Order Specific Question:   Physician/Practitioner attestation of informed consent for procedure/surgical case    Answer:   I, the physician/practitioner, attest that I have discussed with the patient the benefits, risks, side effects, alternatives, likelihood of achieving goals and potential problems during recovery for the procedure that I have provided informed consent.    Order Specific Question:   Procedure    Answer:   Intrathecal pump refill    Order Specific Question:   Physician/Practitioner performing the procedure    Answer:   Attending Physician: Kathlen Brunswick. Dossie Arbour, MD & designated trained staff    Order Specific Question:   Indication/Reason    Answer:   Chronic Pain Syndrome (G89.4), presence of an intrathecal pump (Z97.8)   Chronic Opioid Analgesic:  Intrathecal Hydromorphone (Dilaudid) @ 2.399 mg/d. MME: 9.6 mg/day.   Medications ordered for procedure: Meds ordered this encounter  Medications   naloxone (NARCAN) nasal spray 4 mg/0.1 mL    Sig: Place 1 spray into the nose as needed for up to 365 doses (for opioid-induced respiratory depresssion). In case of emergency (overdose), spray once into each nostril. If no response within 3 minutes, repeat application and call [phone].    Dispense:  1 each    Refill:  0    Instruct patient in proper use of device.   Medications administered: Javen Baldassare had no medications administered during this visit.  See the medical record for exact dosing, route, and time of administration.  Follow-up plan:   No follow-ups on file.       Interventional Therapies  Risk Factors  Considerations:   NOTE: ELIQUIS ANTICOAGULATION (Stop: 3days  Re-start: 6 hrs) Hx of PE AAA  CHF  MO  GERD  OSA  Chronic Hepatitis C  COPD  Smoker  T2DM  HTN  GAD  (R) Lobectomy  Depression/Anxiety  PTSD  FBSS w/ Fusion Hardware  Unrealistic  Expectations         Planned  Pending:   (10/17/2021) we plan to decrease the concentration of his intrathecal bupivacaine from 30 mg/mL to 20 mg/mL on his next refill.   Under consideration:   Continue management of intrathecal pump    Completed:   ITP Patient requested to come off of all opioids in pump (01/20/2019) Downward taper started. ITP Patient indicates interest in seeing how it would feel w/o pump. He is considering explant. (08/11/2019)  ITP replaced due to end-of-battery life (10/08/2020)  ITP Medication changed from Hydromorphone to Fentanyl (08/06/2021)  ITP Patient requested to increase opioids in pump after experiencing withdrawals (08/12/2021)  ITP Medication changed from Fentanyl to Hydromorphone (04/09/2022)    Completed by Other Providers:   Lumbar Facet Blocks  Lumbar Facet RFA  LESIs  Lumbar Fusion  Pump Implant    Therapeutic  Palliative (PRN) options:   Continue management of intrathecal pump          Recent Visits Date Type Provider Dept  04/08/22 Procedure visit Milinda Pointer, [facility]  01/21/22 Procedure visit Milinda Pointer, MD [facility]  Showing recent visits within past 90 days and meeting all other requirements Today's Visits Date Type Provider Dept  04/09/22 Procedure visit Milinda Pointer, MD [facility]  Showing today's visits and meeting all other requirements Future Appointments No visits were found meeting these conditions. Showing future appointments within next 90 days and meeting all other requirements  Disposition: Discharge home  Discharge (Date  Time): 04/09/2022; 1130 hrs.   Primary Care Physician: [facility] Location: [facility] Note by: Gaspar Cola, MD (TTS technology used. I apologize for any typographical errors that were not detected and corrected.) Date: 04/09/2022; Time: 12:38 PM  Disclaimer:  Medicine is not an Armed forces logistics/support/administrative officer. The only guarantee in medicine is that nothing is guaranteed. It is important to note that the decision to proceed with this intervention was based on the information collected from the patient. The Data and conclusions were drawn from the patient's questionnaire, the interview, and the physical examination. Because the information was provided in large part by the patient, it cannot be guaranteed that it has not been purposely or unconsciously manipulated. Every effort has been made to obtain as much relevant data as possible for this evaluation. It is important to note that the conclusions that lead to this procedure are derived in large part from the available data. Always take into account that the treatment will also be dependent on availability of resources and existing treatment guidelines, considered by other Pain Management Practitioners as being common knowledge and practice, at the time of the intervention. For Medico-Legal purposes, it is also important to point out that variation in procedural techniques and pharmacological choices are the acceptable norm. The indications, contraindications, technique, and results of the above procedure should only be interpreted and judged by a Board-Certified Interventional Pain Specialist with extensive familiarity and expertise in the same exact procedure and technique.

## 2022-04-10 ENCOUNTER — Telehealth: Payer: Self-pay

## 2022-04-10 NOTE — Telephone Encounter (Signed)
Post IT pump refill.  Patient states he is doing well

## 2022-04-11 ENCOUNTER — Telehealth: Payer: Self-pay | Admitting: Pain Medicine

## 2022-04-11 NOTE — Telephone Encounter (Signed)
PT stated that he started to have withdrawals about 6pm. PT stated that he took Gabapentin and it's not helping. Please give patient a call .TY

## 2022-04-11 NOTE — Telephone Encounter (Signed)
Patient states he is starting to feel better.  Instructed to call if he is having concerns.

## 2022-06-24 ENCOUNTER — Other Ambulatory Visit: Payer: Self-pay

## 2022-06-24 MED ORDER — PAIN MANAGEMENT IT PUMP REFILL: 1.0000 | Freq: Once | INTRATHECAL | 0 refills | Status: AC

## 2022-07-17 ENCOUNTER — Encounter: Payer: Self-pay | Admitting: Pain Medicine

## 2022-07-29 ENCOUNTER — Other Ambulatory Visit: Payer: Self-pay

## 2022-07-29 MED ORDER — PAIN MANAGEMENT IT PUMP REFILL: 1.0000 | Freq: Once | INTRATHECAL | 0 refills | Status: AC

## 2022-07-31 ENCOUNTER — Encounter: Payer: Self-pay | Admitting: Pain Medicine

## 2022-07-31 ENCOUNTER — Ambulatory Visit: Payer: Worker's Compensation | Attending: Pain Medicine | Admitting: Pain Medicine

## 2022-07-31 VITALS — BP 120/76 | HR 77 | Temp 97.7°F | Resp 18 | Ht 65.0 in | Wt 231.0 lb

## 2022-07-31 DIAGNOSIS — Z451 Encounter for adjustment and management of infusion pump: Secondary | ICD-10-CM | POA: Diagnosis present

## 2022-07-31 DIAGNOSIS — M961 Postlaminectomy syndrome, not elsewhere classified: Secondary | ICD-10-CM | POA: Insufficient documentation

## 2022-07-31 DIAGNOSIS — M79604 Pain in right leg: Secondary | ICD-10-CM | POA: Insufficient documentation

## 2022-07-31 DIAGNOSIS — M4326 Fusion of spine, lumbar region: Secondary | ICD-10-CM | POA: Insufficient documentation

## 2022-07-31 DIAGNOSIS — K036 Deposits [accretions] on teeth: Secondary | ICD-10-CM | POA: Insufficient documentation

## 2022-07-31 DIAGNOSIS — M5137 Other intervertebral disc degeneration, lumbosacral region: Secondary | ICD-10-CM | POA: Insufficient documentation

## 2022-07-31 DIAGNOSIS — Z7182 Exercise counseling: Secondary | ICD-10-CM | POA: Insufficient documentation

## 2022-07-31 DIAGNOSIS — M94261 Chondromalacia, right knee: Secondary | ICD-10-CM | POA: Insufficient documentation

## 2022-07-31 DIAGNOSIS — N23 Unspecified renal colic: Secondary | ICD-10-CM | POA: Insufficient documentation

## 2022-07-31 DIAGNOSIS — S80829A Blister (nonthermal), unspecified lower leg, initial encounter: Secondary | ICD-10-CM | POA: Insufficient documentation

## 2022-07-31 DIAGNOSIS — Z5189 Encounter for other specified aftercare: Secondary | ICD-10-CM | POA: Diagnosis present

## 2022-07-31 DIAGNOSIS — H25812 Combined forms of age-related cataract, left eye: Secondary | ICD-10-CM | POA: Insufficient documentation

## 2022-07-31 DIAGNOSIS — Z658 Other specified problems related to psychosocial circumstances: Secondary | ICD-10-CM | POA: Insufficient documentation

## 2022-07-31 DIAGNOSIS — G894 Chronic pain syndrome: Secondary | ICD-10-CM

## 2022-07-31 DIAGNOSIS — M5441 Lumbago with sciatica, right side: Secondary | ICD-10-CM | POA: Diagnosis present

## 2022-07-31 DIAGNOSIS — R2689 Other abnormalities of gait and mobility: Secondary | ICD-10-CM | POA: Insufficient documentation

## 2022-07-31 DIAGNOSIS — Z978 Presence of other specified devices: Secondary | ICD-10-CM | POA: Diagnosis present

## 2022-07-31 DIAGNOSIS — Z961 Presence of intraocular lens: Secondary | ICD-10-CM | POA: Insufficient documentation

## 2022-07-31 DIAGNOSIS — H2513 Age-related nuclear cataract, bilateral: Secondary | ICD-10-CM | POA: Insufficient documentation

## 2022-07-31 DIAGNOSIS — G8929 Other chronic pain: Secondary | ICD-10-CM | POA: Insufficient documentation

## 2022-07-31 DIAGNOSIS — I872 Venous insufficiency (chronic) (peripheral): Secondary | ICD-10-CM | POA: Insufficient documentation

## 2022-07-31 NOTE — Patient Instructions (Signed)

## 2022-07-31 NOTE — Progress Notes (Signed)
PROVIDER NOTE: Interpretation of information contained herein should be left to medically-trained personnel. Specific patient instructions are provided elsewhere under "Patient Instructions" section of medical record. This document was created in part using STT-dictation technology, any transcriptional errors that may result from this process are unintentional.  Patient: Brian Spencer Type: Established DOB: 12/28/1950 MRN: [id number] P[facility]: [facility]  Service: Procedure DOS: 07/31/2022 Setting: Ambulatory Location: Ambulatory outpatient facility Delivery: Face-to-face Provider: Oswaldo Done, MD Specialty: Interventional Pain Management Specialty designation: 09 Location: Outpatient facility Ref. Prov.: [facility]       Interventional Therapy   Primary Reason for Visit: Interventional Pain Management Treatment. CC: Back Pain (low)  Procedure:          Type: Management of Intrathecal Drug Delivery System (IDDS) - Reservoir Refill (56213). No rate change.  Indications: 1. Chronic pain syndrome   2. Chronic low back pain (1ry area of Pain) (Bilateral) (R>L)   3. Chronic lower extremity pain (2ry area of Pain) (Right)   4. Failed back surgical syndrome   5. DDD (degenerative disc disease), lumbosacral   6. Fusion of lumbar spine   7. Presence of intrathecal pump   8. Encounter for adjustment or management of infusion pump   9. Encounter for therapeutic procedure    Pain Assessment: Self-Reported Pain Score: 5 /10             Reported level is compatible with observation.        Note: The patient indicates that the change that we made on the pump medication on 04/09/2022 completely solve the problem when he went back from the fentanyl to the hydromorphone.   Intrathecal Drug Delivery System (IDDS)  Pump Device:  Manufacturer: Medtronic Model: Synchromed II Model No.: [id number] Serial No.: [id number] H Delivery Route: Intrathecal Type: Programmable  Volume (mL): 40  mL reservoir Priming Volume: 0.207 mL  Calibration Constant: 117.0  MRI compatibility: Conditional   Implant Details:  Date: 10/08/20 (Third Pump) Implanter: Dr. Lucy Chris (Last)  Contact Information: [facility], [place], [place] Last Revision/Replacement: 10/08/2020 Estimated Replacement Date: May 2029  Implant Site: Abdominal Laterality: Right  Catheter: Manufacturer: Medtronic Model:  n/a  Model No.: [id number]  Serial No.: n/a  Implanted Length (cm): 94.1  Catheter Volume (mL): 0.207  Tip Location (Level): T10 Canal Access Site: L2-3  Drug content:  Primary Medication Class: Opioid analgesic Medication: PF-Hydromorphone (Dilaudid) Concentration: 20 mg/mL   Secondary Medication Class: Local anesthetic Medication: PF-bupivacaine Concentration: 40 mg/mL  Tertiary Medication Class: Nonopioid analgesic Medication: PF-clonidine Concentration: 40 mcg/mL  Fourth Medication Class: none  Medication: n/a  Concentration: n/a  PTC/PCA parameters:  Mode: Off/Inactive  Programming:  Type: Simple continuous.  Medication, Concentration, Infusion Program, & Delivery Rate: For up-to-date details please see most recent scanned programming printout.       Changes:  Medication Change: None at this point Rate Change: No change in rate  Reported side-effects or adverse reactions: None reported  Effectiveness: Described as relatively effective, allowing for increase in activities of daily living (ADL) Clinically meaningful improvement in function (CMIF): Sustained CMIF goals met  Plan: Pump refill today   Pharmacotherapy Assessment   Opioid Analgesic: Intrathecal Hydromorphone (Dilaudid) @ 2.399 mg/d. MME: 9.6 mg/day.   Monitoring: [place] PMP: PDMP reviewed during this encounter.       Pharmacotherapy: No side-effects or adverse reactions reported. Compliance: No problems identified. Effectiveness: Clinically acceptable. Plan: Refer to "POC". UDS:  Summary   Date Value Ref Range Status  11/12/2015  FINAL  Final    Comment:    ==================================================================== TOXAS[facility]RE COMP DRUG ANALYSIS,UR ==================================================================== Test                             Result       Flag       Units Drug Present and Declared for Prescription Verification   7-aminoclonazepam              82           EXPECTED   ng/mg creat    7-aminoclonazepam is an expected metabolite of clonazepam. Source    of clonazepam is a scheduled prescription medication.   Trazodone                      PRESENT      EXPECTED   1,3 chlorophenyl piperazine    PRESENT      EXPECTED    1,3-chlorophenyl piperazine is an expected metabolite of    trazodone.   Verapamil                      PRESENT      EXPECTED Drug Present not Declared for Prescription Verification   Hydromorphone                  1656         UNEXPECTED ng/mg creat    Hydromorphone may be administered as a scheduled prescription    medication; it is also an expected metabolite of hydrocodone. Drug Absent but Declared for Prescription Verification   Venlafaxine                    Not Detected UNEXPECTED ==================================================================== Test                      Result    Flag   Units      Ref Range   Creatinine              73               mg/dL      >=16 ==================================================================== Declared Medications:  The flagging and interpretation on this report are based on the  following declared medications.  Unexpected results may arise from  inaccuracies in the declared medications.  **Note: The testing scope of this panel includes these medications:  Clonazepam (Klonopin)  Trazodone (Desyrel)  Venlafaxine (Effexor)  Verapamil (Calan)  **Note: The testing scope of this panel does not include following  reported medications:  Albuterol  Budenoside (Symbicort)   Chlorthalidone  Formoterol (Symbicort)  Multivitamin  Tamsulosin (Flomax)  Tiotropium (Spiriva)  Ubiquinone (Coenzyme Q 10)  Vitamin D2 (Ergocalciferol) ==================================================================== For clinical consultation, please call [phone]. ====================================================================    No results found for: "CBDTHCR", "D8THCCBX", "D9THCCBX"   Pre-op H&P Assessment:  Mr. Gargan is a 72 y.o. (year old), male patient, seen today for interventional treatment. He  has a past surgical history that includes Laminectomy (10/07/2000); Lumbar fusion (01/07/2001); Insertion of Medtronic  Spinal Cord Stimulator (12/28/2001); REmoval of Spinal cord stimulator; bone spurs removal (Bilateral, 2004); Rotary cuff surgery (Right, 04/18/2003); arthroscopic knee surgery (Left, 12/12/2003); Joint replacement; left total knee replacement (Left, 06/18/2004); medtronic pain infusion pump implanted  (08/27/2006); Upper gi endoscopy (07/23/2007); bone spurs (Left, 09/07/2007); Colonoscopy (11/20/2007); chest pain  (01/29/2008); medtronic pump  stopped working (09/19/2008); Epidural block injection (02/20/2010); Implant  2, medtronic   bladder stimulators (06/11/2010); medtronic pain pump stimulator  (12/02/2013); Lobectomy (Right, 12/23/2013); Joint replacement (Right, 2018); and Intrathecal pump revision (Right, 10/08/2020). Mr. Mines has a current medication list which includes the following prescription(s): acetaminophen, albuterol, albuterol, amlodipine, apixaban, atorvastatin, bupropion, clonazepam, neuriva plus, empagliflozin, magnesium oxide, metformin, multivitamin, naloxone, naloxone, NON FORMULARY, oxygen-helium, pantoprazole, polyvinyl alcohol, potassium chloride sa, propranolol, tamsulosin, tiotropium bromide-olodaterol, torsemide, trazodone, venlafaxine xr, ondansetron, oxycodone-acetaminophen, and pregabalin. His primarily concern today is the Back Pain  (low)  Initial Vital Signs:  Pulse/HCG Rate: 77  Temp: 97.7 F (36.5 C) Resp: 18 BP: 120/76 SpO2: 95 % (on 3lnc with exertion)  BMI: Estimated body mass index is 38.44 kg/m as calculated from the following:   Height as of this encounter: 5\' 5"  (1.651 m).   Weight as of this encounter: 231 lb (104.8 kg).  Risk Assessment: Allergies: Reviewed. He is allergic to baclofen, hydralazine, and gabapentin.  Allergy Precautions: None required Coagulopathies: Reviewed. None identified.  Blood-thinner therapy: None at this time Active Infection(s): Reviewed. None identified. Mr. Narine is afebrile  Site Confirmation: Mr. Prinsen was asked to confirm the procedure and laterality before marking the site Procedure checklist: Completed Consent: Before the procedure and under the influence of no sedative(s), amnesic(s), or anxiolytics, the patient was informed of the treatment options, risks and possible complications. To fulfill our ethical and legal obligations, as recommended by the American Medical Association's Code of Ethics, I have informed the patient of my clinical impression; the nature and purpose of the treatment or procedure; the risks, benefits, and possible complications of the intervention; the alternatives, including doing nothing; the risk(s) and benefit(s) of the alternative treatment(s) or procedure(s); and the risk(s) and benefit(s) of doing nothing.  Mr. Wicke was provided with information about the general risks and possible complications associated with most interventional procedures. These include, but are not limited to: failure to achieve desired goals, infection, bleeding, organ or nerve damage, allergic reactions, paralysis, and/or death.  In addition, he was informed of those risks and possible complications associated to this particular procedure, which include, but are not limited to: damage to the implant; failure to decrease pain; local, systemic, or serious CNS infections,  intraspinal abscess with possible cord compression and paralysis, or life-threatening such as meningitis; bleeding; organ damage; nerve injury or damage with subsequent sensory, motor, and/or autonomic system dysfunction, resulting in transient or permanent pain, numbness, and/or weakness of one or several areas of the body; allergic reactions, either minor or major life-threatening, such as anaphylactic or anaphylactoid reactions.  Furthermore, Mr. Skora was informed of those risks and complications associated with the medications. These include, but are not limited to: allergic reactions (i.e.: anaphylactic or anaphylactoid reactions); endorphine suppression; bradycardia and/or hypotension; water retention and/or peripheral vascular relaxation leading to lower extremity edema and possible stasis ulcers; respiratory depression and/or shortness of breath; decreased metabolic rate leading to weight gain; swelling or edema; medication-induced neural toxicity; particulate matter embolism and blood vessel occlusion with resultant organ, and/or nervous system infarction; and/or intrathecal granuloma formation with possible spinal cord compression and permanent paralysis.  Before refilling the pump Mr. France was informed that some of the medications used in the devise may not be FDA approved for such use and therefore it constitutes an off-label use of the medications.  Finally, he was informed that Medicine is not an exact science; therefore, there is also the possibility of unforeseen or unpredictable risks and/or possible complications that may result in a catastrophic outcome.  The patient indicated having understood very clearly. We have given the patient no guarantees and we have made no promises. Enough time was given to the patient to ask questions, all of which were answered to the patient's satisfaction. Mr. Steinwand has indicated that he wanted to continue with the procedure. Attestation: I, the ordering  provider, attest that I have discussed with the patient the benefits, risks, side-effects, alternatives, likelihood of achieving goals, and potential problems during recovery for the procedure that I have provided informed consent. Date  Time: 07/31/2022 12:49 PM  Pre-Procedure Preparation:  Monitoring: As per clinic protocol. Respiration, ETCO2, SpO2, BP, heart rate and rhythm monitor placed and checked for adequate function Safety Precautions: Patient was assessed for positional comfort and pressure points before starting the procedure. Time-out: I initiated and conducted the "Time-out" before starting the procedure, as per protocol. The patient was asked to participate by confirming the accuracy of the "Time Out" information. Verification of the correct person, site, and procedure were performed and confirmed by me, the nursing staff, and the patient. "Time-out" conducted as per Joint Commission's Universal Protocol (UP.01.01.01). Time: 1335 Start Time: 1342 hrs.  Description of Procedure:          Position: Supine Target Area: Central-port of intrathecal pump. Approach: Anterior, 90 degree angle approach. Area Prepped: Entire Area around the pump implant. DuraPrep (Iodine Povacrylex [0.7% available iodine] and Isopropyl Alcohol, 74% w/w) Safety Precautions: Aspiration looking for blood return was conducted prior to all injections. At no point did we inject any substances, as a needle was being advanced. No attempts were made at seeking any paresthesias. Safe injection practices and needle disposal techniques used. Medications properly checked for expiration dates. SDV (single dose vial) medications used. Description of the Procedure: Protocol guidelines were followed. Two nurses trained to do implant refills were present during the entire procedure. The refill medication was checked by both healthcare providers as well as the patient. The patient was included in the "Time-out" to verify the  medication. The patient was placed in position. The pump was identified. The area was prepped in the usual manner. The sterile template was positioned over the pump, making sure the side-port location matched that of the pump. Both, the pump and the template were held for stability. The needle provided in the Medtronic Kit was then introduced thru the center of the template and into the central port. The pump content was aspirated and discarded volume documented. The new medication was slowly infused into the pump, thru the filter, making sure to avoid overpressure of the device. The needle was then removed and the area cleansed, making sure to leave some of the prepping solution back to take advantage of its long term bactericidal properties. The pump was interrogated and programmed to reflect the correct medication, volume, and dosage. The program was printed and taken to the physician for approval. Once checked and signed by the physician, a copy was provided to the patient and another scanned into the EMR.  Vitals:   07/31/22 1248  BP: 120/76  Pulse: 77  Resp: 18  Temp: 97.7 F (36.5 C)  TempSrc: Temporal  SpO2: 95%  Weight: 231 lb (104.8 kg)  Height: 5\' 5"  (1.651 m)    Start Time: 1342 hrs. End Time: 1348 hrs. Materials & Medications: Medtronic Refill Kit Medication(s): Please see chart orders for details.  Imaging Guidance:          Type of Imaging Technique: None used Indication(s): N/A Exposure Time: No  patient exposure Contrast: None used. Fluoroscopic Guidance: N/A Ultrasound Guidance: N/A Interpretation: N/A  Antibiotic Prophylaxis:   Anti-infectives (From admission, onward)    None      Indication(s): None identified  Post-operative Assessment:  Post-procedure Vital Signs:  Pulse/HCG Rate: 77  Temp: 97.7 F (36.5 C) Resp: 18 BP: 120/76 SpO2: 95 % (on 3lnc with exertion)  EBL: None  Complications: No immediate post-treatment complications observed by team,  or reported by patient.  Note: The patient tolerated the entire procedure well. A repeat set of vitals were taken after the procedure and the patient was kept under observation following institutional policy, for this type of procedure. Post-procedural neurological assessment was performed, showing return to baseline, prior to discharge. The patient was provided with post-procedure discharge instructions, including a section on how to identify potential problems. Should any problems arise concerning this procedure, the patient was given instructions to immediately contact us, at any time, without hesitation. In any case, we plan to contact the patient by telephone for a follow-up status report regarding this interventional procedure.  Comments:  No additional relevant information.  Plan of Care (POC)  Orders:  Orders Placed This Encounter  Procedures   PUMP REFILL    Maintain Protocol by having two(2) healthcare providers during procedure and programming.    Scheduling Instructions:     Please refill intrathecal pump today.    Order Specific Question:   Where will this procedure be performed?    Answer:   [facility]   PUMP REFILL    Whenever possible schedule on a procedure today.    Standing Status:   Future    Standing Expiration Date:   11/30/2022    Scheduling Instructions:     Please schedule intrathecal pump refill based on pump programming. Avoid schedule intervals of more than 120 days (4 months).    Order Specific Question:   Where will this procedure be performed?    Answer:   [facility]   Informed Consent Details: Physician/Practitioner Attestation; Transcribe to consent form and obtain patient signature    Transcribe to consent form and obtain patient signature.    Order Specific Question:   Physician/Practitioner attestation of informed consent for procedure/surgical case    Answer:   I, the physician/practitioner, attest that I have discussed with the  patient the benefits, risks, side effects, alternatives, likelihood of achieving goals and potential problems during recovery for the procedure that I have provided informed consent.    Order Specific Question:   Procedure    Answer:   Intrathecal pump refill    Order Specific Question:   Physician/Practitioner performing the procedure    Answer:   Attending Physician: Sydnee Levans. Laban Emperor, MD & designated trained staff    Order Specific Question:   Indication/Reason    Answer:   Chronic Pain Syndrome (G89.4), presence of an intrathecal pump (Z97.8)   Chronic Opioid Analgesic:  Intrathecal Hydromorphone (Dilaudid) @ 2.399 mg/d. MME: 9.6 mg/day.   Medications ordered for procedure: No orders of the defined types were placed in this encounter.  Medications administered: Larwence Duane had no medications administered during this visit.  See the medical record for exact dosing, route, and time of administration.  Follow-up plan:   Return for Pump Refill (Max:90mo).       Interventional Therapies  Risk Factors  Considerations:   NOTE: ELIQUIS ANTICOAGULATION (Stop: 3days  Re-start: 6 hrs) Hx of PE AAA  CHF  MO  GERD  OSA  Chronic Hepatitis C  COPD  Smoker  T2DM  HTN  GAD  (R) Lobectomy  Depression/Anxiety  PTSD  FBSS w/ Fusion Hardware  Unrealistic Expectations         Planned  Pending:   (10/17/2021) we plan to decrease the concentration of his intrathecal bupivacaine from 30 mg/mL to 20 mg/mL on his next refill.   Under consideration:   Continue management of intrathecal pump    Completed:   ITP Patient requested to come off of all opioids in pump (01/20/2019) Downward taper started. ITP Patient indicates interest in seeing how it would feel w/o pump. He is considering explant. (08/11/2019)  ITP replaced due to end-of-battery life (10/08/2020)  ITP Medication changed from Hydromorphone to Fentanyl (08/06/2021)  ITP Patient requested to increase opioids in pump after  experiencing withdrawals (08/12/2021)  ITP Medication changed from Fentanyl to Hydromorphone (04/09/2022)    Completed by Other Providers:   Lumbar Facet Blocks  Lumbar Facet RFA  LESIs  Lumbar Fusion  Pump Implant    Therapeutic  Palliative (PRN) options:   Continue management of intrathecal pump           Recent Visits No visits were found meeting these conditions. Showing recent visits within past 90 days and meeting all other requirements Today's Visits Date Type Provider Dept  07/31/22 Procedure visit Delano Metz, MD [facility]  Showing today's visits and meeting all other requirements Future Appointments No visits were found meeting these conditions. Showing future appointments within next 90 days and meeting all other requirements  Disposition: Discharge home  Discharge (Date  Time): 07/31/2022; 1400 hrs.   Primary Care Physician: [facility] Location[facility] Note by: Oswaldo Done, MD (TTS technology used. I apologize for any typographical errors that were not detected and corrected.) Date: 07/31/2022; Time: 1:57 PM  Disclaimer:  Medicine is not an Visual merchandiser. The only guarantee in medicine is that nothing is guaranteed. It is important to note that the decision to proceed with this intervention was based on the information collected from the patient. The Data and conclusions were drawn from the patient's questionnaire, the interview, and the physical examination. Because the information was provided in large part by the patient, it cannot be guaranteed that it has not been purposely or unconsciously manipulated. Every effort has been made to obtain as much relevant data as possible for this evaluation. It is important to note that the conclusions that lead to this procedure are derived in large part from the available data. Always take into account that the treatment will also be dependent on  availability of resources and existing treatment guidelines, considered by other Pain Management Practitioners as being common knowledge and practice, at the time of the intervention. For Medico-Legal purposes, it is also important to point out that variation in procedural techniques and pharmacological choices are the acceptable norm. The indications, contraindications, technique, and results of the above procedure should only be interpreted and judged by a Board-Certified Interventional Pain Specialist with extensive familiarity and expertise in the same exact procedure and technique.

## 2022-08-01 ENCOUNTER — Telehealth: Payer: Self-pay

## 2022-08-01 NOTE — Telephone Encounter (Signed)
Called PP. Denies any needs. Instructed to call if needs

## 2022-09-01 MED FILL — Medication: INTRATHECAL | Qty: 1 | Status: AC

## 2022-10-08 ENCOUNTER — Other Ambulatory Visit: Payer: Self-pay

## 2022-10-08 MED ORDER — PAIN MANAGEMENT IT PUMP REFILL: 1.0000 | Freq: Once | INTRATHECAL | 0 refills | Status: AC

## 2022-11-03 NOTE — Progress Notes (Unsigned)
PROVIDER NOTE: Interpretation of information contained herein should be left to medically-trained personnel. Specific patient instructions are provided elsewhere under "Patient Instructions" section of medical record. This document was created in part using STT-dictation technology, any transcriptional errors that may result from this process are unintentional.  Patient: Brian Spencer Type: Established DOB: [birth year] MRN: [id number] [facility]: [facility]  Service: Procedure DOS: 11/04/2022 Setting: Ambulatory Location: Ambulatory outpatient facility Delivery: Face-to-face Provider: Oswaldo Done, MD Specialty: Interventional Pain Management Specialty designation: 09 Location: Outpatient facility Ref. Prov.: [facility]       Interventional Therapy   Primary Reason for Visit: Interventional Pain Management Treatment. CC: No chief complaint on file.  Procedure:          Type: Management of Intrathecal Drug Delivery System (IDDS) - Reservoir Refill (40981). No rate change.  Indications: 1. Chronic pain syndrome   2. Chronic low back pain (1ry area of Pain) (Bilateral) (R>L)   3. Chronic lower extremity pain (2ry area of Pain) (Right)   4. Failed back surgical syndrome   5. DDD (degenerative disc disease), lumbosacral   6. Fusion of lumbar spine   7. Presence of intrathecal pump   8. Encounter for adjustment or management of infusion pump   9. Encounter for therapeutic procedure   10. Pharmacologic therapy   11. Encounter for medication management   12. Encounter for chronic pain management    Pain Assessment: Self-Reported Pain Score:  /10             Reported level is compatible with observation.         Intrathecal Drug Delivery System (IDDS)  Pump Device:  Manufacturer: Medtronic Model: Synchromed II Model No.: [id number] Serial No.: [id number] H Delivery Route: Intrathecal Type: Programmable  Volume (mL): 40 mL reservoir Priming Volume: 0.207 mL  Calibration  Constant: 117.0  MRI compatibility: Conditional   Implant Details:  Date: 10/08/20 (Third Pump) Implanter: Dr. Lucy Chris (Last)  Contact Information: [facility], [place], [place] Last Revision/Replacement: 10/08/2020 Estimated Replacement Date: May 2029  Implant Site: Abdominal Laterality: Right  Catheter: Manufacturer: Medtronic Model:  n/a  Model No.: 8731  Serial No.: n/a  Implanted Length (cm): 94.1  Catheter Volume (mL): 0.207  Tip Location (Level): T10 Canal Access Site: L2-3  Drug content:  Primary Medication Class: Opioid analgesic Medication: PF-Hydromorphone (Dilaudid) Concentration: 20 mg/mL   Secondary Medication Class: Local anesthetic Medication: PF-bupivacaine Concentration: 40 mg/mL  Tertiary Medication Class: Nonopioid analgesic Medication: PF-clonidine Concentration: 40 mcg/mL  Fourth Medication Class: none  Medication: n/a  Concentration: n/a  PTC/PCA parameters:  Mode: Off/Inactive  Programming:  Type: Simple continuous.  Medication, Concentration, Infusion Program, & Delivery Rate: For up-to-date details please see most recent scanned programming printout.        Changes:  Medication Change: None at this point Rate Change: No change in rate  Reported side-effects or adverse reactions: None reported  Effectiveness: Described as relatively effective, allowing for increase in activities of daily living (ADL) Clinically meaningful improvement in function (CMIF): Sustained CMIF goals met  Plan: Pump refill today   Pharmacotherapy Assessment   Opioid Analgesic: Intrathecal Hydromorphone (Dilaudid) @ 2.399 mg/d. MME: 9.6 mg/day.   Monitoring: [place] PMP: PDMP reviewed during this encounter.       Pharmacotherapy: No side-effects or adverse reactions reported. Compliance: No problems identified. Effectiveness: Clinically acceptable. Plan: Refer to "POC". UDS:  Summary  Date Value Ref Range Status  11/12/2015 FINAL   Final    Comment:    ====================================================================  [facility] COMP DRUG ANALYSIS,UR ==================================================================== Test                             Result       Flag       Units Drug Present and Declared for Prescription Verification   7-aminoclonazepam              82           EXPECTED   ng/mg creat    7-aminoclonazepam is an expected metabolite of clonazepam. Source    of clonazepam is a scheduled prescription medication.   Trazodone                      PRESENT      EXPECTED   1,3 chlorophenyl piperazine    PRESENT      EXPECTED    1,3-chlorophenyl piperazine is an expected metabolite of    trazodone.   Verapamil                      PRESENT      EXPECTED Drug Present not Declared for Prescription Verification   Hydromorphone                  1656         UNEXPECTED ng/mg creat    Hydromorphone may be administered as a scheduled prescription    medication; it is also an expected metabolite of hydrocodone. Drug Absent but Declared for Prescription Verification   Venlafaxine                    Not Detected UNEXPECTED ==================================================================== Test                      Result    Flag   Units      Ref Range   Creatinine              73               mg/dL      >=54 ==================================================================== Declared Medications:  The flagging and interpretation on this report are based on the  following declared medications.  Unexpected results may arise from  inaccuracies in the declared medications.  **Note: The testing scope of this panel includes these medications:  Clonazepam (Klonopin)  Trazodone (Desyrel)  Venlafaxine (Effexor)  Verapamil (Calan)  **Note: The testing scope of this panel does not include following  reported medications:  Albuterol  Budenoside (Symbicort)  Chlorthalidone  Formoterol (Symbicort)  Multivitamin   Tamsulosin (Flomax)  Tiotropium (Spiriva)  Ubiquinone (Coenzyme Q 10)  Vitamin D2 (Ergocalciferol) ==================================================================== For clinical consultation, please call [phone]. ====================================================================    No results found for: "CBDTHCR", "D8THCCBX", "D9THCCBX"   H&P (Pre-op Assessment):  Brian Spencer is a 72 y.o. (year old), male patient, seen today for interventional treatment. He  has a past surgical history that includes Laminectomy (10/07/2000); Lumbar fusion (01/07/2001); Insertion of Medtronic  Spinal Cord Stimulator (12/28/2001); REmoval of Spinal cord stimulator; bone spurs removal (Bilateral, 2004); Rotary cuff surgery (Right, 04/18/2003); arthroscopic knee surgery (Left, 12/12/2003); Joint replacement; left total knee replacement (Left, 06/18/2004); medtronic pain infusion pump implanted  (08/27/2006); Upper gi endoscopy (07/23/2007); bone spurs (Left, 09/07/2007); Colonoscopy (11/20/2007); chest pain  (01/29/2008); medtronic pump  stopped working (09/19/2008); Epidural block injection (02/20/2010); Implant 2, medtronic   bladder stimulators (06/11/2010); medtronic pain pump stimulator  (  12/02/2013); Lobectomy (Right, 12/23/2013); Joint replacement (Right, 2018); and Intrathecal pump revision (Right, 10/08/2020). Brian Spencer has a current medication list which includes the following prescription(s): acetaminophen, albuterol, albuterol, amlodipine, apixaban, atorvastatin, bupropion, clonazepam, neuriva plus, empagliflozin, magnesium oxide, metformin, multivitamin, naloxone, naloxone, NON FORMULARY, ondansetron, oxycodone-acetaminophen, oxygen-helium, pantoprazole, polyvinyl alcohol, potassium chloride sa, pregabalin, propranolol, tamsulosin, tiotropium bromide-olodaterol, torsemide, trazodone, and venlafaxine xr. His primarily concern today is the No chief complaint on file.  Initial Vital Signs:  Pulse/HCG  Rate:    Temp:   Resp:   BP:   SpO2:    BMI: Estimated body mass index is 38.44 kg/m as calculated from the following:   Height as of 07/31/22: 5\' 5"  (1.651 m).   Weight as of 07/31/22: 231 lb (104.8 kg).  Risk Assessment: Allergies: Reviewed. He is allergic to baclofen, hydralazine, and gabapentin.  Allergy Precautions: None required Coagulopathies: Reviewed. None identified.  Blood-thinner therapy: None at this time Active Infection(s): Reviewed. None identified. Brian Spencer is afebrile  Site Confirmation: Brian Spencer was asked to confirm the procedure and laterality before marking the site Procedure checklist: Completed Consent: Before the procedure and under the influence of no sedative(s), amnesic(s), or anxiolytics, the patient was informed of the treatment options, risks and possible complications. To fulfill our ethical and legal obligations, as recommended by the American Medical Association's Code of Ethics, I have informed the patient of my clinical impression; the nature and purpose of the treatment or procedure; the risks, benefits, and possible complications of the intervention; the alternatives, including doing nothing; the risk(s) and benefit(s) of the alternative treatment(s) or procedure(s); and the risk(s) and benefit(s) of doing nothing.  Brian Spencer was provided with information about the general risks and possible complications associated with most interventional procedures. These include, but are not limited to: failure to achieve desired goals, infection, bleeding, organ or nerve damage, allergic reactions, paralysis, and/or death.  In addition, he was informed of those risks and possible complications associated to this particular procedure, which include, but are not limited to: damage to the implant; failure to decrease pain; local, systemic, or serious CNS infections, intraspinal abscess with possible cord compression and paralysis, or life-threatening such as meningitis;  bleeding; organ damage; nerve injury or damage with subsequent sensory, motor, and/or autonomic system dysfunction, resulting in transient or permanent pain, numbness, and/or weakness of one or several areas of the body; allergic reactions, either minor or major life-threatening, such as anaphylactic or anaphylactoid reactions.  Furthermore, Mr. Cacciatore was informed of those risks and complications associated with the medications. These include, but are not limited to: allergic reactions (i.e.: anaphylactic or anaphylactoid reactions); endorphine suppression; bradycardia and/or hypotension; water retention and/or peripheral vascular relaxation leading to lower extremity edema and possible stasis ulcers; respiratory depression and/or shortness of breath; decreased metabolic rate leading to weight gain; swelling or edema; medication-induced neural toxicity; particulate matter embolism and blood vessel occlusion with resultant organ, and/or nervous system infarction; and/or intrathecal granuloma formation with possible spinal cord compression and permanent paralysis.  Before refilling the pump Brian Spencer was informed that some of the medications used in the devise may not be FDA approved for such use and therefore it constitutes an off-label use of the medications.  Finally, he was informed that Medicine is not an exact science; therefore, there is also the possibility of unforeseen or unpredictable risks and/or possible complications that may result in a catastrophic outcome. The patient indicated having understood very clearly. We have given the patient no guarantees and  we have made no promises. Enough time was given to the patient to ask questions, all of which were answered to the patient's satisfaction. Brian Spencer has indicated that he wanted to continue with the procedure. Attestation: I, the ordering provider, attest that I have discussed with the patient the benefits, risks, side-effects, alternatives,  likelihood of achieving goals, and potential problems during recovery for the procedure that I have provided informed consent. Date  Time: {CHL [facility]-PAIN TIME CHOICES:[id number]}  Pre-Procedure Preparation:  Monitoring: As per clinic protocol. Respiration, ETCO2, SpO2, BP, heart rate and rhythm monitor placed and checked for adequate function Safety Precautions: Patient was assessed for positional comfort and pressure points before starting the procedure. Time-out: I initiated and conducted the "Time-out" before starting the procedure, as per protocol. The patient was asked to participate by confirming the accuracy of the "Time Out" information. Verification of the correct person, site, and procedure were performed and confirmed by me, the nursing staff, and the patient. "Time-out" conducted as per Joint Commission's Universal Protocol (UP.01.01.01). Time:   Start Time:   hrs.  Description of Procedure:          Position: Supine Target Area: Central-port of intrathecal pump. Approach: Anterior, 90 degree angle approach. Area Prepped: Entire Area around the pump implant. DuraPrep (Iodine Povacrylex [0.7% available iodine] and Isopropyl Alcohol, 74% w/w) Safety Precautions: Aspiration looking for blood return was conducted prior to all injections. At no point did we inject any substances, as a needle was being advanced. No attempts were made at seeking any paresthesias. Safe injection practices and needle disposal techniques used. Medications properly checked for expiration dates. SDV (single dose vial) medications used. Description of the Procedure: Protocol guidelines were followed. Two nurses trained to do implant refills were present during the entire procedure. The refill medication was checked by both healthcare providers as well as the patient. The patient was included in the "Time-out" to verify the medication. The patient was placed in position. The pump was identified. The area was prepped in  the usual manner. The sterile template was positioned over the pump, making sure the side-port location matched that of the pump. Both, the pump and the template were held for stability. The needle provided in the Medtronic Kit was then introduced thru the center of the template and into the central port. The pump content was aspirated and discarded volume documented. The new medication was slowly infused into the pump, thru the filter, making sure to avoid overpressure of the device. The needle was then removed and the area cleansed, making sure to leave some of the prepping solution back to take advantage of its long term bactericidal properties. The pump was interrogated and programmed to reflect the correct medication, volume, and dosage. The program was printed and taken to the physician for approval. Once checked and signed by the physician, a copy was provided to the patient and another scanned into the EMR.  There were no vitals filed for this visit.  Start Time:   hrs. End Time:   hrs. Materials & Medications: Medtronic Refill Kit Medication(s): Please see chart orders for details.  Type of Imaging Technique: None used Indication(s): N/A Exposure Time: No patient exposure Contrast: None used. Fluoroscopic Guidance: N/A Ultrasound Guidance: N/A Interpretation: N/A  Antibiotic Prophylaxis:   Anti-infectives (From admission, onward)    None      Indication(s): None identified  Post-operative Assessment:  Post-procedure Vital Signs:  Pulse/HCG Rate:    Temp:   Resp:  BP:   SpO2:    EBL: None  Complications: No immediate post-treatment complications observed by team, or reported by patient.  Note: The patient tolerated the entire procedure well. A repeat set of vitals were taken after the procedure and the patient was kept under observation following institutional policy, for this type of procedure. Post-procedural neurological assessment was performed, showing return to  baseline, prior to discharge. The patient was provided with post-procedure discharge instructions, including a section on how to identify potential problems. Should any problems arise concerning this procedure, the patient was given instructions to immediately contact us, at any time, without hesitation. In any case, we plan to contact the patient by telephone for a follow-up status report regarding this interventional procedure.  Comments:  No additional relevant information.  Plan of Care (POC)  Orders:  No orders of the defined types were placed in this encounter.  Chronic Opioid Analgesic:  Intrathecal Hydromorphone (Dilaudid) @ 2.399 mg/d. MME: 9.6 mg/day.   Medications ordered for procedure: No orders of the defined types were placed in this encounter.  Medications administered: Brian Spencer had no medications administered during this visit.  See the medical record for exact dosing, route, and time of administration.  Follow-up plan:   No follow-ups on file.       Interventional Therapies  Risk Factors  Considerations:   NOTE: ELIQUIS ANTICOAGULATION (Stop: 3days  Re-start: 6 hrs) Hx of PE AAA  CHF  MO  GERD  OSA  Chronic Hepatitis C  COPD  Smoker  T2DM  HTN  GAD  (R) Lobectomy  Depression/Anxiety  PTSD  FBSS w/ Fusion Hardware  Unrealistic Expectations         Planned  Pending:   (10/17/2021) we plan to decrease the concentration of his intrathecal bupivacaine from 30 mg/mL to 20 mg/mL on his next refill.   Under consideration:   Continue management of intrathecal pump    Completed:   ITP Patient requested to come off of all opioids in pump (01/20/2019) Downward taper started. ITP Patient indicates interest in seeing how it would feel w/o pump. He is considering explant. (08/11/2019)  ITP replaced due to end-of-battery life (10/08/2020)  ITP Medication changed from Hydromorphone to Fentanyl (08/06/2021)  ITP Patient requested to increase opioids in pump  after experiencing withdrawals (08/12/2021)  ITP Medication changed from Fentanyl to Hydromorphone (04/09/2022)    Completed by Other Providers:   Lumbar Facet Blocks  Lumbar Facet RFA  LESIs  Lumbar Fusion  Pump Implant    Therapeutic  Palliative (PRN) options:   Continue management of intrathecal pump            Recent Visits No visits were found meeting these conditions. Showing recent visits within past 90 days and meeting all other requirements Future Appointments Date Type Provider Dept  11/04/22 Appointment Brian Metz, MD [facility]  Showing future appointments within next 90 days and meeting all other requirements  Disposition: Discharge home  Discharge (Date  Time): 11/04/2022;   hrs.   Primary Care Physician: [facility] Note by: Oswaldo Done, MD (TTS technology used. I apologize for any typographical errors that were not detected and corrected.) Date: 11/04/2022; Time: 6:47 AM  Disclaimer:  Medicine is not an Visual merchandiser. The only guarantee in medicine is that nothing is guaranteed. It is important to note that the decision to proceed with this intervention was based on the information collected from the patient. The  Data and conclusions were drawn from the patient's questionnaire, the interview, and the physical examination. Because the information was provided in large part by the patient, it cannot be guaranteed that it has not been purposely or unconsciously manipulated. Every effort has been made to obtain as much relevant data as possible for this evaluation. It is important to note that the conclusions that lead to this procedure are derived in large part from the available data. Always take into account that the treatment will also be dependent on availability of resources and existing treatment guidelines, considered by other Pain Management Practitioners as being common  knowledge and practice, at the time of the intervention. For Medico-Legal purposes, it is also important to point out that variation in procedural techniques and pharmacological choices are the acceptable norm. The indications, contraindications, technique, and results of the above procedure should only be interpreted and judged by a Board-Certified Interventional Pain Specialist with extensive familiarity and expertise in the same exact procedure and technique.

## 2022-11-04 ENCOUNTER — Encounter: Payer: Self-pay | Admitting: Pain Medicine

## 2022-11-04 ENCOUNTER — Ambulatory Visit: Payer: Worker's Compensation | Attending: Pain Medicine | Admitting: Pain Medicine

## 2022-11-04 VITALS — BP 116/32 | HR 74 | Temp 97.4°F | Resp 22 | Ht 65.0 in | Wt 224.0 lb

## 2022-11-04 DIAGNOSIS — G8929 Other chronic pain: Secondary | ICD-10-CM

## 2022-11-04 DIAGNOSIS — G894 Chronic pain syndrome: Secondary | ICD-10-CM | POA: Insufficient documentation

## 2022-11-04 DIAGNOSIS — M4326 Fusion of spine, lumbar region: Secondary | ICD-10-CM | POA: Diagnosis not present

## 2022-11-04 DIAGNOSIS — M961 Postlaminectomy syndrome, not elsewhere classified: Secondary | ICD-10-CM | POA: Insufficient documentation

## 2022-11-04 DIAGNOSIS — M5137 Other intervertebral disc degeneration, lumbosacral region: Secondary | ICD-10-CM | POA: Diagnosis not present

## 2022-11-04 DIAGNOSIS — M79604 Pain in right leg: Secondary | ICD-10-CM | POA: Diagnosis not present

## 2022-11-04 DIAGNOSIS — Z978 Presence of other specified devices: Secondary | ICD-10-CM | POA: Insufficient documentation

## 2022-11-04 DIAGNOSIS — Z5189 Encounter for other specified aftercare: Secondary | ICD-10-CM | POA: Diagnosis not present

## 2022-11-04 DIAGNOSIS — M5441 Lumbago with sciatica, right side: Secondary | ICD-10-CM | POA: Diagnosis present

## 2022-11-04 DIAGNOSIS — Z451 Encounter for adjustment and management of infusion pump: Secondary | ICD-10-CM | POA: Diagnosis not present

## 2022-11-04 DIAGNOSIS — Z79899 Other long term (current) drug therapy: Secondary | ICD-10-CM | POA: Insufficient documentation

## 2022-11-04 NOTE — Progress Notes (Signed)
Safety precautions to be maintained throughout the outpatient stay will include: orient to surroundings, keep bed in low position, maintain call bell within reach at all times, provide assistance with transfer out of bed and ambulation.

## 2022-11-04 NOTE — Patient Instructions (Signed)
Patient has Narcan at home   Opioid Overdose Opioids are drugs that are often used to treat pain. Opioids include illegal drugs, such as heroin, as well as prescription pain medicines, such as codeine, morphine, hydrocodone, and fentanyl. An opioid overdose happens when you take too much of an opioid. An overdose may be intentional or accidental and can happen with any type of opioid. The effects of an overdose can be mild, dangerous, or even deadly. Opioid overdose is a medical emergency. What are the causes? This condition may be caused by: Taking too much of an opioid on purpose. Taking too much of an opioid by accident. Using two or more substances that contain opioids at the same time. Taking an opioid with a substance that affects your heart, breathing, or blood pressure. These include alcohol, tranquilizers, sleeping pills, illegal drugs, and some over-the-counter medicines. This condition may also happen due to an error made by: A health care provider who prescribes a medicine. The pharmacist who fills the prescription. What increases the risk? This condition is more likely in: Children. They may be attracted to colorful pills. Because of a child's small size, even a small amount of a medicine can be dangerous. Older people. They may be taking many different medicines. Older people may have difficulty reading labels or remembering when they last took their medicines. They may also be more sensitive to the effects of opioids. People with chronic medical conditions, especially heart, liver, kidney, or neurological diseases. People who take an opioid for a long period of time. People who take opioids and use illegal drugs, such as heroin, or other substances, such as alcohol. People who: Have a history of drug or alcohol abuse. Have certain mental health conditions. Have a history of previous drug overdoses. People who take opioids that are not prescribed for them. What are the  signs or symptoms? Symptoms of this condition depend on the type of opioid and the amount that was taken. Common symptoms include: Sleepiness or difficulty waking from sleep. Confusion. Slurred speech. Slowed breathing and a slow pulse (bradycardia). Nausea and vomiting. Abnormally small pupils. Signs and symptoms that require emergency treatment include: Cold, clammy, and pale skin. Blue lips and fingernails. Vomiting. Gurgling sounds in the throat. A pulse that is very slow or difficult to detect. Breathing that is very irregular, slow, noisy, or difficult to detect. Inability to respond to speech or be awakened from sleep (stupor). Seizures. How is this diagnosed? This condition is diagnosed based on your symptoms and medical history. It is important to tell your health care provider: About all of the opioids that you took. When you took the opioids. Whether you were drinking alcohol or using marijuana, cocaine, or other drugs. Your health care provider will do a physical exam. This exam may include: Checking and monitoring your heart rate and rhythm, breathing rate, temperature, and blood pressure. Measuring oxygen levels in your blood. Checking for abnormally small pupils. You may also have blood tests or urine tests. You may have X-rays if you are having severe breathing problems. How is this treated? This condition requires immediate medical treatment and hospitalization. Reversing the effects of the opioid is the first step in treatment. If you have a Narcan kit or naloxone, use it right away. Follow your health care provider's instructions. A friend or family member can also help you with this. The rest of your treatment will be given in the hospital intensive care (ICU). Treatment in the hospital may include: Giving  salts and minerals (electrolytes) along with fluids through an IV. Inserting a breathing tube (endotracheal tube) in your airway to help you breathe if you cannot  breathe on your own or you are in danger of not being able to breathe on your own. Giving oxygen through a small tube under your nose. Passing a tube through your nose and into your stomach (nasogastric tube, or NG tube) to empty your stomach. Giving medicines that: Increase your blood pressure. Relieve nausea and vomiting. Relieve abdominal pain and cramping. Reverse the effects of the opioid (naloxone). Monitoring your heart and oxygen levels. Ongoing counseling and mental health support if you intentionally overdosed or used an illegal drug. Follow these instructions at home:  Medicines Take over-the-counter and prescription medicines only as told by your health care provider. Always ask your health care provider about possible side effects and interactions of any new medicine that you start taking. Keep a list of all the medicines that you take, including over-the-counter medicines. Bring this list with you to all your medical visits. General instructions Drink enough fluid to keep your urine pale yellow. Keep all follow-up visits. This is important. How is this prevented? Read the drug inserts that come with your opioid pain medicines. Take medicines only as told by your health care provider. Do not take more medicine than you are told. Do not take medicines more frequently than you are told. Do not drink alcohol or take sedatives when taking opioids. Do not use illegal or recreational drugs, including cocaine, ecstasy, and marijuana. Do not take opioid medicines that are not prescribed for you. Store all medicines in safety containers that are out of the reach of children. Get help if you are struggling with: Alcohol or drug use. Depression or another mental health problem. Thoughts of hurting yourself or another person. Keep the phone number of your local poison control center near your phone or in your mobile phone. In the [place] the hotline of the [facility]  is 4701118515. If you were prescribed naloxone, make sure you understand how to take it. Contact a health care provider if: You need help understanding how to take your pain medicines. You feel your medicines are too strong. You are concerned that your pain medicines are not working well for your pain. You develop new symptoms or side effects when you are taking medicines. Get help right away if: You or someone else is having symptoms of an opioid overdose. Get help even if you are not sure. You have thoughts about hurting yourself or others. You have: Chest pain. Difficulty breathing. A loss of consciousness. These symptoms may represent a serious problem that is an emergency. Do not wait to see if the symptoms will go away. Get medical help right away. Call your local emergency services (911 in the [place] Do not drive yourself to the hospital. If you ever feel like you may hurt yourself or others, or have thoughts about taking your own life, get help right away. You can go to your nearest emergency department or: Call your local emergency services (911 in the [place] Call a suicide crisis helpline, such as the National Suicide Prevention Lifeline at 352-269-3889 or 988 in the [place] This is open 24 hours a day in the [place] Text the Crisis Text Line at 551-806-6172 (in the [place] Summary Opioids are drugs that are often used to treat pain. Opioids include illegal drugs, such as heroin, as well as prescription pain medicines. An opioid overdose  happens when you take too much of an opioid. Overdoses can be intentional or accidental. Opioid overdose is very dangerous. It is a life-threatening emergency. If you or someone you know is experiencing an opioid overdose, get help right away. This information is not intended to replace advice given to you by your health care provider. Make sure you discuss any questions you have with your health care provider. Document Revised: 08/29/2020 Document  Reviewed: 05/16/2020 [employer] Patient Education  2024 [employer].

## 2022-11-05 ENCOUNTER — Telehealth: Payer: Self-pay | Admitting: *Deleted

## 2022-11-05 NOTE — Telephone Encounter (Signed)
No problems post IT pump fill. 

## 2022-11-17 MED FILL — Medication: INTRATHECAL | Qty: 1 | Status: AC

## 2023-01-09 ENCOUNTER — Other Ambulatory Visit: Payer: Self-pay

## 2023-01-09 MED ORDER — PAIN MANAGEMENT IT PUMP REFILL: 1.0000 | Freq: Once | INTRATHECAL | 0 refills | Status: AC

## 2023-02-03 ENCOUNTER — Ambulatory Visit: Payer: Worker's Compensation | Attending: Pain Medicine | Admitting: Pain Medicine

## 2023-02-03 VITALS — BP 111/89 | HR 80 | Temp 97.5°F | Resp 18 | Ht 65.0 in | Wt 220.0 lb

## 2023-02-03 DIAGNOSIS — M961 Postlaminectomy syndrome, not elsewhere classified: Secondary | ICD-10-CM | POA: Insufficient documentation

## 2023-02-03 DIAGNOSIS — Z451 Encounter for adjustment and management of infusion pump: Secondary | ICD-10-CM | POA: Diagnosis not present

## 2023-02-03 DIAGNOSIS — Z79899 Other long term (current) drug therapy: Secondary | ICD-10-CM | POA: Insufficient documentation

## 2023-02-03 DIAGNOSIS — Z978 Presence of other specified devices: Secondary | ICD-10-CM | POA: Diagnosis not present

## 2023-02-03 DIAGNOSIS — M5441 Lumbago with sciatica, right side: Secondary | ICD-10-CM | POA: Insufficient documentation

## 2023-02-03 DIAGNOSIS — M51372 Other intervertebral disc degeneration, lumbosacral region with discogenic back pain and lower extremity pain: Secondary | ICD-10-CM | POA: Insufficient documentation

## 2023-02-03 DIAGNOSIS — M79604 Pain in right leg: Secondary | ICD-10-CM | POA: Insufficient documentation

## 2023-02-03 DIAGNOSIS — G8929 Other chronic pain: Secondary | ICD-10-CM

## 2023-02-03 DIAGNOSIS — M4326 Fusion of spine, lumbar region: Secondary | ICD-10-CM | POA: Insufficient documentation

## 2023-02-03 DIAGNOSIS — G894 Chronic pain syndrome: Secondary | ICD-10-CM | POA: Diagnosis not present

## 2023-02-03 DIAGNOSIS — Z5189 Encounter for other specified aftercare: Secondary | ICD-10-CM | POA: Diagnosis not present

## 2023-02-03 NOTE — Progress Notes (Signed)
PROVIDER NOTE: Interpretation of information contained herein should be left to medically-trained personnel. Specific patient instructions are provided elsewhere under "Patient Instructions" section of medical record. This document was created in part using STT-dictation technology, any transcriptional errors that may result from this process are unintentional.  Patient: Brian Spencer Type: Established DOB: [birth year] MRN: [id number] [facility]: [facility]  Service: Procedure DOS: 02/03/2023 Setting: Ambulatory Location: Ambulatory outpatient facility Delivery: Face-to-face Provider: Oswaldo Done, MD Specialty: Interventional Pain Management Specialty designation: 09 Location: Outpatient facility Ref. Prov.: [facility]       Interventional Therapy   Primary Reason for Visit: Interventional Pain Management Treatment. CC: Back Pain (lower)  Procedure:          Type: Management of Intrathecal Drug Delivery System (IDDS) - Reservoir Refill (40981). No rate change.  Indications: 1. Chronic pain syndrome   2. Chronic low back pain (1ry area of Pain) (Bilateral) (R>L)   3. Chronic lower extremity pain (2ry area of Pain) (Right)   4. Failed back surgical syndrome   5. 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 Fusion of lumbar spine   7. Presence of intrathecal pump   8. Encounter for adjustment or management of infusion pump   9. Encounter for therapeutic procedure   10. Pharmacologic therapy   11. Encounter for medication management   12. Encounter for chronic pain management    Pain Assessment: Self-Reported Pain Score:  /10             Reported level is compatible with observation.          Intrathecal Drug Delivery System (IDDS)  Pump Device:  Manufacturer: Medtronic Model: Synchromed II Model No.: [id number] Serial No.: [id number] H Delivery Route: Intrathecal Type: Programmable  Volume (mL): 40  mL reservoir Priming Volume: 0.207 mL  Calibration Constant: 117.0  MRI compatibility: Conditional   Implant Details:  Date: 10/08/20 (Third Pump) Implanter: Dr. Lucy Chris (Last)  Contact Information: [facility], [place], [place] Last Revision/Replacement: 10/08/2020 Estimated Replacement Date: May 2029  Implant Site: Abdominal Laterality: Right  Catheter: Manufacturer: Medtronic Model:  n/a  Model No.: 8731  Serial No.: n/a  Implanted Length (cm): 94.1  Catheter Volume (mL): 0.207  Tip Location (Level): T10 Canal Access Site: L2-3  Drug content:  Primary Medication Class: Opioid analgesic Medication: PF-Hydromorphone (Dilaudid) Concentration: 20 mg/mL   Secondary Medication Class: Local anesthetic Medication: PF-bupivacaine Concentration: 40 mg/mL  Tertiary Medication Class: Nonopioid analgesic Medication: PF-clonidine Concentration: 40 mcg/mL  Fourth Medication Class: none  Medication: n/a  Concentration: n/a  PTC/PCA parameters:  Mode: Off/Inactive  Programming:  Type: Simple continuous.  Medication, Concentration, Infusion Program, & Delivery Rate: For up-to-date details please see most recent scanned programming printout.     Changes:  Medication Change: None at this point Rate Change: No change in rate  Reported side-effects or adverse reactions: None reported  Effectiveness: Described as relatively effective, allowing for increase in activities of daily living (ADL) Clinically meaningful improvement in function (CMIF): Sustained CMIF goals met  Plan: Pump refill today   Pharmacotherapy Assessment   Opioid Analgesic:  Intrathecal Hydromorphone (Dilaudid) @ 2.399 mg/d. MME: 9.6 mg/day.   Monitoring: [place] PMP: PDMP reviewed during this encounter.       Pharmacotherapy: No side-effects or adverse reactions reported. Compliance: No problems identified. Effectiveness: Clinically acceptable. Plan: Refer to "POC". UDS:  Summary   Date Value Ref Range Status  11/12/2015 FINAL  Final    Comment:    ==================================================================== [facility] COMP DRUG ANALYSIS,UR ==================================================================== Test                             Result       Flag       Units Drug Present and Declared for Prescription Verification   7-aminoclonazepam              82           EXPECTED   ng/mg creat    7-aminoclonazepam is an expected metabolite of clonazepam. Source    of clonazepam is a scheduled prescription medication.   Trazodone                      PRESENT      EXPECTED   1,3 chlorophenyl piperazine    PRESENT      EXPECTED    1,3-chlorophenyl piperazine is an expected metabolite of    trazodone.   Verapamil                      PRESENT      EXPECTED Drug Present not Declared for Prescription Verification   Hydromorphone                  1656         UNEXPECTED ng/mg creat    Hydromorphone may be administered as a scheduled prescription    medication; it is also an expected metabolite of hydrocodone. Drug Absent but Declared for Prescription Verification   Venlafaxine                    Not Detected UNEXPECTED ==================================================================== Test                      Result    Flag   Units      Ref Range   Creatinine              73               mg/dL      >=42 ==================================================================== Declared Medications:  The flagging and interpretation on this report are based on the  following declared medications.  Unexpected results may arise from  inaccuracies in the declared medications.  **Note: The testing scope of this panel includes these medications:  Clonazepam (Klonopin)  Trazodone (Desyrel)  Venlafaxine (Effexor)  Verapamil (Calan)  **Note: The testing scope of this panel does not include following  reported medications:  Albuterol  Budenoside (Symbicort)   Chlorthalidone  Formoterol (Symbicort)  Multivitamin  Tamsulosin (Flomax)  Tiotropium (Spiriva)  Ubiquinone (Coenzyme Q 10)  Vitamin D2 (Ergocalciferol) ==================================================================== For clinical consultation, please call [phone]. ====================================================================    No results found for: "CBDTHCR", "D8THCCBX", "D9THCCBX"   H&P (Pre-op Assessment):  Mr. Alamillo is a 72 y.o. (year old), male patient, seen today for interventional treatment. He  has a past surgical history that includes Laminectomy (10/07/2000); Lumbar fusion (01/07/2001); Insertion of Medtronic  Spinal Cord Stimulator (12/28/2001); REmoval of Spinal cord stimulator; bone spurs removal (Bilateral, 2004); Rotary cuff surgery (Right, 04/18/2003); arthroscopic knee surgery (Left, 12/12/2003); Joint replacement; left total knee replacement (Left, 06/18/2004); medtronic pain infusion pump implanted  (08/27/2006); Upper gi endoscopy (07/23/2007); bone spurs (Left, 09/07/2007); Colonoscopy (11/20/2007); chest pain  (01/29/2008); medtronic pump  stopped working (09/19/2008); Epidural block injection (02/20/2010); Implant 2, medtronic  bladder stimulators (06/11/2010); medtronic pain pump stimulator  (12/02/2013); Lobectomy (Right, 12/23/2013); Joint replacement (Right, 2018); and Intrathecal pump revision (Right, 10/08/2020). Mr. Bihl has a current medication list which includes the following prescription(s): acetaminophen, albuterol, albuterol, amlodipine, apixaban, atorvastatin, bupropion, clonazepam, neuriva plus, empagliflozin, magnesium oxide, metformin, multivitamin, naloxone, naloxone, NON FORMULARY, oxygen-helium, pantoprazole, polyvinyl alcohol, potassium chloride sa, propranolol, tamsulosin, tiotropium bromide-olodaterol, torsemide, trazodone, venlafaxine xr, ondansetron, oxycodone-acetaminophen, and pregabalin. His primarily concern today is the Back Pain  (lower)  Initial Vital Signs:  Pulse/HCG Rate: 80  Temp: (!) 97.5 F (36.4 C) Resp: 18 BP: 111/89 SpO2: (!) 86 %  BMI: Estimated body mass index is 36.61 kg/m as calculated from the following:   Height as of this encounter: 5\' 5"  (1.651 m).   Weight as of this encounter: 220 lb (99.8 kg).  Risk Assessment: Allergies: Reviewed. He is allergic to baclofen, hydralazine, and gabapentin.  Allergy Precautions: None required Coagulopathies: Reviewed. None identified.  Blood-thinner therapy: None at this time Active Infection(s): Reviewed. None identified. Mr. Bertrand is afebrile  Site Confirmation: Mr. Blystone was asked to confirm the procedure and laterality before marking the site Procedure checklist: Completed Consent: Before the procedure and under the influence of no sedative(s), amnesic(s), or anxiolytics, the patient was informed of the treatment options, risks and possible complications. To fulfill our ethical and legal obligations, as recommended by the American Medical Association's Code of Ethics, I have informed the patient of my clinical impression; the nature and purpose of the treatment or procedure; the risks, benefits, and possible complications of the intervention; the alternatives, including doing nothing; the risk(s) and benefit(s) of the alternative treatment(s) or procedure(s); and the risk(s) and benefit(s) of doing nothing.  Mr. Fleece was provided with information about the general risks and possible complications associated with most interventional procedures. These include, but are not limited to: failure to achieve desired goals, infection, bleeding, organ or nerve damage, allergic reactions, paralysis, and/or death.  In addition, he was informed of those risks and possible complications associated to this particular procedure, which include, but are not limited to: damage to the implant; failure to decrease pain; local, systemic, or serious CNS infections, intraspinal  abscess with possible cord compression and paralysis, or life-threatening such as meningitis; bleeding; organ damage; nerve injury or damage with subsequent sensory, motor, and/or autonomic system dysfunction, resulting in transient or permanent pain, numbness, and/or weakness of one or several areas of the body; allergic reactions, either minor or major life-threatening, such as anaphylactic or anaphylactoid reactions.  Furthermore, Mr. Puzio was informed of those risks and complications associated with the medications. These include, but are not limited to: allergic reactions (i.e.: anaphylactic or anaphylactoid reactions); endorphine suppression; bradycardia and/or hypotension; water retention and/or peripheral vascular relaxation leading to lower extremity edema and possible stasis ulcers; respiratory depression and/or shortness of breath; decreased metabolic rate leading to weight gain; swelling or edema; medication-induced neural toxicity; particulate matter embolism and blood vessel occlusion with resultant organ, and/or nervous system infarction; and/or intrathecal granuloma formation with possible spinal cord compression and permanent paralysis.  Before refilling the pump Mr. Durst was informed that some of the medications used in the devise may not be FDA approved for such use and therefore it constitutes an off-label use of the medications.  Finally, he was informed that Medicine is not an exact science; therefore, there is also the possibility of unforeseen or unpredictable risks and/or possible complications that may result in a catastrophic outcome. The patient indicated having understood very  clearly. We have given the patient no guarantees and we have made no promises. Enough time was given to the patient to ask questions, all of which were answered to the patient's satisfaction. Mr. Almonte has indicated that he wanted to continue with the procedure. Attestation: I, the ordering provider,  attest that I have discussed with the patient the benefits, risks, side-effects, alternatives, likelihood of achieving goals, and potential problems during recovery for the procedure that I have provided informed consent. Date  Time: 02/03/2023 12:42 PM  Pre-Procedure Preparation:  Monitoring: As per clinic protocol. Respiration, ETCO2, SpO2, BP, heart rate and rhythm monitor placed and checked for adequate function Safety Precautions: Patient was assessed for positional comfort and pressure points before starting the procedure. Time-out: I initiated and conducted the "Time-out" before starting the procedure, as per protocol. The patient was asked to participate by confirming the accuracy of the "Time Out" information. Verification of the correct person, site, and procedure were performed and confirmed by me, the nursing staff, and the patient. "Time-out" conducted as per Joint Commission's Universal Protocol (UP.01.01.01). Time: 1242 Start Time: 1246 hrs.  Description of Procedure:          Position: Supine Target Area: Central-port of intrathecal pump. Approach: Anterior, 90 degree angle approach. Area Prepped: Entire Area around the pump implant. ChloraPrep (2% chlorhexidine gluconate and 70% isopropyl alcohol) Safety Precautions: Aspiration looking for blood return was conducted prior to all injections. At no point did we inject any substances, as a needle was being advanced. No attempts were made at seeking any paresthesias. Safe injection practices and needle disposal techniques used. Medications properly checked for expiration dates. SDV (single dose vial) medications used. Description of the Procedure: Protocol guidelines were followed. Two nurses trained to do implant refills were present during the entire procedure. The refill medication was checked by both healthcare providers as well as the patient. The patient was included in the "Time-out" to verify the medication. The patient was  placed in position. The pump was identified. The area was prepped in the usual manner. The sterile template was positioned over the pump, making sure the side-port location matched that of the pump. Both, the pump and the template were held for stability. The needle provided in the Medtronic Kit was then introduced thru the center of the template and into the central port. The pump content was aspirated and discarded volume documented.  At this point, the nursing staff called me to check and assist with injecting the medication into the pump since they encounter more resistance than usual.  I went ahead and repositioned the needle axis into the center of the pump and was then able to inject the medication back into the pump without any problems.  The new medication was slowly infused into the pump, thru the filter, making sure to avoid overpressure of the device. The needle was then removed and the area cleansed, making sure to leave some of the prepping solution back to take advantage of its long term bactericidal properties. The pump was interrogated and programmed to reflect the correct medication, volume, and dosage. The program was printed and taken to the physician for approval. Once checked and signed by the physician, a copy was provided to the patient and another scanned into the EMR.  Vitals:   02/03/23 1241  BP: 111/89  Pulse: 80  Resp: 18  Temp: (!) 97.5 F (36.4 C)  TempSrc: Temporal  SpO2: (!) 86%  Weight: 220 lb (99.8 kg)  Height:  5\' 5"  (1.651 m)    Start Time: 1246 hrs. End Time:   hrs. Materials & Medications: Medtronic Refill Kit Medication(s): Please see chart orders for details.  Type of Imaging Technique: None used Indication(s): N/A Exposure Time: No patient exposure Contrast: None used. Fluoroscopic Guidance: N/A Ultrasound Guidance: N/A Interpretation: N/A  Antibiotic Prophylaxis:   Anti-infectives (From admission, onward)    None      Indication(s): None  identified  Post-operative Assessment:  Post-procedure Vital Signs:  Pulse/HCG Rate: 80  Temp: (!) 97.5 F (36.4 C) Resp: 18 BP: 111/89 SpO2: (!) 86 %  EBL: None  Complications: No immediate post-treatment complications observed by team, or reported by patient.  Note: The patient tolerated the entire procedure well. A repeat set of vitals were taken after the procedure and the patient was kept under observation following institutional policy, for this type of procedure. Post-procedural neurological assessment was performed, showing return to baseline, prior to discharge. The patient was provided with post-procedure discharge instructions, including a section on how to identify potential problems. Should any problems arise concerning this procedure, the patient was given instructions to immediately contact us, at any time, without hesitation. In any case, we plan to contact the patient by telephone for a follow-up status report regarding this interventional procedure.  Comments:  No additional relevant information.  Plan of Care (POC)  Orders:  Orders Placed This Encounter  Procedures   PUMP REFILL    Maintain Protocol by having two(2) healthcare providers during procedure and programming.    Scheduling Instructions:     Please refill intrathecal pump today.    Where will this procedure be performed?:   [facility]   PUMP REFILL    Whenever possible schedule on a procedure today.    Standing Status:   Future    Expiration Date:   06/04/2023    Scheduling Instructions:     Please schedule intrathecal pump refill based on pump programming. Avoid schedule intervals of more than 120 days (4 months).    Where will this procedure be performed?:   [facility]   Informed Consent Details: Physician/Practitioner Attestation; Transcribe to consent form and obtain patient signature    Transcribe to consent form and obtain patient signature.    Physician/Practitioner  attestation of informed consent for procedure/surgical case:   I, the physician/practitioner, attest that I have discussed with the patient the benefits, risks, side effects, alternatives, likelihood of achieving goals and potential problems during recovery for the procedure that I have provided informed consent.    Procedure:   Intrathecal pump refill    Physician/Practitioner performing the procedure:   Attending Physician: Corina Stacy A. Laban Emperor, MD & designated trained staff    Indication/Reason:   Chronic Pain Syndrome (G89.4), presence of an intrathecal pump (Z97.8)   Chronic Opioid Analgesic:   Intrathecal Hydromorphone (Dilaudid) @ 2.399 mg/d. MME: 9.6 mg/day.   Medications ordered for procedure: No orders of the defined types were placed in this encounter.  Medications administered: Dennison Labombard had no medications administered during this visit.  See the medical record for exact dosing, route, and time of administration.  Follow-up plan:   Return for Pump Refill (Max:38mo).       Interventional Therapies  Risk Factors  Considerations:   NOTE: ELIQUIS ANTICOAGULATION (Stop: 3days  Re-start: 6 hrs) Hx of PE AAA  CHF  MO  GERD  OSA  Chronic Hepatitis C  COPD  Smoker  T2DM  HTN  GAD  (R)  Lobectomy  Depression/Anxiety  PTSD  FBSS w/ Fusion Hardware  Unrealistic Expectations         Planned  Pending:   (10/17/2021) we plan to decrease the concentration of his intrathecal bupivacaine from 30 mg/mL to 20 mg/mL on his next refill.   Under consideration:   Continue management of intrathecal pump    Completed:   ITP Patient requested to come off of all opioids in pump (01/20/2019) Downward taper started. ITP Patient indicates interest in seeing how it would feel w/o pump. He is considering explant. (08/11/2019)  ITP replaced due to end-of-battery life (10/08/2020)  ITP Medication changed from Hydromorphone to Fentanyl (08/06/2021)  ITP Patient requested to increase  opioids in pump after experiencing withdrawals (08/12/2021)  ITP Medication changed from Fentanyl to Hydromorphone (04/09/2022)    Completed by Other Providers:   Lumbar Facet Blocks  Lumbar Facet RFA  LESIs  Lumbar Fusion  Pump Implant    Therapeutic  Palliative (PRN) options:   Continue management of intrathecal pump      Recent Visits No visits were found meeting these conditions. Showing recent visits within past 90 days and meeting all other requirements Today's Visits Date Type Provider Dept  02/03/23 Procedure visit Delano Metz, MD [facility]  Showing today's visits and meeting all other requirements Future Appointments Date Type Provider Dept  04/28/23 Appointment Delano Metz, MD [facility]  Showing future appointments within next 90 days and meeting all other requirements  Disposition: Discharge home  Discharge (Date  Time): 02/03/2023; 1313 hrs.   Primary Care Physician: [facility] Location[facility] Note by: Oswaldo Done, MD (TTS technology used. I apologize for any typographical errors that were not detected and corrected.) Date: 02/03/2023; Time: 1:54 PM  Disclaimer:  Medicine is not an Visual merchandiser. The only guarantee in medicine is that nothing is guaranteed. It is important to note that the decision to proceed with this intervention was based on the information collected from the patient. The Data and conclusions were drawn from the patient's questionnaire, the interview, and the physical examination. Because the information was provided in large part by the patient, it cannot be guaranteed that it has not been purposely or unconsciously manipulated. Every effort has been made to obtain as much relevant data as possible for this evaluation. It is important to note that the conclusions that lead to this procedure are derived in large part from the available data. Always take into  account that the treatment will also be dependent on availability of resources and existing treatment guidelines, considered by other Pain Management Practitioners as being common knowledge and practice, at the time of the intervention. For Medico-Legal purposes, it is also important to point out that variation in procedural techniques and pharmacological choices are the acceptable norm. The indications, contraindications, technique, and results of the above procedure should only be interpreted and judged by a Board-Certified Interventional Pain Specialist with extensive familiarity and expertise in the same exact procedure and technique.

## 2023-02-03 NOTE — Patient Instructions (Addendum)
Narcan at home    Opioid Overdose Opioids are drugs that are often used to treat pain. Opioids include illegal drugs, such as heroin, as well as prescription pain medicines, such as codeine, morphine, hydrocodone, and fentanyl. An opioid overdose happens when you take too much of an opioid. An overdose may be intentional or accidental and can happen with any type of opioid. The effects of an overdose can be mild, dangerous, or even deadly. Opioid overdose is a medical emergency. What are the causes? This condition may be caused by: Taking too much of an opioid on purpose. Taking too much of an opioid by accident. Using two or more substances that contain opioids at the same time. Taking an opioid with a substance that affects your heart, breathing, or blood pressure. These include alcohol, tranquilizers, sleeping pills, illegal drugs, and some over-the-counter medicines. This condition may also happen due to an error made by: A health care provider who prescribes a medicine. The pharmacist who fills the prescription. What increases the risk? This condition is more likely in: Children. They may be attracted to colorful pills. Because of a child's small size, even a small amount of a medicine can be dangerous. Older people. They may be taking many different medicines. Older people may have difficulty reading labels or remembering when they last took their medicines. They may also be more sensitive to the effects of opioids. People with chronic medical conditions, especially heart, liver, kidney, or neurological diseases. People who take an opioid for a long period of time. People who take opioids and use illegal drugs, such as heroin, or other substances, such as alcohol. People who: Have a history of drug or alcohol abuse. Have certain mental health conditions. Have a history of previous drug overdoses. People who take opioids that are not prescribed for them. What are the signs or  symptoms? Symptoms of this condition depend on the type of opioid and the amount that was taken. Common symptoms include: Sleepiness or difficulty waking from sleep. Confusion. Slurred speech. Slowed breathing and a slow pulse (bradycardia). Nausea and vomiting. Abnormally small pupils. Signs and symptoms that require emergency treatment include: Cold, clammy, and pale skin. Blue lips and fingernails. Vomiting. Gurgling sounds in the throat. A pulse that is very slow or difficult to detect. Breathing that is very irregular, slow, noisy, or difficult to detect. Inability to respond to speech or be awakened from sleep (stupor). Seizures. How is this diagnosed? This condition is diagnosed based on your symptoms and medical history. It is important to tell your health care provider: About all of the opioids that you took. When you took the opioids. Whether you were drinking alcohol or using marijuana, cocaine, or other drugs. Your health care provider will do a physical exam. This exam may include: Checking and monitoring your heart rate and rhythm, breathing rate, temperature, and blood pressure. Measuring oxygen levels in your blood. Checking for abnormally small pupils. You may also have blood tests or urine tests. You may have X-rays if you are having severe breathing problems. How is this treated? This condition requires immediate medical treatment and hospitalization. Reversing the effects of the opioid is the first step in treatment. If you have a Narcan kit or naloxone, use it right away. Follow your health care provider's instructions. A friend or family member can also help you with this. The rest of your treatment will be given in the hospital intensive care (ICU). Treatment in the hospital may include: Giving salts and  minerals (electrolytes) along with fluids through an IV. Inserting a breathing tube (endotracheal tube) in your airway to help you breathe if you cannot breathe  on your own or you are in danger of not being able to breathe on your own. Giving oxygen through a small tube under your nose. Passing a tube through your nose and into your stomach (nasogastric tube, or NG tube) to empty your stomach. Giving medicines that: Increase your blood pressure. Relieve nausea and vomiting. Relieve abdominal pain and cramping. Reverse the effects of the opioid (naloxone). Monitoring your heart and oxygen levels. Ongoing counseling and mental health support if you intentionally overdosed or used an illegal drug. Follow these instructions at home:  Medicines Take over-the-counter and prescription medicines only as told by your health care provider. Always ask your health care provider about possible side effects and interactions of any new medicine that you start taking. Keep a list of all the medicines that you take, including over-the-counter medicines. Bring this list with you to all your medical visits. General instructions Drink enough fluid to keep your urine pale yellow. Keep all follow-up visits. This is important. How is this prevented? Read the drug inserts that come with your opioid pain medicines. Take medicines only as told by your health care provider. Do not take more medicine than you are told. Do not take medicines more frequently than you are told. Do not drink alcohol or take sedatives when taking opioids. Do not use illegal or recreational drugs, including cocaine, ecstasy, and marijuana. Do not take opioid medicines that are not prescribed for you. Store all medicines in safety containers that are out of the reach of children. Get help if you are struggling with: Alcohol or drug use. Depression or another mental health problem. Thoughts of hurting yourself or another person. Keep the phone number of your local poison control center near your phone or in your mobile phone. In the [place] the hotline of the [facility] is 514-477-1126. If you were prescribed naloxone, make sure you understand how to take it. Contact a health care provider if: You need help understanding how to take your pain medicines. You feel your medicines are too strong. You are concerned that your pain medicines are not working well for your pain. You develop new symptoms or side effects when you are taking medicines. Get help right away if: You or someone else is having symptoms of an opioid overdose. Get help even if you are not sure. You have thoughts about hurting yourself or others. You have: Chest pain. Difficulty breathing. A loss of consciousness. These symptoms may represent a serious problem that is an emergency. Do not wait to see if the symptoms will go away. Get medical help right away. Call your local emergency services (911 in the [place] Do not drive yourself to the hospital. If you ever feel like you may hurt yourself or others, or have thoughts about taking your own life, get help right away. You can go to your nearest emergency department or: Call your local emergency services (911 in the [place] Call a suicide crisis helpline, such as the National Suicide Prevention Lifeline at 646-135-4464 or 988 in the [place] This is open 24 hours a day in the [place] Text the Crisis Text Line at 6201225837 (in the [place] Summary Opioids are drugs that are often used to treat pain. Opioids include illegal drugs, such as heroin, as well as prescription pain medicines. An opioid overdose happens when  you take too much of an opioid. Overdoses can be intentional or accidental. Opioid overdose is very dangerous. It is a life-threatening emergency. If you or someone you know is experiencing an opioid overdose, get help right away. This information is not intended to replace advice given to you by your health care provider. Make sure you discuss any questions you have with your health care provider. Document Revised: 08/29/2020 Document Reviewed:  05/16/2020 Elsevier Patient Education  2024 [employer]  ______________________________________________________________________    Opioid Pain Medication Update  To: All patients taking opioid pain medications. (I.e.: hydrocodone, hydromorphone, oxycodone, oxymorphone, morphine, codeine, methadone, tapentadol, tramadol, buprenorphine, fentanyl, etc.)  Re: Updated review of side effects and adverse reactions of opioid analgesics, as well as new information about long term effects of this class of medications.  Direct risks of long-term opioid therapy are not limited to opioid addiction and overdose. Potential medical risks include serious fractures, breathing problems during sleep, hyperalgesia, immunosuppression, chronic constipation, bowel obstruction, myocardial infarction, and tooth decay secondary to xerostomia.  Unpredictable adverse effects that can occur even if you take your medication correctly: Cognitive impairment, respiratory depression, and death. Most people think that if they take their medication "correctly", and "as instructed", that they will be safe. Nothing could be farther from the truth. In reality, a significant amount of recorded deaths associated with the use of opioids has occurred in individuals that had taken the medication for a long time, and were taking their medication correctly. The following are examples of how this can happen: Patient taking his/her medication for a long time, as instructed, without any side effects, is given a certain antibiotic or another unrelated medication, which in turn triggers a "Drug-to-drug interaction" leading to disorientation, cognitive impairment, impaired reflexes, respiratory depression or an untoward event leading to serious bodily harm or injury, including death.  Patient taking his/her medication for a long time, as instructed, without any side effects, develops an acute impairment of liver and/or kidney function. This will  lead to a rapid inability of the body to breakdown and eliminate their pain medication, which will result in effects similar to an "overdose", but with the same medicine and dose that they had always taken. This again may lead to disorientation, cognitive impairment, impaired reflexes, respiratory depression or an untoward event leading to serious bodily harm or injury, including death.  A similar problem will occur with patients as they grow older and their liver and kidney function begins to decrease as part of the aging process.  Background information: Historically, the original case for using long-term opioid therapy to treat chronic noncancer pain was based on safety assumptions that subsequent experience has called into question. In 1996, the American Pain Society and the American Academy of Pain Medicine issued a consensus statement supporting long-term opioid therapy. This statement acknowledged the dangers of opioid prescribing but concluded that the risk for addiction was low; respiratory depression induced by opioids was short-lived, occurred mainly in opioid-naive patients, and was antagonized by pain; tolerance was not a common problem; and efforts to control diversion should not constrain opioid prescribing. This has now proven to be wrong. Experience regarding the risks for opioid addiction, misuse, and overdose in community practice has failed to support these assumptions.  According to the Centers for Disease Control and Prevention, fatal overdoses involving opioid analgesics have increased sharply over the past decade. Currently, more than 96,700 people die from drug overdoses every year. Opioids are a factor in 7 out of every 10  overdose deaths. Deaths from drug overdose have surpassed motor vehicle accidents as the leading cause of death for individuals between the ages of 12 and 74.  Clinical data suggest that neuroendocrine dysfunction may be very common in both men and women,  potentially causing hypogonadism, erectile dysfunction, infertility, decreased libido, osteoporosis, and depression. Recent studies linked higher opioid dose to increased opioid-related mortality. Controlled observational studies reported that long-term opioid therapy may be associated with increased risk for cardiovascular events. Subsequent meta-analysis concluded that the safety of long-term opioid therapy in elderly patients has not been proven.   Side Effects and adverse reactions: Common side effects: Drowsiness (sedation). Dizziness. Nausea and vomiting. Constipation. Physical dependence -- Dependence often manifests with withdrawal symptoms when opioids are discontinued or decreased. Tolerance -- As you take repeated doses of opioids, you require increased medication to experience the same effect of pain relief. Respiratory depression -- This can occur in healthy people, especially with higher doses. However, people with COPD, asthma or other lung conditions may be even more susceptible to fatal respiratory impairment.  Uncommon side effects: An increased sensitivity to feeling pain and extreme response to pain (hyperalgesia). Chronic use of opioids can lead to this. Delayed gastric emptying (the process by which the contents of your stomach are moved into your small intestine). Muscle rigidity. Immune system and hormonal dysfunction. Quick, involuntary muscle jerks (myoclonus). Arrhythmia. Itchy skin (pruritus). Dry mouth (xerostomia).  Long-term side effects: Chronic constipation. Sleep-disordered breathing (SDB). Increased risk of bone fractures. Hypothalamic-pituitary-adrenal dysregulation. Increased risk of overdose.  RISKS: Respiratory depression and death: Opioids increase the risk of respiratory depression and death.  Drug-to-drug interactions: Opioids are relatively contraindicated in combination with benzodiazepines, sleep inducers, and other central nervous system  depressants. Other classes of medications (i.e.: certain antibiotics and even over-the-counter medications) may also trigger or induce respiratory depression in some patients.  Medical conditions: Patients with pre-existing respiratory problems are at higher risk of respiratory failure and/or depression when in combination with opioid analgesics. Opioids are relatively contraindicated in some medical conditions such as central sleep apnea.   Fractures and Falls:  Opioids increase the risk and incidence of falls. This is of particular importance in elderly patients.  Endocrine System:  Long-term administration is associated with endocrine abnormalities (endocrinopathies). (Also known as Opioid-induced Endocrinopathy) Influences on both the hypothalamic-pituitary-adrenal axis?and the hypothalamic-pituitary-gonadal axis have been demonstrated with consequent hypogonadism and adrenal insufficiency in both sexes. Hypogonadism and decreased levels of dehydroepiandrosterone sulfate have been reported in men and women. Endocrine effects include: Amenorrhoea in women (abnormal absence of menstruation) Reduced libido in both sexes Decreased sexual function Erectile dysfunction in men Hypogonadisms (decreased testicular function with shrinkage of testicles) Infertility Depression and fatigue Loss of muscle mass Anxiety Depression Immune suppression Hyperalgesia Weight gain Anemia Osteoporosis Patients (particularly women of childbearing age) should avoid opioids. There is insufficient evidence to recommend routine monitoring of asymptomatic patients taking opioids in the long-term for hormonal deficiencies.  Immune System: Human studies have demonstrated that opioids have an immunomodulating effect. These effects are mediated via opioid receptors both on immune effector cells and in the central nervous system. Opioids have been demonstrated to have adverse effects on antimicrobial response and  anti-tumour surveillance. Buprenorphine has been demonstrated to have no impact on immune function.  Opioid Induced Hyperalgesia: Human studies have demonstrated that prolonged use of opioids can lead to a state of abnormal pain sensitivity, sometimes called opioid induced hyperalgesia (OIH). Opioid induced hyperalgesia is not usually seen in the absence of  tolerance to opioid analgesia. Clinically, hyperalgesia may be diagnosed if the patient on long-term opioid therapy presents with increased pain. This might be qualitatively and anatomically distinct from pain related to disease progression or to breakthrough pain resulting from development of opioid tolerance. Pain associated with hyperalgesia tends to be more diffuse than the pre-existing pain and less defined in quality. Management of opioid induced hyperalgesia requires opioid dose reduction.  Cancer: Chronic opioid therapy has been associated with an increased risk of cancer among noncancer patients with chronic pain. This association was more evident in chronic strong opioid users. Chronic opioid consumption causes significant pathological changes in the small intestine and colon. Epidemiological studies have found that there is a link between opium dependence and initiation of gastrointestinal cancers. Cancer is the second leading cause of death after cardiovascular disease. Chronic use of opioids can cause multiple conditions such as GERD, immunosuppression and renal damage as well as carcinogenic effects, which are associated with the incidence of cancers.   Mortality: Long-term opioid use has been associated with increased mortality among patients with chronic non-cancer pain (CNCP).  Prescription of long-acting opioids for chronic noncancer pain was associated with a significantly increased risk of all-cause mortality, including deaths from causes other than overdose.  Reference: Von Korff M, Kolodny A, Deyo RA, Chou R. Long-term  opioid therapy reconsidered. Ann Intern Med. 2011 Sep 6;155(5):325-8. doi: 10.7326/[id number]. PMID: 56387564; PMCID: [id number]. Randon Goldsmith, Hayward RA, Dunn KM, [place] KP. Risk of adverse events in patients prescribed long-term opioids: A cohort study in the [place] [facility] [employer]. Eur J Pain. 2019 May;23(5):908-922. doi: 10.1002/ejp.1357. Epub 2019 Jan 31. PMID: 66063016. Colameco S, Coren JS, Ciervo CA. Continuous opioid treatment for chronic noncancer pain: a time for moderation in prescribing. Postgrad Med. 2009 Jul;121(4):61-6. doi: 10.3810/pgm.2009.07.2032. PMID: 01093235. William Hamburger RN, Goldfield SD, Blazina I, Cristopher [place], Bougatsos C, Deyo RA. The effectiveness and risks of long-term opioid therapy for chronic pain: a systematic review for a [employer] to [employer]. Ann Intern Med. 2015 Feb 17;162(4):276-86. doi: 10.7326/M14-2559. PMID: 57322025. Caryl Bis [facility], Makuc DM. NCHS Data Brief No. 22. [place]: Centers for Disease Control and Prevention; 2009. Sep, Increase in Fatal Poisonings Involving Opioid Analgesics in the [place], 1999-2006. Song IA, Choi HR, Oh TK. Long-term opioid use and mortality in patients with chronic non-cancer pain: Ten-year follow-up study in [place] from 2010 through 2019. EClinicalMedicine. 2022 Jul 18;51:101558. doi: [id number]/j.eclinm.[id number]. PMID: 37628315; PMCID: [id number]. Huser, W., Schubert, T., Vogelmann, T. et al. All-cause mortality in patients with long-term opioid therapy compared with non-opioid analgesics for chronic non-cancer pain: a database study. BMC Med 18, 162 (2020). http://lester.info/ Rashidian H, Karie Kirks, Malekzadeh R, Haghdoost AA. An Ecological Study of the Association between Opiate Use and Incidence of Cancers. Addict Health. 2016 Fall;8(4):252-260. PMID: 06269485; PMCID:  [id number].  Our Goal: Our goal is to control your pain with means other than the use of opioid pain medications.  Our Recommendation: Talk to your physician about coming off of these medications. We can assist you with the tapering down and stopping these medicines. Based on the new information, even if you cannot completely stop the medication, a decrease in the dose may be associated with a lesser risk. Ask for other means of controlling the pain. Decrease or eliminate those factors that significantly contribute to your pain such as smoking, obesity, and a diet heavily tilted towards "inflammatory" nutrients.  Last Updated: 08/25/2022   ______________________________________________________________________

## 2023-02-04 ENCOUNTER — Telehealth: Payer: Self-pay | Admitting: *Deleted

## 2023-02-04 NOTE — Telephone Encounter (Signed)
No problems post IT pump fill. 

## 2023-02-23 MED FILL — Medication: INTRATHECAL | Qty: 1 | Status: AC

## 2023-04-14 ENCOUNTER — Other Ambulatory Visit: Payer: Self-pay

## 2023-04-14 MED ORDER — PAIN MANAGEMENT IT PUMP REFILL: 1.0000 | Freq: Once | INTRATHECAL | 0 refills | Status: AC

## 2023-04-26 NOTE — Progress Notes (Unsigned)
 PROVIDER NOTE: Interpretation of information contained herein should be left to medically-trained personnel. Specific patient instructions are provided elsewhere under "Patient Instructions" section of medical record. This document was created in part using STT-dictation technology, any transcriptional errors that may result from this process are unintentional.  Patient: Brian Spencer Type: Established DOB: 1950-06-20 MRN: [id number] [facility]: [facility]  Service: Procedure DOS: 04/28/2023 Setting: Ambulatory Location: Ambulatory outpatient facility Delivery: Face-to-face Provider: Oswaldo Done, MD Specialty: Interventional Pain Management Specialty designation: 09 Location: Outpatient facility Ref. Prov.: [facility]       Interventional Therapy   Primary Reason for Visit: Interventional Pain Management Treatment. CC: No chief complaint on file.  Procedure:          Type: Management of Intrathecal Drug Delivery System (IDDS) - Reservoir Refill (40981). No rate change.  Indications: 1. Chronic pain syndrome   2. Chronic low back pain (1ry area of Pain) (Bilateral) (R>L)   3. Chronic lower extremity pain (2ry area of Pain) (Right)   4. Failed back surgical syndrome   5. 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 Fusion of lumbar spine   7. Presence of intrathecal pump   8. Encounter for adjustment or management of infusion pump   9. Encounter for therapeutic procedure   10. Pharmacologic therapy   11. Encounter for medication management    Pain Assessment: Self-Reported Pain Score:  /10             Reported level is compatible with observation.          Intrathecal Drug Delivery System (IDDS)  Pump Device:  Manufacturer: Medtronic Model: Synchromed II Model No.: [id number] Serial No.: [id number] H Delivery Route: Intrathecal Type: Programmable  Volume (mL): 40 mL reservoir Priming Volume: 0.207  mL  Calibration Constant: 117.0  MRI compatibility: Conditional   Implant Details:  Date: 10/08/20 (Third Pump) Implanter: Dr. Lucy Chris (Last)  Contact Information: [facility], [place], [place] Last Revision/Replacement: 10/08/2020 Estimated Replacement Date: May 2029  Implant Site: Abdominal Laterality: Right  Catheter: Manufacturer: Medtronic Model:  n/a  Model No.: 8731  Serial No.: n/a  Implanted Length (cm): 94.1  Catheter Volume (mL): 0.207  Tip Location (Level): T10 Canal Access Site: L2-3  Drug content:  Primary Medication Class: Opioid analgesic Medication: PF-Hydromorphone (Dilaudid) Concentration: 20 mg/mL   Secondary Medication Class: Local anesthetic Medication: PF-bupivacaine Concentration: 40 mg/mL  Tertiary Medication Class: Nonopioid analgesic Medication: PF-clonidine Concentration: 40 mcg/mL  Fourth Medication Class: none  Medication: n/a  Concentration: n/a  PTC/PCA parameters:  Mode: Off/Inactive  Programming:  Type: Simple continuous.  Medication, Concentration, Infusion Program, & Delivery Rate: For up-to-date details please see most recent scanned programming printout.     Changes:  Medication Change: None at this point Rate Change: No change in rate  Reported side-effects or adverse reactions: None reported  Effectiveness: Described as relatively effective, allowing for increase in activities of daily living (ADL) Clinically meaningful improvement in function (CMIF): Sustained CMIF goals met  Plan: Pump refill today   Pharmacotherapy Assessment   Opioid Analgesic:  Intrathecal Hydromorphone (Dilaudid) @ 2.399 mg/d. MME: 9.6 mg/day.   Monitoring: [place] PMP: PDMP reviewed during this encounter.       Pharmacotherapy: No side-effects or adverse reactions reported. Compliance: No problems identified. Effectiveness: Clinically acceptable. Plan: Refer to "POC". UDS:  Summary  Date Value Ref Range Status   11/12/2015 FINAL  Final    Comment:    ====================================================================  [facility] COMP DRUG ANALYSIS,UR ==================================================================== Test                             Result       Flag       Units Drug Present and Declared for Prescription Verification   7-aminoclonazepam              82           EXPECTED   ng/mg creat    7-aminoclonazepam is an expected metabolite of clonazepam. Source    of clonazepam is a scheduled prescription medication.   Trazodone                      PRESENT      EXPECTED   1,3 chlorophenyl piperazine    PRESENT      EXPECTED    1,3-chlorophenyl piperazine is an expected metabolite of    trazodone.   Verapamil                      PRESENT      EXPECTED Drug Present not Declared for Prescription Verification   Hydromorphone                  1656         UNEXPECTED ng/mg creat    Hydromorphone may be administered as a scheduled prescription    medication; it is also an expected metabolite of hydrocodone. Drug Absent but Declared for Prescription Verification   Venlafaxine                    Not Detected UNEXPECTED ==================================================================== Test                      Result    Flag   Units      Ref Range   Creatinine              73               mg/dL      >=81 ==================================================================== Declared Medications:  The flagging and interpretation on this report are based on the  following declared medications.  Unexpected results may arise from  inaccuracies in the declared medications.  **Note: The testing scope of this panel includes these medications:  Clonazepam (Klonopin)  Trazodone (Desyrel)  Venlafaxine (Effexor)  Verapamil (Calan)  **Note: The testing scope of this panel does not include following  reported medications:  Albuterol  Budenoside (Symbicort)  Chlorthalidone  Formoterol  (Symbicort)  Multivitamin  Tamsulosin (Flomax)  Tiotropium (Spiriva)  Ubiquinone (Coenzyme Q 10)  Vitamin D2 (Ergocalciferol) ==================================================================== For clinical consultation, please call [phone]. ====================================================================    No results found for: "CBDTHCR", "D8THCCBX", "D9THCCBX"   H&P (Pre-op Assessment):  Mr. Sult is a 73 y.o. (year old), male patient, seen today for interventional treatment. He  has a past surgical history that includes Laminectomy (10/07/2000); Lumbar fusion (01/07/2001); Insertion of Medtronic  Spinal Cord Stimulator (12/28/2001); REmoval of Spinal cord stimulator; bone spurs removal (Bilateral, 2004); Rotary cuff surgery (Right, 04/18/2003); arthroscopic knee surgery (Left, 12/12/2003); Joint replacement; left total knee replacement (Left, 06/18/2004); medtronic pain infusion pump implanted  (08/27/2006); Upper gi endoscopy (07/23/2007); bone spurs (Left, 09/07/2007); Colonoscopy (11/20/2007); chest pain  (01/29/2008); medtronic pump  stopped working (09/19/2008); Epidural block injection (02/20/2010); Implant 2, medtronic   bladder stimulators (06/11/2010); medtronic pain pump stimulator  (  12/02/2013); Lobectomy (Right, 12/23/2013); Joint replacement (Right, 2018); and Intrathecal pump revision (Right, 10/08/2020). Mr. Jablonowski has a current medication list which includes the following prescription(s): acetaminophen, albuterol, albuterol, amlodipine, apixaban, atorvastatin, bupropion, clonazepam, neuriva plus, empagliflozin, magnesium oxide, metformin, multivitamin, naloxone, NON FORMULARY, ondansetron, oxycodone-acetaminophen, oxygen-helium, pantoprazole, polyvinyl alcohol, potassium chloride sa, pregabalin, propranolol, tamsulosin, tiotropium bromide-olodaterol, torsemide, trazodone, and venlafaxine xr. His primarily concern today is the No chief complaint on file.  Initial Vital  Signs:  Pulse/HCG Rate:    Temp:   Resp:   BP:   SpO2:    BMI: Estimated body mass index is 36.61 kg/m as calculated from the following:   Height as of 02/03/23: 5\' 5"  (1.651 m).   Weight as of 02/03/23: 220 lb (99.8 kg).  Risk Assessment: Allergies: Reviewed. He is allergic to baclofen, hydralazine, and gabapentin.  Allergy Precautions: None required Coagulopathies: Reviewed. None identified.  Blood-thinner therapy: None at this time Active Infection(s): Reviewed. None identified. Mr. Brodowski is afebrile  Site Confirmation: Mr. Berkovich was asked to confirm the procedure and laterality before marking the site Procedure checklist: Completed Consent: Before the procedure and under the influence of no sedative(s), amnesic(s), or anxiolytics, the patient was informed of the treatment options, risks and possible complications. To fulfill our ethical and legal obligations, as recommended by the American Medical Association's Code of Ethics, I have informed the patient of my clinical impression; the nature and purpose of the treatment or procedure; the risks, benefits, and possible complications of the intervention; the alternatives, including doing nothing; the risk(s) and benefit(s) of the alternative treatment(s) or procedure(s); and the risk(s) and benefit(s) of doing nothing.  Mr. Mckimmy was provided with information about the general risks and possible complications associated with most interventional procedures. These include, but are not limited to: failure to achieve desired goals, infection, bleeding, organ or nerve damage, allergic reactions, paralysis, and/or death.  In addition, he was informed of those risks and possible complications associated to this particular procedure, which include, but are not limited to: damage to the implant; failure to decrease pain; local, systemic, or serious CNS infections, intraspinal abscess with possible cord compression and paralysis, or life-threatening  such as meningitis; bleeding; organ damage; nerve injury or damage with subsequent sensory, motor, and/or autonomic system dysfunction, resulting in transient or permanent pain, numbness, and/or weakness of one or several areas of the body; allergic reactions, either minor or major life-threatening, such as anaphylactic or anaphylactoid reactions.  Furthermore, Mr. Alcindor was informed of those risks and complications associated with the medications. These include, but are not limited to: allergic reactions (i.e.: anaphylactic or anaphylactoid reactions); endorphine suppression; bradycardia and/or hypotension; water retention and/or peripheral vascular relaxation leading to lower extremity edema and possible stasis ulcers; respiratory depression and/or shortness of breath; decreased metabolic rate leading to weight gain; swelling or edema; medication-induced neural toxicity; particulate matter embolism and blood vessel occlusion with resultant organ, and/or nervous system infarction; and/or intrathecal granuloma formation with possible spinal cord compression and permanent paralysis.  Before refilling the pump Mr. Machnik was informed that some of the medications used in the devise may not be FDA approved for such use and therefore it constitutes an off-label use of the medications.  Finally, he was informed that Medicine is not an exact science; therefore, there is also the possibility of unforeseen or unpredictable risks and/or possible complications that may result in a catastrophic outcome. The patient indicated having understood very clearly. We have given the patient no guarantees and we  have made no promises. Enough time was given to the patient to ask questions, all of which were answered to the patient's satisfaction. Mr. Gloster has indicated that he wanted to continue with the procedure. Attestation: I, the ordering provider, attest that I have discussed with the patient the benefits, risks,  side-effects, alternatives, likelihood of achieving goals, and potential problems during recovery for the procedure that I have provided informed consent. Date  Time: {CHL [facility]-PAIN TIME CHOICES:21018001}  Pre-Procedure Preparation:  Monitoring: As per clinic protocol. Respiration, ETCO2, SpO2, BP, heart rate and rhythm monitor placed and checked for adequate function Safety Precautions: Patient was assessed for positional comfort and pressure points before starting the procedure. Time-out: I initiated and conducted the "Time-out" before starting the procedure, as per protocol. The patient was asked to participate by confirming the accuracy of the "Time Out" information. Verification of the correct person, site, and procedure were performed and confirmed by me, the nursing staff, and the patient. "Time-out" conducted as per Joint Commission's Universal Protocol (UP.01.01.01). Time:   Start Time:   hrs.  Description of Procedure:          Position: Supine Target Area: Central-port of intrathecal pump. Approach: Anterior, 90 degree angle approach. Area Prepped: Entire Area around the pump implant. ChloraPrep (2% chlorhexidine gluconate and 70% isopropyl alcohol) Safety Precautions: Aspiration looking for blood return was conducted prior to all injections. At no point did we inject any substances, as a needle was being advanced. No attempts were made at seeking any paresthesias. Safe injection practices and needle disposal techniques used. Medications properly checked for expiration dates. SDV (single dose vial) medications used. Description of the Procedure: Protocol guidelines were followed. Two nurses trained to do implant refills were present during the entire procedure. The refill medication was checked by both healthcare providers as well as the patient. The patient was included in the "Time-out" to verify the medication. The patient was placed in position. The pump was identified. The area was  prepped in the usual manner. The sterile template was positioned over the pump, making sure the side-port location matched that of the pump. Both, the pump and the template were held for stability. The needle provided in the Medtronic Kit was then introduced thru the center of the template and into the central port. The pump content was aspirated and discarded volume documented. The new medication was slowly infused into the pump, thru the filter, making sure to avoid overpressure of the device. The needle was then removed and the area cleansed, making sure to leave some of the prepping solution back to take advantage of its long term bactericidal properties. The pump was interrogated and programmed to reflect the correct medication, volume, and dosage. The program was printed and taken to the physician for approval. Once checked and signed by the physician, a copy was provided to the patient and another scanned into the EMR.  There were no vitals filed for this visit.  Start Time:   hrs. End Time:   hrs. Materials & Medications: Medtronic Refill Kit Medication(s): Please see chart orders for details.  Type of Imaging Technique: None used Indication(s): N/A Exposure Time: No patient exposure Contrast: None used. Fluoroscopic Guidance: N/A Ultrasound Guidance: N/A Interpretation: N/A  Antibiotic Prophylaxis:   Anti-infectives (From admission, onward)    None      Indication(s): None identified  Post-operative Assessment:  Post-procedure Vital Signs:  Pulse/HCG Rate:    Temp:   Resp:   BP:   SpO2:  EBL: None  Complications: No immediate post-treatment complications observed by team, or reported by patient.  Note: The patient tolerated the entire procedure well. A repeat set of vitals were taken after the procedure and the patient was kept under observation following institutional policy, for this type of procedure. Post-procedural neurological assessment was performed, showing  return to baseline, prior to discharge. The patient was provided with post-procedure discharge instructions, including a section on how to identify potential problems. Should any problems arise concerning this procedure, the patient was given instructions to immediately contact us, at any time, without hesitation. In any case, we plan to contact the patient by telephone for a follow-up status report regarding this interventional procedure.  Comments:  No additional relevant information.  Plan of Care (POC)  Orders:  No orders of the defined types were placed in this encounter.  Chronic Opioid Analgesic:   Intrathecal Hydromorphone (Dilaudid) @ 2.399 mg/d. MME: 9.6 mg/day.   Medications ordered for procedure: No orders of the defined types were placed in this encounter.  Medications administered: Leeland Lovelady had no medications administered during this visit.  See the medical record for exact dosing, route, and time of administration.  Follow-up plan:   No follow-ups on file.       Interventional Therapies  Risk Factors  Considerations:   NOTE: ELIQUIS ANTICOAGULATION (Stop: 3days  Re-start: 6 hrs) Hx of PE AAA  CHF  MO  GERD  OSA  Chronic Hepatitis C  COPD  Smoker  T2DM  HTN  GAD  (R) Lobectomy  Depression/Anxiety  PTSD  FBSS w/ Fusion Hardware  Unrealistic Expectations         Planned  Pending:   (10/17/2021) we plan to decrease the concentration of his intrathecal bupivacaine from 30 mg/mL to 20 mg/mL on his next refill.   Under consideration:   Continue management of intrathecal pump    Completed:   ITP Patient requested to come off of all opioids in pump (01/20/2019) Downward taper started. ITP Patient indicates interest in seeing how it would feel w/o pump. He is considering explant. (08/11/2019)  ITP replaced due to end-of-battery life (10/08/2020)  ITP Medication changed from Hydromorphone to Fentanyl (08/06/2021)  ITP Patient requested to increase  opioids in pump after experiencing withdrawals (08/12/2021)  ITP Medication changed from Fentanyl to Hydromorphone (04/09/2022)    Completed by Other Providers:   Lumbar Facet Blocks  Lumbar Facet RFA  LESIs  Lumbar Fusion  Pump Implant    Therapeutic  Palliative (PRN) options:   Continue management of intrathecal pump       Recent Visits Date Type Provider Dept  02/03/23 Procedure visit Delano Metz, MD [facility]  Showing recent visits within past 90 days and meeting all other requirements Future Appointments Date Type Provider Dept  04/28/23 Appointment Delano Metz, MD [facility]  Showing future appointments within next 90 days and meeting all other requirements  Disposition: Discharge home  Discharge (Date  Time): 04/28/2023;   hrs.   Primary Care Physician: [facility] Location: [facility] Note by: Oswaldo Done, MD (TTS technology used. I apologize for any typographical errors that were not detected and corrected.) Date: 04/28/2023; Time: 7:41 AM  Disclaimer:  Medicine is not an Visual merchandiser. The only guarantee in medicine is that nothing is guaranteed. It is important to note that the decision to proceed with this intervention was based on the information collected from the patient. The Data and conclusions  were drawn from the patient's questionnaire, the interview, and the physical examination. Because the information was provided in large part by the patient, it cannot be guaranteed that it has not been purposely or unconsciously manipulated. Every effort has been made to obtain as much relevant data as possible for this evaluation. It is important to note that the conclusions that lead to this procedure are derived in large part from the available data. Always take into account that the treatment will also be dependent on availability of resources and existing treatment guidelines, considered by  other Pain Management Practitioners as being common knowledge and practice, at the time of the intervention. For Medico-Legal purposes, it is also important to point out that variation in procedural techniques and pharmacological choices are the acceptable norm. The indications, contraindications, technique, and results of the above procedure should only be interpreted and judged by a Board-Certified Interventional Pain Specialist with extensive familiarity and expertise in the same exact procedure and technique.

## 2023-04-28 ENCOUNTER — Ambulatory Visit: Payer: Worker's Compensation | Attending: Pain Medicine | Admitting: Pain Medicine

## 2023-04-28 ENCOUNTER — Encounter: Payer: Self-pay | Admitting: Pain Medicine

## 2023-04-28 VITALS — BP 124/71 | HR 74 | Temp 99.0°F | Resp 16 | Ht 64.0 in | Wt 225.0 lb

## 2023-04-28 DIAGNOSIS — G8929 Other chronic pain: Secondary | ICD-10-CM | POA: Diagnosis present

## 2023-04-28 DIAGNOSIS — Z978 Presence of other specified devices: Secondary | ICD-10-CM | POA: Insufficient documentation

## 2023-04-28 DIAGNOSIS — Z79899 Other long term (current) drug therapy: Secondary | ICD-10-CM | POA: Insufficient documentation

## 2023-04-28 DIAGNOSIS — G894 Chronic pain syndrome: Secondary | ICD-10-CM | POA: Diagnosis present

## 2023-04-28 DIAGNOSIS — M4326 Fusion of spine, lumbar region: Secondary | ICD-10-CM

## 2023-04-28 DIAGNOSIS — M961 Postlaminectomy syndrome, not elsewhere classified: Secondary | ICD-10-CM | POA: Diagnosis not present

## 2023-04-28 DIAGNOSIS — Z5189 Encounter for other specified aftercare: Secondary | ICD-10-CM | POA: Diagnosis not present

## 2023-04-28 DIAGNOSIS — M51372 Other intervertebral disc degeneration, lumbosacral region with discogenic back pain and lower extremity pain: Secondary | ICD-10-CM | POA: Insufficient documentation

## 2023-04-28 DIAGNOSIS — Z451 Encounter for adjustment and management of infusion pump: Secondary | ICD-10-CM | POA: Diagnosis not present

## 2023-04-28 DIAGNOSIS — M5441 Lumbago with sciatica, right side: Secondary | ICD-10-CM | POA: Diagnosis present

## 2023-04-28 NOTE — Patient Instructions (Signed)

## 2023-04-28 NOTE — Progress Notes (Signed)
 Safety precautions to be maintained throughout the outpatient stay will include: orient to surroundings, keep bed in low position, maintain call bell within reach at all times, provide assistance with transfer out of bed and ambulation.

## 2023-04-29 ENCOUNTER — Telehealth: Payer: Self-pay

## 2023-04-29 NOTE — Telephone Encounter (Signed)
Post pump refill follow up.  LM 

## 2023-05-13 MED FILL — Medication: INTRATHECAL | Qty: 1 | Status: AC

## 2023-07-07 ENCOUNTER — Telehealth: Payer: Self-pay

## 2023-07-07 ENCOUNTER — Other Ambulatory Visit: Payer: Self-pay

## 2023-07-07 MED ORDER — PAIN MANAGEMENT IT PUMP REFILL: 1.0000 | Freq: Once | INTRATHECAL | 0 refills | Status: AC

## 2023-07-07 NOTE — Telephone Encounter (Signed)
 I changed his pump refill from 08/04/23 to 07/30/23 due to DR. Barth Borne vacation

## 2023-07-30 ENCOUNTER — Ambulatory Visit: Payer: Worker's Compensation | Attending: Pain Medicine | Admitting: Pain Medicine

## 2023-07-30 ENCOUNTER — Encounter: Payer: Self-pay | Admitting: Pain Medicine

## 2023-07-30 VITALS — BP 147/102 | HR 72 | Temp 97.8°F | Resp 22 | Ht 65.0 in | Wt 230.0 lb

## 2023-07-30 DIAGNOSIS — Z5189 Encounter for other specified aftercare: Secondary | ICD-10-CM | POA: Diagnosis not present

## 2023-07-30 DIAGNOSIS — Z451 Encounter for adjustment and management of infusion pump: Secondary | ICD-10-CM | POA: Diagnosis not present

## 2023-07-30 DIAGNOSIS — Z79899 Other long term (current) drug therapy: Secondary | ICD-10-CM | POA: Diagnosis not present

## 2023-07-30 DIAGNOSIS — M4326 Fusion of spine, lumbar region: Secondary | ICD-10-CM | POA: Diagnosis not present

## 2023-07-30 DIAGNOSIS — M79604 Pain in right leg: Secondary | ICD-10-CM | POA: Diagnosis not present

## 2023-07-30 DIAGNOSIS — G8929 Other chronic pain: Secondary | ICD-10-CM | POA: Diagnosis present

## 2023-07-30 DIAGNOSIS — M51372 Other intervertebral disc degeneration, lumbosacral region with discogenic back pain and lower extremity pain: Secondary | ICD-10-CM | POA: Diagnosis not present

## 2023-07-30 DIAGNOSIS — M961 Postlaminectomy syndrome, not elsewhere classified: Secondary | ICD-10-CM | POA: Insufficient documentation

## 2023-07-30 DIAGNOSIS — Z978 Presence of other specified devices: Secondary | ICD-10-CM | POA: Diagnosis not present

## 2023-07-30 DIAGNOSIS — G894 Chronic pain syndrome: Secondary | ICD-10-CM | POA: Insufficient documentation

## 2023-07-30 DIAGNOSIS — M5441 Lumbago with sciatica, right side: Secondary | ICD-10-CM | POA: Diagnosis present

## 2023-07-30 NOTE — Patient Instructions (Signed)

## 2023-07-30 NOTE — Progress Notes (Signed)
 PROVIDER NOTE: Interpretation of information contained herein should be left to medically-trained personnel. Specific patient instructions are provided elsewhere under Patient Instructions section of medical record. This document was created in part using STT-dictation technology, any transcriptional errors that may result from this process are unintentional.  Patient: Brian Spencer Type: Established DOB: 10-Jun-1950 MRN: [id number] [facility]: [facility]  Service: Procedure DOS: 07/30/2023 Setting: Ambulatory Location: Ambulatory outpatient facility Delivery: Face-to-face Provider: Candi Chafe, MD Specialty: Interventional Pain Management Specialty designation: 09 Location: Outpatient facility Ref. Prov.: [facility]       Interventional Therapy   Primary Reason for Visit: Interventional Pain Management Treatment. CC: No chief complaint on file.  Procedure:          Type: Management of Intrathecal Drug Delivery System (IDDS) - Reservoir Refill (95621). 10% rate increase.  Indications: 1. Chronic pain syndrome   2. Chronic low back pain (1ry area of Pain) (Bilateral) (R>L)   3. Chronic lower extremity pain (2ry area of Pain) (Right)   4. Failed back surgical syndrome   5. 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 Fusion of lumbar spine   7. Presence of intrathecal pump   8. Encounter for adjustment or management of infusion pump   9. Encounter for therapeutic procedure   10. Pharmacologic therapy   11. Encounter for medication management   12. Encounter for chronic pain management    Pain Assessment: Self-Reported Pain Score: 6 /10             Reported level is compatible with observation.          Intrathecal Drug Delivery System (IDDS)  Pump Device:  Manufacturer: Medtronic Model: Synchromed II Model No.: [id number] Serial No.: [id number] Delivery Route: Intrathecal Type: Programmable   Volume (mL): 40 mL reservoir Priming Volume: 0.207 mL  Calibration Constant: 117.0  MRI compatibility: Conditional   Implant Details:  Date: 10/08/20 (Third Pump) Implanter: Dr. Berta Brittle (Last)  Contact Information: [facility], [place], [place] Last Revision/Replacement: 10/08/2020 Estimated Replacement Date: May 2029  Implant Site: Abdominal Laterality: Right  Catheter: Manufacturer: Medtronic Model:  n/a  Model No.: 8731  Serial No.: n/a  Implanted Length (cm): 94.1  Catheter Volume (mL): 0.207  Tip Location (Level): T10 Canal Access Site: L2-3  Drug content:  Primary Medication Class: Opioid analgesic Medication: PF-Hydromorphone (Dilaudid) Concentration: 20 mg/mL   Secondary Medication Class: Local anesthetic Medication: PF-bupivacaine  Concentration: 40 mg/mL  Tertiary Medication Class: Nonopioid analgesic Medication: PF-clonidine Concentration: 40 mcg/mL  Fourth Medication Class: none  Medication: n/a  Concentration: n/a  PTC/PCA parameters:  Mode: Off/Inactive  Programming:  Type: Simple continuous.  Medication, Concentration, Infusion Program, & Delivery Rate: For up-to-date details please see most recent scanned programming printout.     Changes:  Medication Change: None at this point Rate Change: 10% increase  Reported side-effects or adverse reactions: None reported  Effectiveness: Described as relatively effective, allowing for increase in activities of daily living (ADL) Clinically meaningful improvement in function (CMIF): Sustained CMIF goals met  Plan: Pump refill today   Pharmacotherapy Assessment  Opioid Analgesic: Intrathecal Hydromorphone (Dilaudid) @ 2.399 mg/d. MME: 9.6 mg/day.   Monitoring: [place] PMP: PDMP reviewed during this encounter.       Pharmacotherapy: No side-effects or adverse reactions reported. Compliance: No problems identified. Effectiveness: Clinically acceptable. Plan: Refer to POC.  UDS:   Summary  Date Value Ref Range Status  11/12/2015 FINAL  Final    Comment:    ==================================================================== [facility] COMP DRUG ANALYSIS,UR ==================================================================== Test                             Result       Flag       Units Drug Present and Declared for Prescription Verification   7-aminoclonazepam              82           EXPECTED   ng/mg creat    7-aminoclonazepam is an expected metabolite of clonazepam. Source    of clonazepam is a scheduled prescription medication.   Trazodone                      PRESENT      EXPECTED   1,3 chlorophenyl piperazine    PRESENT      EXPECTED    1,3-chlorophenyl piperazine is an expected metabolite of    trazodone.   Verapamil                      PRESENT      EXPECTED Drug Present not Declared for Prescription Verification   Hydromorphone                  1656         UNEXPECTED ng/mg creat    Hydromorphone may be administered as a scheduled prescription    medication; it is also an expected metabolite of hydrocodone. Drug Absent but Declared for Prescription Verification   Venlafaxine                    Not Detected UNEXPECTED ==================================================================== Test                      Result    Flag   Units      Ref Range   Creatinine              73               mg/dL      >=40 ==================================================================== Declared Medications:  The flagging and interpretation on this report are based on the  following declared medications.  Unexpected results may arise from  inaccuracies in the declared medications.  **Note: The testing scope of this panel includes these medications:  Clonazepam (Klonopin)  Trazodone (Desyrel)  Venlafaxine (Effexor)  Verapamil (Calan)  **Note: The testing scope of this panel does not include following  reported medications:  Albuterol  Budenoside (Symbicort)   Chlorthalidone  Formoterol (Symbicort)  Multivitamin  Tamsulosin (Flomax)  Tiotropium (Spiriva)  Ubiquinone (Coenzyme Q 10)  Vitamin D2 (Ergocalciferol ) ==================================================================== For clinical consultation, please call [phone]. ====================================================================    No results found for: CBDTHCR, D8THCCBX, D9THCCBX  H&P (Pre-op Assessment):  Mr. Baylock is a 73 y.o. (year old), male patient, seen today for interventional treatment. He  has a past surgical history that includes Laminectomy (10/07/2000); Lumbar fusion (01/07/2001); Insertion of Medtronic  Spinal Cord Stimulator (12/28/2001); REmoval of Spinal cord stimulator; bone spurs removal (Bilateral, 2004); Rotary cuff surgery (Right, 04/18/2003); arthroscopic knee surgery (Left, 12/12/2003); Joint replacement; left total knee replacement (Left, 06/18/2004); medtronic pain infusion pump implanted  (08/27/2006); Upper gi endoscopy (07/23/2007); bone spurs (Left, 09/07/2007); Colonoscopy (11/20/2007); chest pain  (01/29/2008); medtronic pump  stopped working (09/19/2008); Epidural block injection (02/20/2010); Implant 2, medtronic  bladder stimulators (06/11/2010); medtronic pain pump stimulator  (12/02/2013); Lobectomy (Right, 12/23/2013); Joint replacement (Right, 2018); and Intrathecal pump revision (Right, 10/08/2020). Mr. Manzo has a current medication list which includes the following prescription(s): acetaminophen , albuterol, albuterol, apixaban, atorvastatin, bupropion, clonazepam, neuriva plus, empagliflozin, losartan, magnesium oxide, metformin, multivitamin, naloxone , NON FORMULARY, ondansetron , oxygen-helium, pantoprazole, artificial tears, potassium chloride sa, propranolol, tamsulosin, tiotropium bromide-olodaterol, torsemide, trazodone, venlafaxine xr, amlodipine, oxycodone -acetaminophen , and pregabalin . His primarily concern today is the No chief  complaint on file.  Initial Vital Signs:  Pulse/HCG Rate: 72  Temp: 97.8 F (36.6 C) Resp: (!) 22 BP: (!) 147/102 SpO2: 91 %  BMI: Estimated body mass index is 38.27 kg/m as calculated from the following:   Height as of this encounter: 5' 5 (1.651 m).   Weight as of this encounter: 230 lb (104.3 kg).  Risk Assessment: Allergies: Reviewed. He is allergic to baclofen, hydralazine, and gabapentin .  Allergy Precautions: None required Coagulopathies: Reviewed. None identified.  Blood-thinner therapy: None at this time Active Infection(s): Reviewed. None identified. Mr. Marchant is afebrile  Site Confirmation: Mr. Husted was asked to confirm the procedure and laterality before marking the site Procedure checklist: Completed Consent: Before the procedure and under the influence of no sedative(s), amnesic(s), or anxiolytics, the patient was informed of the treatment options, risks and possible complications. To fulfill our ethical and legal obligations, as recommended by the American Medical Association's Code of Ethics, I have informed the patient of my clinical impression; the nature and purpose of the treatment or procedure; the risks, benefits, and possible complications of the intervention; the alternatives, including doing nothing; the risk(s) and benefit(s) of the alternative treatment(s) or procedure(s); and the risk(s) and benefit(s) of doing nothing.  Mr. Iles was provided with information about the general risks and possible complications associated with most interventional procedures. These include, but are not limited to: failure to achieve desired goals, infection, bleeding, organ or nerve damage, allergic reactions, paralysis, and/or death.  In addition, he was informed of those risks and possible complications associated to this particular procedure, which include, but are not limited to: damage to the implant; failure to decrease pain; local, systemic, or serious CNS infections,  intraspinal abscess with possible cord compression and paralysis, or life-threatening such as meningitis; bleeding; organ damage; nerve injury or damage with subsequent sensory, motor, and/or autonomic system dysfunction, resulting in transient or permanent pain, numbness, and/or weakness of one or several areas of the body; allergic reactions, either minor or major life-threatening, such as anaphylactic or anaphylactoid reactions.  Furthermore, Mr. Don was informed of those risks and complications associated with the medications. These include, but are not limited to: allergic reactions (i.e.: anaphylactic or anaphylactoid reactions); endorphine suppression; bradycardia and/or hypotension; water retention and/or peripheral vascular relaxation leading to lower extremity edema and possible stasis ulcers; respiratory depression and/or shortness of breath; decreased metabolic rate leading to weight gain; swelling or edema; medication-induced neural toxicity; particulate matter embolism and blood vessel occlusion with resultant organ, and/or nervous system infarction; and/or intrathecal granuloma formation with possible spinal cord compression and permanent paralysis.  Before refilling the pump Mr. Busbee was informed that some of the medications used in the devise may not be FDA approved for such use and therefore it constitutes an off-label use of the medications.  Finally, he was informed that Medicine is not an exact science; therefore, there is also the possibility of unforeseen or unpredictable risks and/or possible complications that may result in a catastrophic outcome. The patient indicated having  understood very clearly. We have given the patient no guarantees and we have made no promises. Enough time was given to the patient to ask questions, all of which were answered to the patient's satisfaction. Mr. Pouliot has indicated that he wanted to continue with the procedure. Attestation: I, the ordering  provider, attest that I have discussed with the patient the benefits, risks, side-effects, alternatives, likelihood of achieving goals, and potential problems during recovery for the procedure that I have provided informed consent. Date  Time: 07/30/2023 12:34 PM  Pre-Procedure Preparation:  Monitoring: As per clinic protocol. Respiration, ETCO2, SpO2, BP, heart rate and rhythm monitor placed and checked for adequate function Safety Precautions: Patient was assessed for positional comfort and pressure points before starting the procedure. Time-out: I initiated and conducted the Time-out before starting the procedure, as per protocol. The patient was asked to participate by confirming the accuracy of the Time Out information. Verification of the correct person, site, and procedure were performed and confirmed by me, the nursing staff, and the patient. Time-out conducted as per Joint Commission's Universal Protocol (UP.01.01.01). Time: 1230 Start Time: 1235 hrs.  Description of Procedure:          Position: Supine Target Area: Central-port of intrathecal pump. Approach: Anterior, 90 degree angle approach. Area Prepped: Entire Area around the pump implant. ChloraPrep (2% chlorhexidine  gluconate and 70% isopropyl alcohol) Safety Precautions: Aspiration looking for blood return was conducted prior to all injections. At no point did we inject any substances, as a needle was being advanced. No attempts were made at seeking any paresthesias. Safe injection practices and needle disposal techniques used. Medications properly checked for expiration dates. SDV (single dose vial) medications used. Description of the Procedure: Protocol guidelines were followed. Two nurses trained to do implant refills were present during the entire procedure. The refill medication was checked by both healthcare providers as well as the patient. The patient was included in the Time-out to verify the medication. The  patient was placed in position. The pump was identified. The area was prepped in the usual manner. The sterile template was positioned over the pump, making sure the side-port location matched that of the pump. Both, the pump and the template were held for stability. The needle provided in the Medtronic Kit was then introduced thru the center of the template and into the central port. The pump content was aspirated and discarded volume documented. The new medication was slowly infused into the pump, thru the filter, making sure to avoid overpressure of the device. The needle was then removed and the area cleansed, making sure to leave some of the prepping solution back to take advantage of its long term bactericidal properties. The pump was interrogated and programmed to reflect the correct medication, volume, and dosage. The program was printed and taken to the physician for approval. Once checked and signed by the physician, a copy was provided to the patient and another scanned into the EMR.  Vitals:   07/30/23 1232  BP: (!) 147/102  Pulse: 72  Resp: (!) 22  Temp: 97.8 F (36.6 C)  SpO2: 91%  Weight: 230 lb (104.3 kg)  Height: 5' 5 (1.651 m)    Start Time: 1235 hrs. End Time: 1309 hrs. Materials & Medications: Medtronic Refill Kit Medication(s): Please see chart orders for details.  Type of Imaging Technique: None used Indication(s): N/A Exposure Time: No patient exposure Contrast: None used. Fluoroscopic Guidance: N/A Ultrasound Guidance: N/A Interpretation: N/A  Antibiotic Prophylaxis:   Anti-infectives (  From admission, onward)    None      Indication(s): None identified  Post-operative Assessment:  Post-procedure Vital Signs:  Pulse/HCG Rate: 72  Temp: 97.8 F (36.6 C) Resp: (!) 22 BP: (!) 147/102 SpO2: 91 %  EBL: None  Complications: No immediate post-treatment complications observed by team, or reported by patient.  Note: The patient tolerated the entire  procedure well. A repeat set of vitals were taken after the procedure and the patient was kept under observation following institutional policy, for this type of procedure. Post-procedural neurological assessment was performed, showing return to baseline, prior to discharge. The patient was provided with post-procedure discharge instructions, including a section on how to identify potential problems. Should any problems arise concerning this procedure, the patient was given instructions to immediately contact us , at any time, without hesitation. In any case, we plan to contact the patient by telephone for a follow-up status report regarding this interventional procedure.  Comments:  No additional relevant information.  Plan of Care (POC)  Orders:  Orders Placed This Encounter  Procedures   PUMP REFILL    Maintain Protocol by having two(2) healthcare providers during procedure and programming.    Scheduling Instructions:     Please refill intrathecal pump today. (07/30/2023)    Where will this procedure be performed?:   [facility]   PUMP REFILL    Whenever possible schedule on a procedure today.    Standing Status:   Future    Expiration Date:   11/29/2023    Scheduling Instructions:     Please schedule intrathecal pump refill based on pump programming. Avoid schedule intervals of more than 120 days (4 months).    Where will this procedure be performed?:   [facility]   PUMP REPROGRAM    Follow programming protocol by having two(2) healthcare providers present during programming.    Scheduling Instructions:     Please perform the following adjustment: Increase rate by 10%.     Date: 07/30/2023    Where will this procedure be performed?:   [facility]   Informed Consent Details: Physician/Practitioner Attestation; Transcribe to consent form and obtain patient signature    Transcribe to consent form and obtain patient signature.    Physician/Practitioner attestation  of informed consent for procedure/surgical case:   I, the physician/practitioner, attest that I have discussed with the patient the benefits, risks, side effects, alternatives, likelihood of achieving goals and potential problems during recovery for the procedure that I have provided informed consent.    Procedure:   Intrathecal pump refill    Physician/Practitioner performing the procedure:   Attending Physician: Terryn Redner A. Barth Borne, MD & designated trained staff    Indication/Reason:   Chronic Pain Syndrome (G89.4), presence of an intrathecal pump (Z97.8)    Opioid Analgesic(s):  Intrathecal Hydromorphone (Dilaudid) @ 2.399 mg/d. MME: 9.6 mg/day.    Medications ordered for procedure: No orders of the defined types were placed in this encounter.  Medications administered: Antonius Hartlage had no medications administered during this visit.  See the medical record for exact dosing, route, and time of administration.    Interventional Therapies  Risk Factors  Considerations:   NOTE: ELIQUIS ANTICOAGULATION (Stop: 3days  Re-start: 6 hrs) Hx of PE AAA  CHF  MO  GERD  OSA  Chronic Hepatitis C  COPD  Smoker  T2DM  HTN  GAD  (R) Lobectomy  Depression/Anxiety  PTSD  FBSS w/ Fusion Hardware  Unrealistic Expectations  Planned  Pending:   (10/17/2021) we plan to decrease the concentration of his intrathecal bupivacaine  from 30 mg/mL to 20 mg/mL on his next refill.   Under consideration:   Continue management of intrathecal pump    Completed:   ITP Patient requested to come off of all opioids in pump (01/20/2019) Downward taper started. ITP Patient indicates interest in seeing how it would feel w/o pump. He is considering explant. (08/11/2019)  ITP replaced due to end-of-battery life (10/08/2020)  ITP Medication changed from Hydromorphone to Fentanyl  (08/06/2021)  ITP Patient requested to increase opioids in pump after experiencing withdrawals (08/12/2021)  ITP Medication  changed from Fentanyl  to Hydromorphone (04/09/2022)    Completed by Other Providers:   Lumbar Facet Blocks  Lumbar Facet RFA  LESIs  Lumbar Fusion  Pump Implant    Therapeutic  Palliative (PRN) options:   Continue management of intrathecal pump       Follow-up plan:   Return for Pump Refill (Max:82mo).     Recent Visits No visits were found meeting these conditions. Showing recent visits within past 90 days and meeting all other requirements Today's Visits Date Type Provider Dept  07/30/23 Procedure visit Renaldo Caroli, MD [facility]  Showing today's visits and meeting all other requirements Future Appointments No visits were found meeting these conditions. Showing future appointments within next 90 days and meeting all other requirements   Disposition: Discharge home  Discharge (Date  Time): 07/30/2023; 1311 hrs.   Primary [facility] Physician: [facility] Note by: Candi Chafe, MD (TTS technology used. I apologize for any typographical errors that were not detected and corrected.) Date: 07/30/2023; Time: 2:03 PM  Disclaimer:  Medicine is not an Visual merchandiser. The only guarantee in medicine is that nothing is guaranteed. It is important to note that the decision to proceed with this intervention was based on the information collected from the patient. The Data and conclusions were drawn from the patient's questionnaire, the interview, and the physical examination. Because the information was provided in large part by the patient, it cannot be guaranteed that it has not been purposely or unconsciously manipulated. Every effort has been made to obtain as much relevant data as possible for this evaluation. It is important to note that the conclusions that lead to this procedure are derived in large part from the available data. Always take into account that the treatment will also be dependent on  availability of resources and existing treatment guidelines, considered by other Pain Management Practitioners as being common knowledge and practice, at the time of the intervention. For Medico-Legal purposes, it is also important to point out that variation in procedural techniques and pharmacological choices are the acceptable norm. The indications, contraindications, technique, and results of the above procedure should only be interpreted and judged by a Board-Certified Interventional Pain Specialist with extensive familiarity and expertise in the same exact procedure and technique.

## 2023-07-30 NOTE — Progress Notes (Signed)
 Safety precautions to be maintained throughout the outpatient stay will include: orient to surroundings, keep bed in low position, maintain call bell within reach at all times, provide assistance with transfer out of bed and ambulation.

## 2023-07-31 ENCOUNTER — Telehealth: Payer: Self-pay

## 2023-07-31 NOTE — Telephone Encounter (Signed)
 No issues post intrathecal pain pump refill.

## 2023-08-04 ENCOUNTER — Encounter: Payer: Self-pay | Admitting: Pain Medicine

## 2023-08-10 MED FILL — Medication: INTRATHECAL | Qty: 1 | Status: AC

## 2023-11-11 ENCOUNTER — Other Ambulatory Visit: Payer: Self-pay

## 2023-11-11 MED ORDER — PAIN MANAGEMENT IT PUMP REFILL: 1.0000 | Freq: Once | INTRATHECAL | 0 refills | Status: AC

## 2023-11-17 ENCOUNTER — Ambulatory Visit: Payer: Worker's Compensation | Attending: Pain Medicine | Admitting: Pain Medicine

## 2023-11-17 ENCOUNTER — Encounter: Payer: Self-pay | Admitting: Pain Medicine

## 2023-11-17 VITALS — BP 165/84 | HR 59 | Temp 97.6°F | Resp 22 | Ht 65.0 in | Wt 230.0 lb

## 2023-11-17 DIAGNOSIS — M4326 Fusion of spine, lumbar region: Secondary | ICD-10-CM | POA: Diagnosis not present

## 2023-11-17 DIAGNOSIS — Z451 Encounter for adjustment and management of infusion pump: Secondary | ICD-10-CM | POA: Insufficient documentation

## 2023-11-17 DIAGNOSIS — G894 Chronic pain syndrome: Secondary | ICD-10-CM | POA: Insufficient documentation

## 2023-11-17 DIAGNOSIS — Z79899 Other long term (current) drug therapy: Secondary | ICD-10-CM | POA: Insufficient documentation

## 2023-11-17 DIAGNOSIS — M961 Postlaminectomy syndrome, not elsewhere classified: Secondary | ICD-10-CM | POA: Insufficient documentation

## 2023-11-17 DIAGNOSIS — M51372 Other intervertebral disc degeneration, lumbosacral region with discogenic back pain and lower extremity pain: Secondary | ICD-10-CM | POA: Insufficient documentation

## 2023-11-17 DIAGNOSIS — Z5189 Encounter for other specified aftercare: Secondary | ICD-10-CM | POA: Diagnosis not present

## 2023-11-17 DIAGNOSIS — M79604 Pain in right leg: Secondary | ICD-10-CM | POA: Diagnosis not present

## 2023-11-17 DIAGNOSIS — G8929 Other chronic pain: Secondary | ICD-10-CM

## 2023-11-17 DIAGNOSIS — Z978 Presence of other specified devices: Secondary | ICD-10-CM | POA: Diagnosis not present

## 2023-11-17 DIAGNOSIS — M5441 Lumbago with sciatica, right side: Secondary | ICD-10-CM | POA: Diagnosis present

## 2023-11-17 MED FILL — Medication: INTRATHECAL | Qty: 1 | Status: AC

## 2023-11-17 NOTE — Progress Notes (Signed)
 Safety precautions to be maintained throughout the outpatient stay will include: orient to surroundings, keep bed in low position, maintain call bell within reach at all times, provide assistance with transfer out of bed and ambulation.

## 2023-11-17 NOTE — Patient Instructions (Signed)
 Narcan  at home   Opioid Overdose Opioids are drugs that are often used to treat pain. Opioids include illegal drugs, such as heroin, as well as prescription pain medicines, such as codeine, morphine, hydrocodone , and fentanyl . An opioid overdose happens when you take too much of an opioid. An overdose may be intentional or accidental and can happen with any type of opioid. The effects of an overdose can be mild, dangerous, or even deadly. Opioid overdose is a medical emergency. What are the causes? This condition may be caused by: Taking too much of an opioid on purpose. Taking too much of an opioid by accident. Using two or more substances that contain opioids at the same time. Taking an opioid with a substance that affects your heart, breathing, or blood pressure. These include alcohol, tranquilizers, sleeping pills, illegal drugs, and some over-the-counter medicines. This condition may also happen due to an error made by: A health care provider who prescribes a medicine. The pharmacist who fills the prescription. What increases the risk? This condition is more likely in: Children. They may be attracted to colorful pills. Because of a child's small size, even a small amount of a medicine can be dangerous. Older people. They may be taking many different medicines. Older people may have difficulty reading labels or remembering when they last took their medicines. They may also be more sensitive to the effects of opioids. People with chronic medical conditions, especially heart, liver, kidney, or neurological diseases. People who take an opioid for a long period of time. People who take opioids and use illegal drugs, such as heroin, or other substances, such as alcohol. People who: Have a history of drug or alcohol abuse. Have certain mental health conditions. Have a history of previous drug overdoses. People who take opioids that are not prescribed for them. What are the signs or  symptoms? Symptoms of this condition depend on the type of opioid and the amount that was taken. Common symptoms include: Sleepiness or difficulty waking from sleep. Confusion. Slurred speech. Slowed breathing and a slow pulse (bradycardia). Nausea and vomiting. Abnormally small pupils. Signs and symptoms that require emergency treatment include: Cold, clammy, and pale skin. Blue lips and fingernails. Vomiting. Gurgling sounds in the throat. A pulse that is very slow or difficult to detect. Breathing that is very irregular, slow, noisy, or difficult to detect. Inability to respond to speech or be awakened from sleep (stupor). Seizures. How is this diagnosed? This condition is diagnosed based on your symptoms and medical history. It is important to tell your health care provider: About all of the opioids that you took. When you took the opioids. Whether you were drinking alcohol or using marijuana, cocaine, or other drugs. Your health care provider will do a physical exam. This exam may include: Checking and monitoring your heart rate and rhythm, breathing rate, temperature, and blood pressure. Measuring oxygen levels in your blood. Checking for abnormally small pupils. You may also have blood tests or urine tests. You may have X-rays if you are having severe breathing problems. How is this treated? This condition requires immediate medical treatment and hospitalization. Reversing the effects of the opioid is the first step in treatment. If you have a Narcan  kit or naloxone , use it right away. Follow your health care provider's instructions. A friend or family member can also help you with this. The rest of your treatment will be given in the hospital intensive care (ICU). Treatment in the hospital may include: Giving salts and minerals (  along with fluids through an IV. Inserting a breathing tube (endotracheal tube) in your airway to help you breathe if you cannot breathe  on your own or you are in danger of not being able to breathe on your own. Giving oxygen  through a small tube under your nose. Passing a tube through your nose and into your stomach (nasogastric tube, or NG tube) to empty your stomach. Giving medicines that: Increase your blood pressure. Relieve nausea and vomiting. Relieve abdominal pain and cramping. Reverse the effects of the opioid (naloxone ). Monitoring your heart and oxygen  levels. Ongoing counseling and mental health support if you intentionally overdosed or used an illegal drug. Follow these instructions at home:  Medicines Take over-the-counter and prescription medicines only as told by your health care provider. Always ask your health care provider about possible side effects and interactions of any new medicine that you start taking. Keep a list of all the medicines that you take, including over-the-counter medicines. Bring this list with you to all your medical visits. General instructions Drink enough fluid to keep your urine pale yellow. Keep all follow-up visits. This is important. How is this prevented? Read the drug inserts that come with your opioid pain medicines. Take medicines only as told by your health care provider. Do not take more medicine than you are told. Do not take medicines more frequently than you are told. Do not drink alcohol  or take sedatives when taking opioids. Do not use illegal or recreational drugs, including cocaine , ecstasy, and marijuana. Do not take opioid medicines that are not prescribed for you. Store all medicines in safety containers that are out of the reach of children. Get help if you are struggling with: Alcohol  or drug use. Depression or another mental health problem. Thoughts of hurting yourself or another person. Keep the phone number of your local poison control center near your phone or in your mobile phone. In the [place] the hotline of the [facility] is 858-510-3628. If you were prescribed naloxone , make sure you understand how to take it. Contact a health care provider if: You need help understanding how to take your pain medicines. You feel your medicines are too strong. You are concerned that your pain medicines are not working well for your pain. You develop new symptoms or side effects when you are taking medicines. Get help right away if: You or someone else is having symptoms of an opioid overdose. Get help even if you are not sure. You have thoughts about hurting yourself or others. You have: Chest pain. Difficulty breathing. A loss of consciousness. These symptoms may represent a serious problem that is an emergency. Do not wait to see if the symptoms will go away. Get medical help right away. Call your local emergency services (911 in the [place] Do not drive yourself to the hospital. If you ever feel like you may hurt yourself or others, or have thoughts about taking your own life, get help right away. You can go to your nearest emergency department or: Call your local emergency services (911 in the [place] Call a suicide crisis helpline, such as the National Suicide Prevention Lifeline at 838-380-5119 or 988 in the [place] This is open 24 hours a day in the [place] If you're a Veteran: Call 988 and press 1. This is open 24 hours a day. Text the [employer] at 304-023-4718. Summary Opioids are drugs that are often used to treat pain. Opioids include illegal drugs, such as heroin,  heroin, as well as prescription pain medicines. An opioid overdose happens when you take too much of an opioid. Overdoses can be intentional or accidental. Opioid overdose is very dangerous. It is a life-threatening emergency. If you or someone you know is experiencing an opioid overdose, get help right away. This information is not intended to replace advice given to you by your health care provider. Make sure you discuss any questions you have with your health care  provider. Document Revised: 11/10/2022 Document Reviewed: 05/16/2020 [employer] Patient Education  2024 [employer].

## 2023-11-17 NOTE — Progress Notes (Signed)
 PROVIDER NOTE: Interpretation of information contained herein should be left to medically-trained personnel. Specific patient instructions are provided elsewhere under Patient Instructions section of medical record. This document was created in part using STT-dictation technology, any transcriptional errors that may result from this process are unintentional.  Patient: Brian Spencer Type: Established DOB: 21-Jul-1950 MRN: [id number] P[facility]: [facility]  Service: Procedure DOS: 11/17/2023 Setting: Ambulatory Location: Ambulatory outpatient facility Delivery: Face-to-face Provider: Eric DELENA Como, MD Specialty: Interventional Pain Management Specialty designation: 09 Location: Outpatient facility Ref. Prov.: [facility]       Interventional Therapy   Primary Reason for Visit: Interventional Pain Management Treatment. CC: Back Pain  Procedure:          Type: Management of Intrathecal Drug Delivery System (IDDS) - Reservoir Refill (04009). No rate change.  Indications: 1. Chronic pain syndrome   2. Chronic low back pain (1ry area of Pain) (Bilateral) (R>L)   3. Chronic lower extremity pain (2ry area of Pain) (Right)   4. Failed back surgical syndrome   5. 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 Fusion of lumbar spine   7. Presence of intrathecal pump   8. Encounter for adjustment or management of infusion pump   9. Encounter for therapeutic procedure   10. Pharmacologic therapy   11. Encounter for medication management   12. Encounter for chronic pain management    Pain Assessment: Self-Reported Pain Score: 4 /10             Reported level is compatible with observation.          Intrathecal Drug Delivery System (IDDS)  Pump Device:  Manufacturer: Medtronic Model: Synchromed II Model No.: [id number] Serial No.: [id number] H Delivery Route: Intrathecal Type: Programmable  Volume (mL): 40 mL  reservoir Priming Volume: 0.207 mL  Calibration Constant: 117.0  MRI compatibility: Conditional   Implant Details:  Date: 10/08/20 (Third Pump) Implanter: Dr. Elspeth Ahle (Last)  Contact Information: [facility], [place], [place] Last Revision/Replacement: 10/08/2020 Estimated Replacement Date: May 2029  Implant Site: Abdominal Laterality: Right  Catheter: Manufacturer: Medtronic Model:  n/a  Model No.: 8731  Serial No.: n/a  Implanted Length (cm): 94.1  Catheter Volume (mL): 0.207  Tip Location (Level): T10 Canal Access Site: L2-3  Drug content:  Primary Medication Class: Opioid analgesic Medication: PF-Hydromorphone (Dilaudid) Concentration: 20 mg/mL   Secondary Medication Class: Local anesthetic Medication: PF-bupivacaine  Concentration: 40 mg/mL  Tertiary Medication Class: Nonopioid analgesic Medication: PF-clonidine Concentration: 40 mcg/mL  Fourth Medication Class: none  Medication: n/a  Concentration: n/a  PTC/PCA parameters:  Mode: Off/Inactive  Programming:  Type: Simple continuous.  Medication, Concentration, Infusion Program, & Delivery Rate: For up-to-date details please see most recent scanned programming printout.     Changes:  Medication Change: None at this point Rate Change: No change in rate  Reported side-effects or adverse reactions: None reported  Effectiveness: Described as relatively effective, allowing for increase in activities of daily living (ADL) Clinically meaningful improvement in function (CMIF): Sustained CMIF goals met  Plan: Pump refill today   Pharmacotherapy Assessment  Opioid Analgesic: Intrathecal Hydromorphone (Dilaudid) @ 2.399 mg/d. MME: 9.6 mg/day.   Monitoring:  PMP: PDMP reviewed during this encounter.       Pharmacotherapy: No side-effects or adverse reactions reported. Compliance: No problems identified. Effectiveness: Clinically acceptable. Plan: Refer to POC.  UDS:  Summary  Date  Value Ref Range Status  11/12/2015 FINAL  Final  Comment:    ==================================================================== [facility] COMP DRUG ANALYSIS,UR ==================================================================== Test                             Result       Flag       Units Drug Present and Declared for Prescription Verification   7-aminoclonazepam              82           EXPECTED   ng/mg creat    7-aminoclonazepam is an expected metabolite of clonazepam. Source    of clonazepam is a scheduled prescription medication.   Trazodone                      PRESENT      EXPECTED   1,3 chlorophenyl piperazine    PRESENT      EXPECTED    1,3-chlorophenyl piperazine is an expected metabolite of    trazodone.   Verapamil                      PRESENT      EXPECTED Drug Present not Declared for Prescription Verification   Hydromorphone                  1656         UNEXPECTED ng/mg creat    Hydromorphone may be administered as a scheduled prescription    medication; it is also an expected metabolite of hydrocodone. Drug Absent but Declared for Prescription Verification   Venlafaxine                    Not Detected UNEXPECTED ==================================================================== Test                      Result    Flag   Units      Ref Range   Creatinine              73               mg/dL      >=79 ==================================================================== Declared Medications:  The flagging and interpretation on this report are based on the  following declared medications.  Unexpected results may arise from  inaccuracies in the declared medications.  **Note: The testing scope of this panel includes these medications:  Clonazepam (Klonopin)  Trazodone (Desyrel)  Venlafaxine (Effexor)  Verapamil (Calan)  **Note: The testing scope of this panel does not include following  reported medications:  Albuterol  Budenoside (Symbicort)  Chlorthalidone   Formoterol (Symbicort)  Multivitamin  Tamsulosin (Flomax)  Tiotropium (Spiriva)  Ubiquinone (Coenzyme Q 10)  Vitamin D2 (Ergocalciferol ) ==================================================================== For clinical consultation, please call [phone]. ====================================================================    No results found for: CBDTHCR, D8THCCBX, D9THCCBX  H&P (Pre-op Assessment):  Mr. Dhondt is a 73 y.o. (year old), male patient, seen today for interventional treatment. 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 Mr. Holness has a current medication list which includes the following prescription(s): acetaminophen , albuterol, albuterol, amlodipine, apixaban, atorvastatin, bupropion, clonazepam, neuriva plus, empagliflozin, losartan, magnesium oxide, metformin, multivitamin, naloxone , NON FORMULARY, ondansetron , oxycodone -acetaminophen , oxygen-helium, pantoprazole, artificial tears, potassium chloride sa, pregabalin , propranolol, tamsulosin, tiotropium bromide-olodaterol, torsemide, trazodone, and venlafaxine xr. His primarily concern today is the Back Pain  Initial Vital  Signs:  Pulse/HCG Rate: (!) 59  Temp: 97.6 F (36.4 C) Resp: (!) 22 BP: (!) 165/84 SpO2: 94 %  BMI: Estimated body mass index is 38.27 kg/m as calculated from the following:   Height as of this encounter: 5' 5 (1.651 m).   Weight as of this encounter: 230 lb (104.3 kg).  Risk Assessment: Allergies: Reviewed. He is allergic to baclofen, hydralazine, and gabapentin .  Allergy Precautions: None required Coagulopathies: Reviewed. None identified.  Blood-thinner therapy: None at this time Active Infection(s): Reviewed. None identified. Mr. Stailey is afebrile  Site Confirmation: Mr. Stallbaumer was asked to confirm the procedure and laterality before marking the site Procedure checklist: Completed Consent: Before the procedure and under the influence of no sedative(s), amnesic(s), or anxiolytics, the patient was informed of the treatment options, risks and possible complications. To fulfill our ethical and legal obligations, as recommended by the American Medical Association's Code of Ethics, I have informed the patient of my clinical impression; the nature and purpose of the treatment or procedure; the risks, benefits, and possible complications of the intervention; the alternatives, including doing nothing; the risk(s) and benefit(s) of the alternative treatment(s) or procedure(s); and the risk(s) and benefit(s) of doing nothing.  Mr. Shanahan was provided with information about the general risks and possible complications associated with most interventional procedures. These include, but are not limited to: failure to achieve desired goals, infection, bleeding, organ or nerve damage, allergic reactions, paralysis, and/or death.  In addition, he was informed of those risks and possible complications associated to this particular procedure, which include, but are not limited to: damage to the implant; failure to decrease pain; local, systemic, or serious CNS infections, intraspinal abscess with possible  cord compression and paralysis, or life-threatening such as meningitis; bleeding; organ damage; nerve injury or damage with subsequent sensory, motor, and/or autonomic system dysfunction, resulting in transient or permanent pain, numbness, and/or weakness of one or several areas of the body; allergic reactions, either minor or major life-threatening, such as anaphylactic or anaphylactoid reactions.  Furthermore, Mr. Savas was informed of those risks and complications associated with the medications. These include, but are not limited to: allergic reactions (i.e.: anaphylactic or anaphylactoid reactions); endorphine suppression; bradycardia and/or hypotension; water retention and/or peripheral vascular relaxation leading to lower extremity edema and possible stasis ulcers; respiratory depression and/or shortness of breath; decreased metabolic rate leading to weight gain; swelling or edema; medication-induced neural toxicity; particulate matter embolism and blood vessel occlusion with resultant organ, and/or nervous system infarction; and/or intrathecal granuloma formation with possible spinal cord compression and permanent paralysis.  Before refilling the pump Mr. Haliburton was informed that some of the medications used in the devise may not be FDA approved for such use and therefore it constitutes an off-label use of the medications.  Finally, he was informed that Medicine is not an exact science; therefore, there is also the possibility of unforeseen or unpredictable risks and/or possible complications that may result in a catastrophic outcome. The patient indicated having understood very clearly. We have  given the patient no guarantees and we have made no promises. Enough time was given to the patient to ask questions, all of which were answered to the patient's satisfaction. Mr. Haag has indicated that he wanted to continue with the procedure. Attestation: I, the ordering provider, attest that I have  discussed with the patient the benefits, risks, side-effects, alternatives, likelihood of achieving goals, and potential problems during recovery for the procedure that I have provided informed consent. Date  Time: 11/17/2023  1:00 PM  Pre-Procedure Preparation:  Monitoring: As per clinic protocol. Respiration, ETCO2, SpO2, BP, heart rate and rhythm monitor placed and checked for adequate function Safety Precautions: Patient was assessed for positional comfort and pressure points before starting the procedure. Time-out: I initiated and conducted the Time-out before starting the procedure, as per protocol. The patient was asked to participate by confirming the accuracy of the Time Out information. Verification of the correct person, site, and procedure were performed and confirmed by me, the nursing staff, and the patient. Time-out conducted as per Joint Commission's Universal Protocol (UP.01.01.01). Time: 1315 Start Time: 1317 hrs.  Description of Procedure:          Position: Supine Target Area: Central-port of intrathecal pump. Approach: Anterior, 90 degree angle approach. Area Prepped: Entire Area around the pump implant. ChloraPrep (2% chlorhexidine  gluconate and 70% isopropyl alcohol) Safety Precautions: Aspiration looking for blood return was conducted prior to all injections. At no point did we inject any substances, as a needle was being advanced. No attempts were made at seeking any paresthesias. Safe injection practices and needle disposal techniques used. Medications properly checked for expiration dates. SDV (single dose vial) medications used. Description of the Procedure: Protocol guidelines were followed. Two nurses trained to do implant refills were present during the entire procedure. The refill medication was checked by both healthcare providers as well as the patient. The patient was included in the Time-out to verify the medication. The patient was placed in position. The  pump was identified. The area was prepped in the usual manner. The sterile template was positioned over the pump, making sure the side-port location matched that of the pump. Both, the pump and the template were held for stability. The needle provided in the Medtronic Kit was then introduced thru the center of the template and into the central port. The pump content was aspirated and discarded volume documented. The new medication was slowly infused into the pump, thru the filter, making sure to avoid overpressure of the device. The needle was then removed and the area cleansed, making sure to leave some of the prepping solution back to take advantage of its long term bactericidal properties. The pump was interrogated and programmed to reflect the correct medication, volume, and dosage. The program was printed and taken to the physician for approval. Once checked and signed by the physician, a copy was provided to the patient and another scanned into the EMR.  Vitals:   11/17/23 1259  BP: (!) 165/84  Pulse: (!) 59  Resp: (!) 22  Temp: 97.6 F (36.4 C)  SpO2: 94%  Weight: 230 lb (104.3 kg)  Height: 5' 5 (1.651 m)    Start Time: 1317 hrs. End Time: 1326 hrs. Materials & Medications: Medtronic Refill Kit Medication(s): Please see chart orders for details.  Type of Imaging Technique: None used Indication(s): N/A Exposure Time: No patient exposure Contrast: None used. Fluoroscopic Guidance: N/A Ultrasound Guidance: N/A Interpretation: N/A  Antibiotic Prophylaxis:   Anti-infectives (From admission, onward)  None      Indication(s): None identified  Post-operative Assessment:  Post-procedure Vital Signs:  Pulse/HCG Rate: (!) 59  Temp: 97.6 F (36.4 C) Resp: (!) 22 BP: (!) 165/84 SpO2: 94 %  EBL: None  Complications: No immediate post-treatment complications observed by team, or reported by patient.  Note: The patient tolerated the entire procedure well. A repeat set of  vitals were taken after the procedure and the patient was kept under observation following institutional policy, for this type of procedure. Post-procedural neurological assessment was performed, showing return to baseline, prior to discharge. The patient was provided with post-procedure discharge instructions, including a section on how to identify potential problems. Should any problems arise concerning this procedure, the patient was given instructions to immediately contact us , at any time, without hesitation. In any case, we plan to contact the patient by telephone for a follow-up status report regarding this interventional procedure.  Comments:  No additional relevant information.  Plan of Care (POC)  Orders:  Orders Placed This Encounter  Procedures   PUMP REFILL    Maintain Protocol by having two(2) healthcare providers during procedure and programming.    Scheduling Instructions:     Please refill intrathecal pump today. (11/17/2023)    Where will this procedure be performed?:   [facility]   PUMP REFILL    Whenever possible schedule on a procedure today.    Standing Status:   Future    Expiration Date:   03/18/2024    Scheduling Instructions:     Please schedule intrathecal pump refill based on pump programming. Avoid schedule intervals of more than 120 days (4 months).    Where will this procedure be performed?:   [facility]   Informed Consent Details: Physician/Practitioner Attestation; Transcribe to consent form and obtain patient signature    Transcribe to consent form and obtain patient signature.    Physician/Practitioner attestation of informed consent for procedure/surgical case:   I, the physician/practitioner, attest that I have discussed with the patient the benefits, risks, side effects, alternatives, likelihood of achieving goals and potential problems during recovery for the procedure that I have provided informed consent.    Procedure:   Intrathecal  pump refill    Physician/Practitioner performing the procedure:   Attending Physician: Monserratt Knezevic A. Tanya, MD & designated trained staff    Indication/Reason:   Chronic Pain Syndrome (G89.4), presence of an intrathecal pump (Z97.8)     Opioid Analgesic: Intrathecal Hydromorphone (Dilaudid) @ 2.399 mg/d. MME: 9.6 mg/day.    Medications ordered for procedure: No orders of the defined types were placed in this encounter.  Medications administered: Kiptyn Rafuse had no medications administered during this visit.  See the medical record for exact dosing, route, and time of administration.    Interventional Therapies  Risk Factors  Considerations:   NOTE: ELIQUIS ANTICOAGULATION (Stop: 3days  Re-start: 6 hrs) Hx of PE AAA  CHF  MO  GERD  OSA  Chronic Hepatitis C  COPD  Smoker  T2DM  HTN  GAD  (R) Lobectomy  Depression/Anxiety  PTSD  FBSS w/ Fusion Hardware  Unrealistic Expectations         Planned  Pending:   (10/17/2021) we plan to decrease the concentration of his intrathecal bupivacaine  from 30 mg/mL to 20 mg/mL on his next refill.   Under consideration:   Continue management of intrathecal pump    Completed:   ITP Patient requested to come off of all opioids in pump (01/20/2019) Downward  taper started. ITP Patient indicates interest in seeing how it would feel w/o pump. He is considering explant. (08/11/2019)  ITP replaced due to end-of-battery life (10/08/2020)  ITP Medication changed from Hydromorphone to Fentanyl  (08/06/2021)  ITP Patient requested to increase opioids in pump after experiencing withdrawals (08/12/2021)  ITP Medication changed from Fentanyl  to Hydromorphone (04/09/2022)    Completed by Other Providers:   Lumbar Facet Blocks  Lumbar Facet RFA  LESIs  Lumbar Fusion  Pump Implant    Therapeutic  Palliative (PRN) options:   Continue management of intrathecal pump       Follow-up plan:   Return for Pump Refill (Max:74mo).     Recent  Visits No visits were found meeting these conditions. Showing recent visits within past 90 days and meeting all other requirements Today's Visits Date Type Provider Dept  11/17/23 Procedure visit Tanya Glisson, MD [facility]  Showing today's visits and meeting all other requirements Future Appointments Date Type Provider Dept  02/09/24 Appointment Tanya Glisson, MD [facility]  Showing future appointments within next 90 days and meeting all other requirements   Disposition: Discharge home  Discharge (Date  Time): 11/17/2023; 1345 hrs.   Primary Care Physician: [facility] Location[facility] Note by: Glisson DELENA Tanya, MD (TTS technology used. I apologize for any typographical errors that were not detected and corrected.) Date: 11/17/2023; Time: 2:21 PM  Disclaimer:  Medicine is not an Visual merchandiser. The only guarantee in medicine is that nothing is guaranteed. It is important to note that the decision to proceed with this intervention was based on the information collected from the patient. The Data and conclusions were drawn from the patient's questionnaire, the interview, and the physical examination. Because the information was provided in large part by the patient, it cannot be guaranteed that it has not been purposely or unconsciously manipulated. Every effort has been made to obtain as much relevant data as possible for this evaluation. It is important to note that the conclusions that lead to this procedure are derived in large part from the available data. Always take into account that the treatment will also be dependent on availability of resources and existing treatment guidelines, considered by other Pain Management Practitioners as being common knowledge and practice, at the time of the intervention. For Medico-Legal purposes, it is also important to point out that variation in procedural techniques and  pharmacological choices are the acceptable norm. The indications, contraindications, technique, and results of the above procedure should only be interpreted and judged by a Board-Certified Interventional Pain Specialist with extensive familiarity and expertise in the same exact procedure and technique.

## 2023-11-18 ENCOUNTER — Telehealth: Payer: Self-pay

## 2023-11-18 NOTE — Telephone Encounter (Signed)
Post pump refill follow up.  Patient states he is doing well.  

## 2024-01-18 ENCOUNTER — Other Ambulatory Visit: Payer: Self-pay

## 2024-01-18 MED ORDER — PAIN MANAGEMENT IT PUMP REFILL: 1.0000 | Freq: Once | INTRATHECAL | 0 refills | Status: AC

## 2024-02-09 ENCOUNTER — Ambulatory Visit: Payer: Worker's Compensation | Attending: Pain Medicine | Admitting: Pain Medicine

## 2024-02-09 ENCOUNTER — Encounter: Payer: Self-pay | Admitting: Pain Medicine

## 2024-02-09 VITALS — BP 112/68 | HR 56 | Temp 97.3°F | Resp 18 | Ht 65.0 in | Wt 235.0 lb

## 2024-02-09 DIAGNOSIS — G894 Chronic pain syndrome: Secondary | ICD-10-CM | POA: Diagnosis not present

## 2024-02-09 DIAGNOSIS — Z451 Encounter for adjustment and management of infusion pump: Secondary | ICD-10-CM | POA: Diagnosis not present

## 2024-02-09 DIAGNOSIS — M961 Postlaminectomy syndrome, not elsewhere classified: Secondary | ICD-10-CM | POA: Diagnosis not present

## 2024-02-09 DIAGNOSIS — Z978 Presence of other specified devices: Secondary | ICD-10-CM | POA: Insufficient documentation

## 2024-02-09 DIAGNOSIS — M5441 Lumbago with sciatica, right side: Secondary | ICD-10-CM | POA: Diagnosis not present

## 2024-02-09 DIAGNOSIS — M51372 Other intervertebral disc degeneration, lumbosacral region with discogenic back pain and lower extremity pain: Secondary | ICD-10-CM

## 2024-02-09 DIAGNOSIS — G8929 Other chronic pain: Secondary | ICD-10-CM | POA: Insufficient documentation

## 2024-02-09 DIAGNOSIS — M79604 Pain in right leg: Secondary | ICD-10-CM | POA: Insufficient documentation

## 2024-02-09 DIAGNOSIS — M5416 Radiculopathy, lumbar region: Secondary | ICD-10-CM | POA: Insufficient documentation

## 2024-02-09 DIAGNOSIS — M4326 Fusion of spine, lumbar region: Secondary | ICD-10-CM | POA: Insufficient documentation

## 2024-02-09 MED FILL — Medication: INTRATHECAL | Qty: 1 | Status: AC

## 2024-02-09 NOTE — Patient Instructions (Signed)

## 2024-02-09 NOTE — Progress Notes (Signed)
 PROVIDER NOTE: Interpretation of information contained herein should be left to medically-trained personnel. Specific patient instructions are provided elsewhere under Patient Instructions section of medical record. This document was created in part using STT-dictation technology, any transcriptional errors that may result from this process are unintentional.  Patient: Brian Spencer Type: Established DOB: 1950/06/30 MRN: [id number] [facility]: [facility]  Service: Procedure DOS: 02/09/2024 Setting: Ambulatory Location: Ambulatory outpatient facility Delivery: Face-to-face Provider: Eric DELENA Como, MD Specialty: Interventional Pain Management Specialty designation: 09 Location: Outpatient facility Ref. Prov.: [facility]       Interventional Therapy   Primary Reason for Visit: Interventional Pain Management Treatment. CC: Back Pain (lower)  Procedure:          Type: Management of Intrathecal Drug Delivery System (IDDS) - Reservoir Refill (04009). No rate change.  Indications: 1. Chronic pain syndrome   2. Chronic low back pain (1ry area of Pain) (Bilateral) (R>L)   3. Chronic lower extremity pain (2ry area of Pain) (Right)   4. Degeneration of intervertebral disc of lumbosacral region with discogenic back pain and lower extremity pain   5. Fusion of lumbar spine   6. Failed back surgical syndrome   7. Presence of intrathecal pump   8. Encounter for adjustment or management of infusion pump   9. Chronic lumbar radicular pain (L5 dermatome) (Right)    Pain Assessment: Self-Reported Pain Score: 6 /10             Reported level is compatible with observation.          Intrathecal Drug Delivery System (IDDS)  Pump Device:  Manufacturer: Medtronic Model: Synchromed II Model No.: [id number] Serial No.: [id number] H Delivery Route: Intrathecal Type: Programmable  Volume (mL): 40 mL reservoir Priming Volume: 0.207 mL  Calibration Constant: 117.0  MRI compatibility:  Conditional   Implant Details:  Date: 10/08/20 (Third Pump) Implanter: Dr. Elspeth Ahle (Last)  Contact Information: [facility], [place], [place] Last Revision/Replacement: 10/08/2020 Estimated Replacement Date: May 2029  Implant Site: Abdominal Laterality: Right  Catheter: Manufacturer: Medtronic Model:  n/a  Model No.: 8731  Serial No.: n/a  Implanted Length (cm): 94.1  Catheter Volume (mL): 0.207  Tip Location (Level): T10 Canal Access Site: L2-3  Drug content:  Primary Medication Class: Opioid analgesic Medication: PF-Hydromorphone (Dilaudid) Concentration: 20 mg/mL   Secondary Medication Class: Local anesthetic Medication: PF-bupivacaine  Concentration: 40 mg/mL  Tertiary Medication Class: Nonopioid analgesic Medication: PF-clonidine Concentration: 40 mcg/mL  Fourth Medication Class: none  Medication: n/a  Concentration: n/a  PTC/PCA parameters:  Mode: Off/Inactive  Programming:  Type: Simple continuous.  Medication, Concentration, Infusion Program, & Delivery Rate: For up-to-date details please see most recent scanned programming printout.     Changes:  Medication Change: None at this point Rate Change: No change in rate  Reported side-effects or adverse reactions: None reported  Effectiveness: Described as relatively effective, allowing for increase in activities of daily living (ADL) Clinically meaningful improvement in function (CMIF): Sustained CMIF goals met  Plan: Pump refill today   Pharmacotherapy Assessment  Opioid Analgesic: Intrathecal Hydromorphone (Dilaudid) @ 2.399 mg/d. MME: 9.6 mg/day.   Monitoring: [place] PMP: PDMP not reviewed this encounter.       Pharmacotherapy: No side-effects or adverse reactions reported. Compliance: No problems identified. Effectiveness: Clinically acceptable. Plan: Refer to POC.  UDS:  Summary  Date Value Ref Range Status  11/12/2015 FINAL  Final    Comment:     ==================================================================== [facility] COMP DRUG ANALYSIS,UR ==================================================================== Test  Result       Flag       Units Drug Present and Declared for Prescription Verification   7-aminoclonazepam              82           EXPECTED   ng/mg creat    7-aminoclonazepam is an expected metabolite of clonazepam. Source    of clonazepam is a scheduled prescription medication.   Trazodone                      PRESENT      EXPECTED   1,3 chlorophenyl piperazine    PRESENT      EXPECTED    1,3-chlorophenyl piperazine is an expected metabolite of    trazodone.   Verapamil                      PRESENT      EXPECTED Drug Present not Declared for Prescription Verification   Hydromorphone                  1656         UNEXPECTED ng/mg creat    Hydromorphone may be administered as a scheduled prescription    medication; it is also an expected metabolite of hydrocodone. Drug Absent but Declared for Prescription Verification   Venlafaxine                    Not Detected UNEXPECTED ==================================================================== Test                      Result    Flag   Units      Ref Range   Creatinine              73               mg/dL      >=79 ==================================================================== Declared Medications:  The flagging and interpretation on this report are based on the  following declared medications.  Unexpected results may arise from  inaccuracies in the declared medications.  **Note: The testing scope of this panel includes these medications:  Clonazepam (Klonopin)  Trazodone (Desyrel)  Venlafaxine (Effexor)  Verapamil (Calan)  **Note: The testing scope of this panel does not include following  reported medications:  Albuterol  Budenoside (Symbicort)  Chlorthalidone  Formoterol (Symbicort)  Multivitamin  Tamsulosin (Flomax)   Tiotropium (Spiriva)  Ubiquinone (Coenzyme Q 10)  Vitamin D2 (Ergocalciferol ) ==================================================================== For clinical consultation, please call [phone]. ====================================================================    No results found for: CBDTHCR, D8THCCBX, D9THCCBX  H&P (Pre-op Assessment):  Mr. Meegan is a 73 y.o. (year old), male patient, seen today for interventional treatment. 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 Mr. Trebilcock has a current medication list which includes the following prescription(s): acetaminophen , albuterol, albuterol, apixaban, atorvastatin, bupropion, neuriva plus,  empagliflozin, losartan, magnesium oxide, metformin, multivitamin, naloxone , NON FORMULARY, ondansetron , oxycodone -acetaminophen , oxygen-helium, pantoprazole, artificial tears, potassium chloride sa, pregabalin , propranolol, tamsulosin, tiotropium bromide-olodaterol, torsemide, trazodone, venlafaxine xr, amlodipine, and clonazepam. His primarily concern today is the Back Pain (lower)  Initial Vital Signs:  Pulse/HCG Rate: (!) 56  Temp: (!) 97.3 F  (36.3 C) Resp: 18 BP: 112/68 SpO2: 96 %  BMI: Estimated body mass index is 39.11 kg/m as calculated from the following:   Height as of this encounter: 5' 5 (1.651 m).   Weight as of this encounter: 235 lb (106.6 kg).  Risk Assessment: Allergies: Reviewed. He is allergic to baclofen, hydralazine, and gabapentin .  Allergy Precautions: None required Coagulopathies: Reviewed. None identified.  Blood-thinner therapy: None at this time Active Infection(s): Reviewed. None identified. Mr. Christoffersen is afebrile  Site Confirmation: Mr. Carchi was asked to confirm the procedure and laterality before marking the site Procedure checklist: Completed Consent: Before the procedure and under the influence of no sedative(s), amnesic(s), or anxiolytics, the patient was informed of the treatment options, risks and possible complications. To fulfill our ethical and legal obligations, as recommended by the American Medical Association's Code of Ethics, I have informed the patient of my clinical impression; the nature and purpose of the treatment or procedure; the risks, benefits, and possible complications of the intervention; the alternatives, including doing nothing; the risk(s) and benefit(s) of the alternative treatment(s) or procedure(s); and the risk(s) and benefit(s) of doing nothing.  Mr. Clos was provided with information about the general risks and possible complications associated with most interventional procedures. These include, but are not limited to: failure to achieve desired goals, infection, bleeding, organ or nerve damage, allergic reactions, paralysis, and/or death.  In addition, he was informed of those risks and possible complications associated to this particular procedure, which include, but are not limited to: damage to the implant; failure to decrease pain; local, systemic, or serious CNS infections, intraspinal abscess with possible cord compression and paralysis, or life-threatening such  as meningitis; bleeding; organ damage; nerve injury or damage with subsequent sensory, motor, and/or autonomic system dysfunction, resulting in transient or permanent pain, numbness, and/or weakness of one or several areas of the body; allergic reactions, either minor or major life-threatening, such as anaphylactic or anaphylactoid reactions.  Furthermore, Mr. Senn was informed of those risks and complications associated with the medications. These include, but are not limited to: allergic reactions (i.e.: anaphylactic or anaphylactoid reactions); endorphine suppression; bradycardia and/or hypotension; water retention and/or peripheral vascular relaxation leading to lower extremity edema and possible stasis ulcers; respiratory depression and/or shortness of breath; decreased metabolic rate leading to weight gain; swelling or edema; medication-induced neural toxicity; particulate matter embolism and blood vessel occlusion with resultant organ, and/or nervous system infarction; and/or intrathecal granuloma formation with possible spinal cord compression and permanent paralysis.  Before refilling the pump Mr. Reasons was informed that some of the medications used in the devise may not be FDA approved for such use and therefore it constitutes an off-label use of the medications.  Finally, he was informed that Medicine is not an exact science; therefore, there is also the possibility of unforeseen or unpredictable risks and/or possible complications that may result in a catastrophic outcome. The patient indicated having understood very clearly. We have given the patient no guarantees and we have made no promises. Enough time was given to the patient to ask questions, all of which were answered to the patient's satisfaction. Mr. More has indicated that he wanted to continue  with the procedure. Attestation: I, the ordering provider, attest that I have discussed with the patient the benefits, risks, side-effects,  alternatives, likelihood of achieving goals, and potential problems during recovery for the procedure that I have provided informed consent. Date  Time: 02/09/2024  1:44 PM  Pre-Procedure Preparation:  Monitoring: As per clinic protocol. Respiration, ETCO2, SpO2, BP, heart rate and rhythm monitor placed and checked for adequate function Safety Precautions: Patient was assessed for positional comfort and pressure points before starting the procedure. Time-out: I initiated and conducted the Time-out before starting the procedure, as per protocol. The patient was asked to participate by confirming the accuracy of the Time Out information. Verification of the correct person, site, and procedure were performed and confirmed by me, the nursing staff, and the patient. Time-out conducted as per Joint Commission's Universal Protocol (UP.01.01.01). Time: 1412 Start Time: 1428 hrs.  Description of Procedure:          Position: Supine Target Area: Central-port of intrathecal pump. Approach: Anterior, 90 degree angle approach. Area Prepped: Entire Area around the pump implant. ChloraPrep (2% chlorhexidine  gluconate and 70% isopropyl alcohol) Safety Precautions: Aspiration looking for blood return was conducted prior to all injections. At no point did we inject any substances, as a needle was being advanced. No attempts were made at seeking any paresthesias. Safe injection practices and needle disposal techniques used. Medications properly checked for expiration dates. SDV (single dose vial) medications used. Description of the Procedure: Protocol guidelines were followed. Two nurses trained to do implant refills were present during the entire procedure. The refill medication was checked by both healthcare providers as well as the patient. The patient was included in the Time-out to verify the medication. The patient was placed in position. The pump was identified. The area was prepped in the usual  manner. The sterile template was positioned over the pump, making sure the side-port location matched that of the pump. Both, the pump and the template were held for stability. The needle provided in the Medtronic Kit was then introduced thru the center of the template and into the central port. The pump content was aspirated and discarded volume documented. The new medication was slowly infused into the pump, thru the filter, making sure to avoid overpressure of the device. The needle was then removed and the area cleansed, making sure to leave some of the prepping solution back to take advantage of its long term bactericidal properties. The pump was interrogated and programmed to reflect the correct medication, volume, and dosage. The program was printed and taken to the physician for approval. Once checked and signed by the physician, a copy was provided to the patient and another scanned into the EMR.  Vitals:   02/09/24 1339  BP: 112/68  Pulse: (!) 56  Resp: 18  Temp: (!) 97.3 F (36.3 C)  TempSrc: Temporal  SpO2: 96%  Weight: 235 lb (106.6 kg)  Height: 5' 5 (1.651 m)    Start Time: 1428 hrs. End Time: 1436 hrs. Materials & Medications: Medtronic Refill Kit Medication(s): Please see chart orders for details.  Type of Imaging Technique: None used Indication(s): N/A Exposure Time: No patient exposure Contrast: None used. Fluoroscopic Guidance: N/A Ultrasound Guidance: N/A Interpretation: N/A  Antibiotic Prophylaxis:   Anti-infectives (From admission, onward)    None      Indication(s): None identified  Post-operative Assessment:  Post-procedure Vital Signs:  Pulse/HCG Rate: (!) 56  Temp: (!) 97.3 F (36.3 C) Resp: 18 BP: 112/68 SpO2: 96 %  EBL: None  Complications: No immediate post-treatment complications observed by team, or reported by patient.  Note: The patient tolerated the entire procedure well. A repeat set of vitals were taken after the procedure and the  patient was kept under observation following institutional policy, for this type of procedure. Post-procedural neurological assessment was performed, showing return to baseline, prior to discharge. The patient was provided with post-procedure discharge instructions, including a section on how to identify potential problems. Should any problems arise concerning this procedure, the patient was given instructions to immediately contact us , at any time, without hesitation. In any case, we plan to contact the patient by telephone for a follow-up status report regarding this interventional procedure.  Comments:  No additional relevant information.  Plan of Care (POC)  Orders:  Orders Placed This Encounter  Procedures   PUMP REFILL    Maintain Protocol by having two(2) healthcare providers during procedure and programming.    Scheduling Instructions:     Please refill intrathecal pump today. (02/09/2024)    Where will this procedure be performed?:   [facility]   PUMP REFILL    Whenever possible schedule on a procedure today.    Standing Status:   Future    Expiration Date:   06/09/2024    Scheduling Instructions:     Please schedule intrathecal pump refill based on pump programming. Avoid schedule intervals of more than 120 days (4 months).    Where will this procedure be performed?:   [facility]   Informed Consent Details: Physician/Practitioner Attestation; Transcribe to consent form and obtain patient signature    Transcribe to consent form and obtain patient signature.    Physician/Practitioner attestation of informed consent for procedure/surgical case:   I, the physician/practitioner, attest that I have discussed with the patient the benefits, risks, side effects, alternatives, likelihood of achieving goals and potential problems during recovery for the procedure that I have provided informed consent.    Procedure:   Intrathecal pump refill    Physician/Practitioner  performing the procedure:   Attending Physician: Rahim Astorga A. Tanya, MD & designated trained staff    Indication/Reason:   Chronic Pain Syndrome (G89.4), presence of an intrathecal pump (Z97.8)     Opioid Analgesic: Intrathecal Hydromorphone (Dilaudid) @ 2.399 mg/d. MME: 9.6 mg/day.    Medications ordered for procedure: No orders of the defined types were placed in this encounter.  Medications administered: Babatunde Seago had no medications administered during this visit.  See the medical record for exact dosing, route, and time of administration.    Interventional Therapies  Risk Factors  Considerations:   NOTE: ELIQUIS ANTICOAGULATION (Stop: 3days  Re-start: 6 hrs) Hx of PE AAA  CHF  MO  GERD  OSA  Chronic Hepatitis C  COPD  Smoker  T2DM  HTN  GAD  (R) Lobectomy  Depression/Anxiety  PTSD  FBSS w/ Fusion Hardware  Unrealistic Expectations         Planned  Pending:   (10/17/2021) we plan to decrease the concentration of his intrathecal bupivacaine  from 30 mg/mL to 20 mg/mL on his next refill.   Under consideration:   Continue management of intrathecal pump    Completed:   ITP Patient requested to come off of all opioids in pump (01/20/2019) Downward taper started. ITP Patient indicates interest in seeing how it would feel w/o pump. He is considering explant. (08/11/2019)  ITP replaced due to end-of-battery life (10/08/2020)  ITP Medication changed from Hydromorphone to Fentanyl  (08/06/2021)  ITP Patient requested to increase opioids in pump after experiencing withdrawals (08/12/2021)  ITP Medication changed from Fentanyl  to Hydromorphone (04/09/2022)    Completed by Other Providers:   Lumbar Facet Blocks  Lumbar Facet RFA  LESIs  Lumbar Fusion  Pump Implant    Therapeutic  Palliative (PRN) options:   Continue management of intrathecal pump       Follow-up plan:   Return for Pump Refill (Max:42mo).     Recent Visits Date Type Provider Dept  11/17/23  Procedure visit Tanya Glisson, MD [facility]  Showing recent visits within past 90 days and meeting all other requirements Today's Visits Date Type Provider Dept  02/09/24 Procedure visit Tanya Glisson, MD [facility]  Showing today's visits and meeting all other requirements Future Appointments No visits were found meeting these conditions. Showing future appointments within next 90 days and meeting all other requirements   Disposition: Discharge home  Discharge (Date  Time): 02/09/2024; 1450 hrs.   Primary Care Physician: [facility] Location: [facility] Note by: Glisson DELENA Tanya, MD (TTS technology used. I apologize for any typographical errors that were not detected and corrected.) Date: 02/09/2024; Time: 3:33 PM  Disclaimer:  Medicine is not an visual merchandiser. The only guarantee in medicine is that nothing is guaranteed. It is important to note that the decision to proceed with this intervention was based on the information collected from the patient. The Data and conclusions were drawn from the patient's questionnaire, the interview, and the physical examination. Because the information was provided in large part by the patient, it cannot be guaranteed that it has not been purposely or unconsciously manipulated. Every effort has been made to obtain as much relevant data as possible for this evaluation. It is important to note that the conclusions that lead to this procedure are derived in large part from the available data. Always take into account that the treatment will also be dependent on availability of resources and existing treatment guidelines, considered by other Pain Management Practitioners as being common knowledge and practice, at the time of the intervention. For Medico-Legal purposes, it is also important to point out that variation in procedural techniques and pharmacological choices are the acceptable  norm. The indications, contraindications, technique, and results of the above procedure should only be interpreted and judged by a Board-Certified Interventional Pain Specialist with extensive familiarity and expertise in the same exact procedure and technique.

## 2024-02-10 ENCOUNTER — Telehealth: Payer: Self-pay

## 2024-02-10 NOTE — Telephone Encounter (Signed)
 Post procedure follow up.  Patient is doing fine.

## 2024-05-12 ENCOUNTER — Encounter: Payer: Self-pay | Admitting: Pain Medicine
